# Patient Record
Sex: Female | Born: 1935 | Race: White | Hispanic: No | Smoking: Never smoker
Health system: Southern US, Community
[De-identification: ages and names within clinical notes are randomized; demographics above are authoritative.]

## PROBLEM LIST (undated history)

## (undated) DIAGNOSIS — G2581 Restless legs syndrome: Secondary | ICD-10-CM

## (undated) DIAGNOSIS — J310 Chronic rhinitis: Secondary | ICD-10-CM

## (undated) DIAGNOSIS — R269 Unspecified abnormalities of gait and mobility: Secondary | ICD-10-CM

## (undated) DIAGNOSIS — D62 Acute posthemorrhagic anemia: Secondary | ICD-10-CM

## (undated) DIAGNOSIS — I872 Venous insufficiency (chronic) (peripheral): Secondary | ICD-10-CM

## (undated) DIAGNOSIS — M810 Age-related osteoporosis without current pathological fracture: Secondary | ICD-10-CM

## (undated) DIAGNOSIS — R413 Other amnesia: Secondary | ICD-10-CM

## (undated) DIAGNOSIS — R209 Unspecified disturbances of skin sensation: Secondary | ICD-10-CM

## (undated) DIAGNOSIS — S2239XA Fracture of one rib, unspecified side, initial encounter for closed fracture: Secondary | ICD-10-CM

## (undated) DIAGNOSIS — R5381 Other malaise: Secondary | ICD-10-CM

## (undated) DIAGNOSIS — S2249XA Multiple fractures of ribs, unspecified side, initial encounter for closed fracture: Secondary | ICD-10-CM

## (undated) DIAGNOSIS — S72009A Fracture of unspecified part of neck of unspecified femur, initial encounter for closed fracture: Secondary | ICD-10-CM

## (undated) DIAGNOSIS — K219 Gastro-esophageal reflux disease without esophagitis: Secondary | ICD-10-CM

## (undated) DIAGNOSIS — R634 Abnormal weight loss: Secondary | ICD-10-CM

## (undated) DIAGNOSIS — R42 Dizziness and giddiness: Secondary | ICD-10-CM

## (undated) DIAGNOSIS — J301 Allergic rhinitis due to pollen: Secondary | ICD-10-CM

## (undated) DIAGNOSIS — D649 Anemia, unspecified: Secondary | ICD-10-CM

## (undated) DIAGNOSIS — Z9181 History of falling: Secondary | ICD-10-CM

## (undated) DIAGNOSIS — R079 Chest pain, unspecified: Secondary | ICD-10-CM

## (undated) DIAGNOSIS — G479 Sleep disorder, unspecified: Secondary | ICD-10-CM

## (undated) DIAGNOSIS — C801 Malignant (primary) neoplasm, unspecified: Secondary | ICD-10-CM

## (undated) DIAGNOSIS — I10 Essential (primary) hypertension: Secondary | ICD-10-CM

## (undated) DIAGNOSIS — M170 Bilateral primary osteoarthritis of knee: Secondary | ICD-10-CM

## (undated) DIAGNOSIS — G609 Hereditary and idiopathic neuropathy, unspecified: Secondary | ICD-10-CM

## (undated) DIAGNOSIS — R32 Unspecified urinary incontinence: Secondary | ICD-10-CM

## (undated) DIAGNOSIS — M1990 Unspecified osteoarthritis, unspecified site: Secondary | ICD-10-CM

## (undated) DIAGNOSIS — S72143A Displaced intertrochanteric fracture of unspecified femur, initial encounter for closed fracture: Secondary | ICD-10-CM

## (undated) DIAGNOSIS — M24549 Contracture, unspecified hand: Secondary | ICD-10-CM

## (undated) DIAGNOSIS — E785 Hyperlipidemia, unspecified: Secondary | ICD-10-CM

## (undated) DIAGNOSIS — K209 Esophagitis, unspecified: Secondary | ICD-10-CM

## (undated) DIAGNOSIS — S90129A Contusion of unspecified lesser toe(s) without damage to nail, initial encounter: Secondary | ICD-10-CM

## (undated) DIAGNOSIS — F329 Major depressive disorder, single episode, unspecified: Secondary | ICD-10-CM

## (undated) DIAGNOSIS — M4120 Other idiopathic scoliosis, site unspecified: Secondary | ICD-10-CM

## (undated) DIAGNOSIS — K2210 Ulcer of esophagus without bleeding: Secondary | ICD-10-CM

## (undated) DIAGNOSIS — K449 Diaphragmatic hernia without obstruction or gangrene: Secondary | ICD-10-CM

## (undated) DIAGNOSIS — C50919 Malignant neoplasm of unspecified site of unspecified female breast: Secondary | ICD-10-CM

## (undated) DIAGNOSIS — I729 Aneurysm of unspecified site: Secondary | ICD-10-CM

## (undated) HISTORY — DX: Ulcer of esophagus without bleeding: K22.10

## (undated) HISTORY — PX: TONSILLECTOMY: SUR1361

## (undated) HISTORY — PX: ESOPHAGOGASTRODUODENOSCOPY ENDOSCOPY: SHX5814

## (undated) HISTORY — DX: Multiple fractures of ribs, unspecified side, initial encounter for closed fracture: S22.49XA

## (undated) HISTORY — DX: Malignant (primary) neoplasm, unspecified: C80.1

## (undated) HISTORY — DX: Unspecified abnormalities of gait and mobility: R26.9

## (undated) HISTORY — PX: APPENDECTOMY: SHX54

## (undated) HISTORY — DX: Contracture, unspecified hand: M24.549

## (undated) HISTORY — DX: Esophagitis, unspecified: K20.9

## (undated) HISTORY — DX: Other idiopathic scoliosis, site unspecified: M41.20

## (undated) HISTORY — DX: History of falling: Z91.81

## (undated) HISTORY — DX: Contusion of unspecified lesser toe(s) without damage to nail, initial encounter: S90.129A

## (undated) HISTORY — PX: HIP FRACTURE SURGERY: SHX118

## (undated) HISTORY — DX: Sleep disorder, unspecified: G47.9

## (undated) HISTORY — DX: Bilateral primary osteoarthritis of knee: M17.0

## (undated) HISTORY — DX: Unspecified urinary incontinence: R32

## (undated) HISTORY — DX: Restless legs syndrome: G25.81

## (undated) HISTORY — PX: ANAL FISSURE REPAIR: SHX2312

## (undated) HISTORY — PX: VARICOSE VEIN SURGERY: SHX832

## (undated) HISTORY — DX: Anemia, unspecified: D64.9

## (undated) HISTORY — DX: Malignant neoplasm of unspecified site of unspecified female breast: C50.919

## (undated) HISTORY — PX: OTHER SURGICAL HISTORY: SHX169

## (undated) HISTORY — DX: Dizziness and giddiness: R42

## (undated) HISTORY — DX: Essential (primary) hypertension: I10

## (undated) HISTORY — DX: Unspecified disturbances of skin sensation: R20.9

## (undated) HISTORY — PX: HAND SURGERY: SHX662

## (undated) HISTORY — DX: Aneurysm of unspecified site: I72.9

## (undated) HISTORY — DX: Abnormal weight loss: R63.4

## (undated) HISTORY — DX: Acute posthemorrhagic anemia: D62

## (undated) HISTORY — DX: Allergic rhinitis due to pollen: J30.1

## (undated) HISTORY — DX: Venous insufficiency (chronic) (peripheral): I87.2

## (undated) HISTORY — DX: Major depressive disorder, single episode, unspecified: F32.9

## (undated) HISTORY — DX: Other amnesia: R41.3

## (undated) HISTORY — DX: Hyperlipidemia, unspecified: E78.5

## (undated) HISTORY — DX: Fracture of one rib, unspecified side, initial encounter for closed fracture: S22.39XA

## (undated) HISTORY — DX: Fracture of unspecified part of neck of unspecified femur, initial encounter for closed fracture: S72.009A

## (undated) HISTORY — DX: Gastro-esophageal reflux disease without esophagitis: K21.9

## (undated) HISTORY — DX: Unspecified osteoarthritis, unspecified site: M19.90

## (undated) HISTORY — DX: Age-related osteoporosis without current pathological fracture: M81.0

## (undated) HISTORY — DX: Displaced intertrochanteric fracture of unspecified femur, initial encounter for closed fracture: S72.143A

## (undated) HISTORY — DX: Diaphragmatic hernia without obstruction or gangrene: K44.9

## (undated) HISTORY — DX: Chest pain, unspecified: R07.9

## (undated) HISTORY — DX: Other malaise: R53.81

## (undated) HISTORY — DX: Chronic rhinitis: J31.0

## (undated) HISTORY — DX: Hereditary and idiopathic neuropathy, unspecified: G60.9

---

## 1984-03-10 DIAGNOSIS — C50919 Malignant neoplasm of unspecified site of unspecified female breast: Secondary | ICD-10-CM

## 1984-03-10 HISTORY — PX: BREAST SURGERY: SHX581

## 1984-03-10 HISTORY — DX: Malignant neoplasm of unspecified site of unspecified female breast: C50.919

## 1997-07-04 ENCOUNTER — Other Ambulatory Visit: Admission: RE | Admit: 1997-07-04 | Discharge: 1997-07-04 | Payer: Self-pay | Admitting: Obstetrics and Gynecology

## 1998-03-10 HISTORY — PX: CATARACT EXTRACTION W/ INTRAOCULAR LENS IMPLANT: SHX1309

## 1998-03-10 HISTORY — PX: TONGUE SURGERY: SHX810

## 1998-04-20 ENCOUNTER — Other Ambulatory Visit: Admission: RE | Admit: 1998-04-20 | Discharge: 1998-04-20 | Payer: Self-pay | Admitting: Obstetrics and Gynecology

## 1998-05-31 ENCOUNTER — Other Ambulatory Visit: Admission: RE | Admit: 1998-05-31 | Discharge: 1998-05-31 | Payer: Self-pay | Admitting: Gastroenterology

## 1998-06-20 ENCOUNTER — Emergency Department (HOSPITAL_COMMUNITY): Admission: EM | Admit: 1998-06-20 | Discharge: 1998-06-20 | Payer: Self-pay | Admitting: Emergency Medicine

## 1998-06-22 ENCOUNTER — Ambulatory Visit (HOSPITAL_BASED_OUTPATIENT_CLINIC_OR_DEPARTMENT_OTHER): Admission: RE | Admit: 1998-06-22 | Discharge: 1998-06-22 | Payer: Self-pay | Admitting: Orthopedic Surgery

## 1998-09-25 ENCOUNTER — Ambulatory Visit (HOSPITAL_COMMUNITY): Admission: RE | Admit: 1998-09-25 | Discharge: 1998-09-25 | Payer: Self-pay | Admitting: Ophthalmology

## 2000-04-21 ENCOUNTER — Other Ambulatory Visit: Admission: RE | Admit: 2000-04-21 | Discharge: 2000-04-21 | Payer: Self-pay | Admitting: Obstetrics and Gynecology

## 2000-08-14 ENCOUNTER — Encounter: Payer: Self-pay | Admitting: Obstetrics and Gynecology

## 2000-08-14 ENCOUNTER — Encounter: Admission: RE | Admit: 2000-08-14 | Discharge: 2000-08-14 | Payer: Self-pay | Admitting: Obstetrics and Gynecology

## 2000-10-28 ENCOUNTER — Encounter: Admission: RE | Admit: 2000-10-28 | Discharge: 2000-10-28 | Payer: Self-pay | Admitting: Gastroenterology

## 2000-10-28 ENCOUNTER — Encounter: Payer: Self-pay | Admitting: Gastroenterology

## 2001-06-22 ENCOUNTER — Other Ambulatory Visit: Admission: RE | Admit: 2001-06-22 | Discharge: 2001-06-22 | Payer: Self-pay | Admitting: Obstetrics and Gynecology

## 2002-01-27 ENCOUNTER — Encounter: Admission: RE | Admit: 2002-01-27 | Discharge: 2002-01-27 | Payer: Self-pay | Admitting: Obstetrics and Gynecology

## 2002-01-27 ENCOUNTER — Encounter: Payer: Self-pay | Admitting: Obstetrics and Gynecology

## 2002-07-22 ENCOUNTER — Other Ambulatory Visit: Admission: RE | Admit: 2002-07-22 | Discharge: 2002-07-22 | Payer: Self-pay | Admitting: Obstetrics and Gynecology

## 2003-03-17 ENCOUNTER — Encounter (INDEPENDENT_AMBULATORY_CARE_PROVIDER_SITE_OTHER): Payer: Self-pay | Admitting: Specialist

## 2003-03-17 ENCOUNTER — Ambulatory Visit (HOSPITAL_COMMUNITY): Admission: RE | Admit: 2003-03-17 | Discharge: 2003-03-17 | Payer: Self-pay | Admitting: *Deleted

## 2003-08-14 ENCOUNTER — Other Ambulatory Visit: Admission: RE | Admit: 2003-08-14 | Discharge: 2003-08-14 | Payer: Self-pay | Admitting: Obstetrics and Gynecology

## 2003-08-16 ENCOUNTER — Encounter: Admission: RE | Admit: 2003-08-16 | Discharge: 2003-08-16 | Payer: Self-pay | Admitting: Obstetrics and Gynecology

## 2003-10-09 ENCOUNTER — Encounter (HOSPITAL_COMMUNITY): Admission: RE | Admit: 2003-10-09 | Discharge: 2003-10-19 | Payer: Self-pay | Admitting: Obstetrics and Gynecology

## 2004-04-24 ENCOUNTER — Ambulatory Visit: Payer: Self-pay | Admitting: Gastroenterology

## 2004-06-24 ENCOUNTER — Ambulatory Visit: Payer: Self-pay | Admitting: Gastroenterology

## 2004-07-04 ENCOUNTER — Ambulatory Visit: Payer: Self-pay | Admitting: Gastroenterology

## 2004-07-15 ENCOUNTER — Ambulatory Visit (HOSPITAL_COMMUNITY): Admission: RE | Admit: 2004-07-15 | Discharge: 2004-07-15 | Payer: Self-pay | Admitting: Neurology

## 2004-09-20 ENCOUNTER — Emergency Department (HOSPITAL_COMMUNITY): Admission: EM | Admit: 2004-09-20 | Discharge: 2004-09-20 | Payer: Self-pay | Admitting: Family Medicine

## 2004-10-02 ENCOUNTER — Other Ambulatory Visit: Admission: RE | Admit: 2004-10-02 | Discharge: 2004-10-02 | Payer: Self-pay | Admitting: Obstetrics and Gynecology

## 2004-10-07 ENCOUNTER — Encounter: Admission: RE | Admit: 2004-10-07 | Discharge: 2004-10-07 | Payer: Self-pay | Admitting: Obstetrics and Gynecology

## 2005-02-03 ENCOUNTER — Ambulatory Visit: Payer: Self-pay | Admitting: Gastroenterology

## 2005-08-27 ENCOUNTER — Ambulatory Visit: Payer: Self-pay | Admitting: Gastroenterology

## 2005-10-14 ENCOUNTER — Encounter: Admission: RE | Admit: 2005-10-14 | Discharge: 2005-10-14 | Payer: Self-pay | Admitting: Obstetrics and Gynecology

## 2005-10-22 ENCOUNTER — Other Ambulatory Visit: Admission: RE | Admit: 2005-10-22 | Discharge: 2005-10-22 | Payer: Self-pay | Admitting: Obstetrics and Gynecology

## 2006-10-08 ENCOUNTER — Encounter: Admission: RE | Admit: 2006-10-08 | Discharge: 2006-10-08 | Payer: Self-pay | Admitting: Obstetrics and Gynecology

## 2006-10-20 ENCOUNTER — Other Ambulatory Visit: Admission: RE | Admit: 2006-10-20 | Discharge: 2006-10-20 | Payer: Self-pay | Admitting: Obstetrics and Gynecology

## 2006-10-30 ENCOUNTER — Emergency Department (HOSPITAL_COMMUNITY): Admission: EM | Admit: 2006-10-30 | Discharge: 2006-10-30 | Payer: Self-pay | Admitting: Emergency Medicine

## 2006-12-15 ENCOUNTER — Encounter: Admission: RE | Admit: 2006-12-15 | Discharge: 2006-12-15 | Payer: Self-pay | Admitting: Obstetrics and Gynecology

## 2006-12-30 ENCOUNTER — Ambulatory Visit: Payer: Self-pay | Admitting: Vascular Surgery

## 2007-02-12 ENCOUNTER — Ambulatory Visit: Payer: Self-pay | Admitting: Vascular Surgery

## 2007-04-17 ENCOUNTER — Emergency Department (HOSPITAL_COMMUNITY): Admission: EM | Admit: 2007-04-17 | Discharge: 2007-04-17 | Payer: Self-pay | Admitting: Emergency Medicine

## 2007-04-20 ENCOUNTER — Ambulatory Visit (HOSPITAL_BASED_OUTPATIENT_CLINIC_OR_DEPARTMENT_OTHER): Admission: RE | Admit: 2007-04-20 | Discharge: 2007-04-20 | Payer: Self-pay | Admitting: Orthopedic Surgery

## 2007-05-13 ENCOUNTER — Ambulatory Visit (HOSPITAL_BASED_OUTPATIENT_CLINIC_OR_DEPARTMENT_OTHER): Admission: RE | Admit: 2007-05-13 | Discharge: 2007-05-13 | Payer: Self-pay | Admitting: Orthopedic Surgery

## 2007-07-05 ENCOUNTER — Encounter: Admission: RE | Admit: 2007-07-05 | Discharge: 2007-07-05 | Payer: Self-pay | Admitting: Family Medicine

## 2007-07-28 ENCOUNTER — Encounter: Admission: RE | Admit: 2007-07-28 | Discharge: 2007-07-28 | Payer: Self-pay | Admitting: Family Medicine

## 2007-10-13 ENCOUNTER — Encounter: Admission: RE | Admit: 2007-10-13 | Discharge: 2007-10-13 | Payer: Self-pay | Admitting: Obstetrics and Gynecology

## 2007-12-03 ENCOUNTER — Ambulatory Visit: Payer: Self-pay | Admitting: Vascular Surgery

## 2007-12-22 ENCOUNTER — Encounter: Admission: RE | Admit: 2007-12-22 | Discharge: 2007-12-22 | Payer: Self-pay | Admitting: Geriatric Medicine

## 2008-01-17 ENCOUNTER — Ambulatory Visit (HOSPITAL_COMMUNITY): Admission: RE | Admit: 2008-01-17 | Discharge: 2008-01-17 | Payer: Self-pay | Admitting: Vascular Surgery

## 2008-01-17 ENCOUNTER — Ambulatory Visit: Payer: Self-pay | Admitting: Vascular Surgery

## 2008-01-17 ENCOUNTER — Encounter: Payer: Self-pay | Admitting: Vascular Surgery

## 2008-02-11 ENCOUNTER — Ambulatory Visit: Payer: Self-pay | Admitting: Vascular Surgery

## 2008-02-14 ENCOUNTER — Ambulatory Visit: Payer: Self-pay | Admitting: Obstetrics and Gynecology

## 2008-03-16 ENCOUNTER — Encounter: Admission: RE | Admit: 2008-03-16 | Discharge: 2008-03-16 | Payer: Self-pay | Admitting: Obstetrics and Gynecology

## 2008-03-31 ENCOUNTER — Ambulatory Visit: Payer: Self-pay | Admitting: Obstetrics and Gynecology

## 2008-06-26 ENCOUNTER — Ambulatory Visit: Payer: Self-pay | Admitting: Obstetrics and Gynecology

## 2008-07-18 ENCOUNTER — Ambulatory Visit (HOSPITAL_COMMUNITY): Admission: RE | Admit: 2008-07-18 | Discharge: 2008-07-18 | Payer: Self-pay | Admitting: Orthopaedic Surgery

## 2008-08-28 ENCOUNTER — Ambulatory Visit (HOSPITAL_COMMUNITY): Admission: RE | Admit: 2008-08-28 | Discharge: 2008-08-28 | Payer: Self-pay | Admitting: Neurology

## 2008-09-20 ENCOUNTER — Ambulatory Visit (HOSPITAL_COMMUNITY): Admission: RE | Admit: 2008-09-20 | Discharge: 2008-09-20 | Payer: Self-pay | Admitting: Geriatric Medicine

## 2008-10-15 ENCOUNTER — Ambulatory Visit (HOSPITAL_COMMUNITY): Admission: RE | Admit: 2008-10-15 | Discharge: 2008-10-15 | Payer: Self-pay | Admitting: Family Medicine

## 2008-10-15 ENCOUNTER — Encounter (INDEPENDENT_AMBULATORY_CARE_PROVIDER_SITE_OTHER): Payer: Self-pay | Admitting: Family Medicine

## 2008-10-15 ENCOUNTER — Ambulatory Visit: Payer: Self-pay | Admitting: *Deleted

## 2008-10-18 ENCOUNTER — Encounter: Admission: RE | Admit: 2008-10-18 | Discharge: 2008-10-18 | Payer: Self-pay | Admitting: Obstetrics and Gynecology

## 2008-10-23 ENCOUNTER — Ambulatory Visit: Payer: Self-pay | Admitting: Obstetrics and Gynecology

## 2008-10-23 ENCOUNTER — Encounter: Payer: Self-pay | Admitting: Obstetrics and Gynecology

## 2008-10-23 ENCOUNTER — Other Ambulatory Visit: Admission: RE | Admit: 2008-10-23 | Discharge: 2008-10-23 | Payer: Self-pay | Admitting: Obstetrics and Gynecology

## 2008-10-24 ENCOUNTER — Ambulatory Visit: Payer: Self-pay | Admitting: Obstetrics and Gynecology

## 2008-10-26 ENCOUNTER — Ambulatory Visit (HOSPITAL_COMMUNITY): Admission: RE | Admit: 2008-10-26 | Discharge: 2008-10-26 | Payer: Self-pay | Admitting: Obstetrics and Gynecology

## 2008-12-26 ENCOUNTER — Ambulatory Visit: Payer: Self-pay | Admitting: Obstetrics and Gynecology

## 2009-01-02 ENCOUNTER — Encounter
Admission: RE | Admit: 2009-01-02 | Discharge: 2009-03-06 | Payer: Self-pay | Admitting: Physical Medicine and Rehabilitation

## 2009-01-03 ENCOUNTER — Ambulatory Visit: Payer: Self-pay | Admitting: Obstetrics and Gynecology

## 2009-01-11 ENCOUNTER — Ambulatory Visit (HOSPITAL_COMMUNITY): Admission: RE | Admit: 2009-01-11 | Discharge: 2009-01-11 | Payer: Self-pay | Admitting: Obstetrics and Gynecology

## 2009-02-12 ENCOUNTER — Encounter: Admission: RE | Admit: 2009-02-12 | Discharge: 2009-02-12 | Payer: Self-pay | Admitting: Geriatric Medicine

## 2009-04-24 ENCOUNTER — Encounter: Admission: RE | Admit: 2009-04-24 | Discharge: 2009-07-23 | Payer: Self-pay | Admitting: Otolaryngology

## 2009-05-18 ENCOUNTER — Encounter (INDEPENDENT_AMBULATORY_CARE_PROVIDER_SITE_OTHER): Payer: Self-pay | Admitting: *Deleted

## 2009-09-12 ENCOUNTER — Ambulatory Visit: Payer: Self-pay | Admitting: Obstetrics and Gynecology

## 2009-09-17 ENCOUNTER — Encounter: Admission: RE | Admit: 2009-09-17 | Discharge: 2009-09-17 | Payer: Self-pay | Admitting: Obstetrics and Gynecology

## 2009-10-31 ENCOUNTER — Ambulatory Visit: Payer: Self-pay | Admitting: Obstetrics and Gynecology

## 2009-12-31 ENCOUNTER — Ambulatory Visit: Payer: Self-pay | Admitting: Obstetrics and Gynecology

## 2010-01-14 ENCOUNTER — Ambulatory Visit (HOSPITAL_COMMUNITY): Admission: RE | Admit: 2010-01-14 | Discharge: 2010-01-14 | Payer: Self-pay | Admitting: Obstetrics and Gynecology

## 2010-03-10 DIAGNOSIS — F3289 Other specified depressive episodes: Secondary | ICD-10-CM

## 2010-03-10 DIAGNOSIS — F329 Major depressive disorder, single episode, unspecified: Secondary | ICD-10-CM

## 2010-03-10 HISTORY — DX: Major depressive disorder, single episode, unspecified: F32.9

## 2010-03-10 HISTORY — DX: Other specified depressive episodes: F32.89

## 2010-03-13 ENCOUNTER — Telehealth (INDEPENDENT_AMBULATORY_CARE_PROVIDER_SITE_OTHER): Payer: Self-pay | Admitting: *Deleted

## 2010-03-21 ENCOUNTER — Encounter
Admission: RE | Admit: 2010-03-21 | Discharge: 2010-03-21 | Payer: Self-pay | Source: Home / Self Care | Attending: Obstetrics and Gynecology | Admitting: Obstetrics and Gynecology

## 2010-04-11 NOTE — Progress Notes (Signed)
Summary: Change In GI MD  Phone Note Outgoing Call Call back at [facility] Phone [phone]   Call placed by: Harlow Mares CMA (AAMA),  March 13, 2010 8:30 AM Call placed to: Patient Summary of Call: patient aware that she is due for a colonoscopy and has already had this done with another GI MD. Initial call taken by: Harlow Mares CMA (AAMA),  March 13, 2010 8:30 AM

## 2010-04-11 NOTE — Letter (Signed)
Summary: Colonoscopy Letter  [facility]  [address] [place], [place] [address]   Phone: [phone]  Fax: [phone]      May 18, 2009 MRN: [id number]   [facility] [address] [place], [place]  [address]   Dear Ms. Teehan,   According to your medical record, it is time for you to schedule a Colonoscopy. The American Cancer Society recommends this procedure as a method to detect early colon cancer. Patients with a family history of colon cancer, or a personal history of colon polyps or inflammatory bowel disease are at increased risk.  This letter has been generated based on the recommendations made at the time of your procedure. If you feel that in your particular situation this may no longer apply, please contact our office.  Please call our office at [phone] to schedule this appointment or to update your records at your earliest convenience.  Thank you for cooperating with [place] to provide you with the very best care possible.   Sincerely,  Wilhemina Bonito. Marina Goodell, M.D.  [facility] [phone]

## 2010-06-17 LAB — CREATININE, SERUM: GFR calc non Af Amer: >60 mL/min (ref >60)

## 2010-07-23 NOTE — Op Note (Signed)
NAME:  Isabella George, Isabella George NO.:  [id number]   MEDICAL RECORD NO.:  [id number]          PATIENT TYPE:  AMB   LOCATION:  DSC                          FACILITY:  [facility]   PHYSICIAN:  Cindee Salt, M.D.       DATE OF BIRTH:  Dec 13, 1932   DATE OF PROCEDURE:  05/13/2007  DATE OF DISCHARGE:                               OPERATIVE REPORT   PREOPERATIVE DIAGNOSIS:  Status post intramedullary rod fixation, fifth  metacarpal, left hand with loss of fixation.   POSTOPERATIVE DIAGNOSIS:  Status post intramedullary rod fixation, fifth  metacarpal, left hand with loss of fixation.   OPERATION:  Removal of intramedullary rod with fixation of fifth  metacarpal fracture, left hand using modular hand plate and  interfragmentary screws.   SURGEON:  Cindee Salt, M.D.   ASSISTANT:  Carolyne Fiscal R.N.   ANESTHESIA:  Supraclavicular block with supplementation.   ANESTHESIOLOGIST:  Kaylyn Layer. Michelle Piper, M.D.   HISTORY:  The patient is a 77 year old female who suffered a fracture of  her fifth metacarpal.  She underwent IM rod fixation closed.  This has  lost reduction.  She is admitted now for removal of the IM rod, open  reduction internal fixation with plate, left little finger metacarpal.   SURGEON:  Kuzma for both procedures.   PROCEDURE:  In the preoperative area the patient is seen.  The extremity  marked by both the patient and surgeon.  She is aware of risks and  complications including infection, loss of fixation, fracture nonunion,  injury to arteries, nerves, tendons, incomplete reduction.  She is  desirous of proceeding.  Questions have been encouraged and answered.  The extremity marked by both the patient and surgeon.  Antibiotic given.  The patient was taken to the operating room where a supraclavicular  block was carried out without difficulty.  She was prepped using  DuraPrep, supine position.  On testing, she had feeling.  A local  anesthetic was then added with 0.25% Marcaine  without epinephrine.  A  time-out was performed identifying the patient and procedure and  location.  The limb was exsanguinated with an Esmarch bandage.  Tourniquet placed high on the arm was inflated to 250 mmHg.  A  longitudinal incision was made over the dorsum of the fifth metacarpal,  left hand, carried down through subcutaneous tissue.  Bleeders were  electrocauterized.  Dorsal sensory, ulnar nerve was identified and  protected.  Local infiltration was given in the periosteum.  The IM rod  was located proximally.  This was removed without difficulty.  Periosteum was incised.  The fracture was immediately apparent.  The  periosteum elevated.  The fracture was manipulated after removal of the  ingrowing callus and granulation tissue.  The fracture was then reduced.  A inner fragmentary screw was then placed with a 1.1-mm drill bit.  After reduction a 1.5 mm screw was then inserted.  A more distal pin was  placed with a 28 K-wire.  This stabilized fracture in position.  A H  modular eight-hole plate was then selected.  This was bent to contour to  the metacarpal shaft and head.  The fracture was extremely distal.  The  plate was then placed about the pin.  This was then fixed distally into  the metacarpal head with 14 and 16 mm screw.  The proximal plate was  then inserted 9 mm and 10 mm screws were placed.  On completion of the  placement of each of the screws through the plate the 28 K-wire was  removed and replaced after drilling with a 1.1-mm drill bit with a 10 mm  interfragmentary screw.  X-rays confirmed positioning of the fracture  fragment.  The reduction, pinning was done with the fingers fully flexed  to maintain rotation.  Complete flexion/extension of the finger was  available without any scissoring or overlap.  The wound was copiously  irrigated with saline.  The periosteum closed with horizontal mattress 5-  0 Vicryl sutures.  The incision which was made between the  two  components of the extensor tendon, extensor digitorum communis, extensor  digiti quinti was repaired with figure-of-eight 5-0 Vicryl sutures.  Noted that the entire plate was able to be closed by periosteum.  The  subcutaneous tissue was then closed with interrupted 5-0 Vicryl and the  skin with interrupted 5-0 Vicryl Rapide sutures.  Sterile compressive  dressing, splint to the ring and little finger applied.  The patient  tolerated the procedure well and was taken to the recovery room for  observation in satisfactory condition.  She will be discharged home to  return to the [facility] in one week on Vicodin.           ______________________________  Cindee Salt, M.D.     GK/MEDQ  D:  05/13/2007  T:  05/13/2007  Job:  [id number]

## 2010-07-23 NOTE — Assessment & Plan Note (Signed)
OFFICE VISIT   KALANI, BARAY  DOB:  13-Aug-1932                                       12/30/2006  ZOXWR#:[id number]   Patient presents for evaluation of left leg venous pathology.  She is  status post laser ablation of her right great saphenous vein by Dr.  Hart Rochester.  She did well with this with no new problems on her right leg.  She does have ongoing difficulty with pain and a heavy sensation in her  left leg.  She does have swelling as well.  She does not have any  history of left leg deep venous thrombosis.  She has pain associated  with her thigh and calf and also has discomfort in her groin.  She has a  mass in this area and was actually seen by general surgery with concern  that this may be a hernia.  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  This was confirmed with  ultrasound.  This does extend into large varicosities over her lateral  thigh.   I explained that this very large varix makes her not a candidate for  laser ablation of this area, since this area is over 2 cm.  I explained  treatment to her, which would be required as an inpatient with a small  groin incision for ligation of her large varix at the saphenofemoral  junction and stab phlebectomy of the large tributary varicosities over  her lateral thigh.  She understands this will be done as an outpatient.  We will schedule this at her convenience as an outpatient at [facility].   Larina Earthly, M.D.  Electronically Signed   TFE/MEDQ  D:  01/06/2007  T:  01/07/2007  Job:  [id number]

## 2010-07-23 NOTE — Assessment & Plan Note (Signed)
OFFICE VISIT   DAILEY, ALBERSON  DOB:  [birth year]                                       02/12/2007  ZOXWR#:[id number]   The patient presents for a continued discussion of her left leg venous  varicosities.  I had seen her in October and she is requesting further  discussion for clarification.  She does have a large varix coming off of  her left common femoral vein, associated with anterior branch of her  saphenous vein.  This extends to the very large reticular varicosity  that extends throughout her anterior thigh, lateral knee, and lateral  calf.  The varix in her groin was actually greater than 2 cm in diameter  and I explained it is not amenable to laser therapy due to this.  We  have recommended treatment of this as an outpatient at the hospital with  an incision over her groin to remove the large aneurismal venous varix  and stab phlebectomies of multiple tributary varicosities throughout her  thigh and calf.  I explained that the indication for this would be pain,  and she reports she is having significant pain with this.  I explained  that this is not limb- or life-threatening, and would be completely at  her discretion.  She wishes to proceed after the first of the year, and  will contact our office when she wishes to schedule surgery.   Larina Earthly, M.D.  Electronically Signed   TFE/MEDQ  D:  02/12/2007  T:  02/15/2007  Job:  [id number]

## 2010-07-23 NOTE — Assessment & Plan Note (Signed)
OFFICE VISIT   Isabella George, Isabella George  DOB:  04-01-1932                                       12/03/2007  [id number]#:[id number]   The patient presents today for continued discussion of her extensive  varicosities in her left leg.  She reports these are continuing to cause  her discomfort.  She does have rather huge varicosities extending from  her left groin across her thigh to her lateral left knee extending down  onto her calf.  Prior duplex had shown a venous aneurysm in the  saphenous vein near her takeoff of the femoral vein and large  varicosities arising from it.  I have discussed options with her before  and explained that we would recommend outpatient surgery for resection  of this venous aneurysm and stab phlebectomy of her multiple tributary  varicosities.  For unknown reasons, she has complete aversion to going  to [facility].  She questions whether this can be done at  [facility] or at [facility] [place] and I explained that we practice  only at [facility] and do not practice at [facility].  She  reported that she had actually traveled to [facility] for second  opinion, and apparently I do not have these records, but was told by  [facility] the exact same treatment option for resection of the  venous aneurysm and stab phlebectomy.  I explained to her that this is  not a dangerous situation that does preclude her to superficial  thrombophlebitis, but should not put her at increased risk for deep vein  thrombosis or more serious medical complications.  I explained to her  that this will be slowly progressive over time regarding enlargement of  the varicosities.  She appreciated the discussion and will continue to  consider her options.  She will see [place] as needed.   Larina Earthly, M.D.  Electronically Signed   TFE/MEDQ  D:  12/03/2007  T:  12/06/2007  Job:  [id number]   cc:   Hal T. Stoneking, M.D.

## 2010-07-23 NOTE — Assessment & Plan Note (Signed)
OFFICE VISIT   Isabella George, Isabella George  DOB:  10/09/1932                                       02/11/2008  NFAOZ#:[id number]   The patient presents today to follow up of her resection of saphenous  vein venous from mid thigh to saphenofemoral junction and multiple stab  phlebectomies over her lateral thigh.  This was done as an outpatient at  [facility] on 01/17/2008.  She is quite pleased with her result  and reports minimal discomfort following this.  She had been quite  anxious to proceed with this prior to surgery.  She had had a prior  ablation of the contralateral saphenous vein by Dr. Hart Rochester.  Her  incisions are all healing quite nicely.  She does have some thickening  in the areas where she continues to have some thrombus at the  phlebectomy site.  Her groin incision is completely healed.  She is  quite pleased with the result, as am I, and she will see [place] again on an  as needed basis.   Larina Earthly, M.D.  Electronically Signed   TFE/MEDQ  D:  02/11/2008  T:  02/14/2008  Job:  [id number]   cc:   Hal T. Stoneking, M.D.

## 2010-07-23 NOTE — Op Note (Signed)
NAME:  Isabella George, Isabella George               ACCOUNT NO.:  [id number]   MEDICAL RECORD NO.:  [id number]          PATIENT TYPE:  AMB   LOCATION:  SDS                          FACILITY:  [facility]   PHYSICIAN:  Larina Earthly, M.D.    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   POSTOPERATIVE DIAGNOSIS:  Venous hypertension with venous aneurysm of  the proximal saphenous vein and multiple tributary varicosities over her  lateral thigh, knee, and calf.   PROCEDURE:  Resection of saphenous vein venous aneurysm and resection of  saphenous vein from midthigh to the saphenofemoral junction followed by  multiple stab phlebectomies of tributary varicosities greater than 20.   SURGEON:  Larina Earthly, MD   ASSISTANT:  Nurse.   ANESTHESIA:  LMA.   COMPLICATIONS:  None.   DISPOSITION:  To recovery room, stable.   PROCEDURE IN DETAIL:  The patient was taken to operating room, initially  was stood where the area of the marked tributary varicosities over her  anterior thigh, lateral thigh, and lateral knee and calf were marked.  The patient was placed in supine position and the groin and left leg  were prepped and draped in usual sterile fashion.  An incision was made  over the saphenofemoral junction and carried down to isolate the large  venous aneurysm arising from the saphenous femoral junction, which had  branch of the anterior saphenous vein.  The saphenofemoral junction was  identified.  Tributary branches of the saphenofemoral junction were  ligated with 2-0 and 3-0 silk ties and divided.  The junction of the  saphenous vein to the common femoral vein was occluded with a Gregory  profunda clamp.  The vein was controlled  distally with vessel loop.  A  stripper was passed down to the level of the takeoff of the tributary  varicosities in mid thigh.  The saphenous vein was excised from the  saphenofemoral junction, the saphenofemoral junction was oversewn with 2  layers of 5-0 Prolene suture.  The separate small incision was made over  the medial thigh at the level of the end to the stripper at the takeoff  of the large tributary varicosities.  The saphenous vein was identified  at this position and with strippers brought out through the vein and was  secured to the stripper.  A small stripper head was placed in the  saphenous vein from midthigh to groin was removed.  Pressure was held  for hemostasis.  Next, using a stab phlebectomy technique, greater than  20 incisions were made over the anterior thigh, medial thigh, medial  knee, and medial calf.  The tributary varicosities were avulsed with  stab avulsion technique and pressure was held for hemostasis.  Once all  the tributaries were removed, the pressure was held for hemostasis,  placement was in Trendelenburg throughout the procedure.  The leg was  washed and Steri-  Strips were placed over the phlebectomy sites and also over the incision  in the groin.  A 4 x 4 and Kerlix were placed on the leg and two 6-inche  Coban dressings were placed from the foot to the groin for pressure.  The patient was transferred to the recovery room in stable condition.      Larina Earthly, M.D.  Electronically Signed     TFE/MEDQ  D:  01/17/2008  T:  01/17/2008  Job:  [id number]

## 2010-07-23 NOTE — Op Note (Signed)
NAME:  Isabella George, Isabella George NO.:  [id number]   MEDICAL RECORD NO.:  [id number]          PATIENT TYPE:  AMB   LOCATION:  DSC                          FACILITY:  [facility]   PHYSICIAN:  Cindee Salt, M.D.       DATE OF BIRTH:  Jun 26, 1932   DATE OF PROCEDURE:  04/20/2007  DATE OF DISCHARGE:  04/20/2007                               OPERATIVE REPORT   PREOPERATIVE DIAGNOSIS:  Fracture fifth metacarpal, left hand.   POSTOPERATIVE DIAGNOSIS:  Fracture fifth metacarpal, left hand.   OPERATION:  Reduction, intramedullary nailing, fifth metacarpal  __________ hand.   SURGEON:  Cindee Salt, M.D.   ASSISTANT:  None.   ANESTHESIA:  Axillary block.   ANESTHESIOLOGIST:  Burna Forts, M.D.   HISTORY:  The patient is a 77 year old female who suffered a fall with  fracture of fifth metacarpal with complete displacement.  She is  admitted now for closed possible open reduction, internal fixation of  fifth metacarpal left hand.  She is aware of risks and complications  including infection, loss of reduction, injury to arteries, nerves,  tendons, nonunion, failure of the fixation device, dystrophy, injury to  arteries, nerves, tendons, she has elected to proceed to undergo the  surgical intervention.  In preoperative area the patient is seen, the  extremity marked by both the patient and surgeon.  Antibiotic given.   PROCEDURE:  The patient is brought to the operating room where an  axillary block was carried out without difficulty.  She was prepped  using DuraPrep, left arm free.  The limb was elevated for  exsanguination, tourniquet placed high on the arm was inflated to 250  mmHg.  The fracture was reduced using the image intensifier.  This was  reducible so it was decided to proceed with IM nailing.  A 1.6-mm hand  innovations IM metacarpal rod was selected.  A drill hole was placed  after localization with the image intensifier.  An incision made,  carried down to the base of  the fifth metacarpal.  Retractors placed to  protect the dorsal sensory nerve and extensor tendons.  A drill hole was  then made in the base of the fifth metacarpal and a 1.6 mm IM rod was  then inserted through this.  This was then delivered distally down the  shaft of the fifth metacarpal.  The fracture was reduced, held in  position with full flexion of the fingers and passed into the metacarpal  head.  This was checked under image intensification.  AP, lateral,  oblique x-rays revealed the fracture was in a reduced position.  Wound  was irrigated.  The pin cut short beneath the skin.  The skin was then  closed with interrupted 5-0 Vicryl Rapide sutures.  A sterile  compressive dressing and ulnar gutter splint applied.  The patient  tolerated the procedure well, was taken to the recovery  observation in satisfactory condition.  She had some feeling on making  the incision.  The area was infiltrated with 1% Xylocaine without  epinephrine.  4 mL was used.  The patient tolerated the  procedure well.  She is discharged home to return to [facility] [place] in one  week on Vicodin.           ______________________________  Cindee Salt, M.D.     GK/MEDQ  D:  04/20/2007  T:  04/22/2007  Job:  [id number]

## 2010-07-26 NOTE — Op Note (Signed)
NAME:  [name], [name] NO.:  [id number]   MEDICAL RECORD NO.:  [id number]                   PATIENT TYPE:  OIB   LOCATION:  2899                                 FACILITY:  [facility]   PHYSICIAN:  Balinda Quails, M.D.                 DATE OF BIRTH:  1932/11/13   DATE OF PROCEDURE:  03/17/2003  DATE OF DISCHARGE:                                 OPERATIVE REPORT   PREOPERATIVE DIAGNOSIS:  Venous stasis ulcer, right lower extremity.   POSTOPERATIVE DIAGNOSIS:  Venous stasis ulcer, right lower extremity.   PROCEDURES:  1. Excision of venous stasis ulcer, right lower extremity.  2. Ligation of venous perforator.   SURGEON:  Balinda Quails, M.D.   ASSISTANT:  Nurse.   ANESTHESIA:  General endotracheal.   ANESTHESIOLOGIST:  Jean Rosenthal.   CLINICAL NOTE:  Isabella George is a 77 year old female with a chronic venous  stasis ulcer in the lateral right calf.  She has had two bleeding episodes  from this.  Duplex evaluation revealed a venous perforator deep to this.  She is brought to the operating room at this time for excision of venous  ulcer and ligation of perforator.   OPERATIVE PROCEDURE:  Patient brought to the operating room in stable  condition.  Placed in supine position.  Right leg prepped and draped in a  sterile fashion.  General endotracheal anesthesia induced.   An elliptical skin incision was made surrounding the right lower extremity  venous stasis ulcer.  Dissection carried down into the subcutaneous tissue.  The perforator was identified, followed down to the fascia.  The perforator  ligated at the fascia with 2-0 silk and divided.  The ulcer excised.  Primary closure carried out with interrupted 3-0  Vicryl suture in the subcutaneous tissue, interrupted 4-0 nylon in the skin.  Sterile dressing of 4 x 4's, Kerlix, and Ace wrap applied to the right leg.   No apparent complications.  The patient transferred to the recovery room in  stable  condition.                                               Balinda Quails, M.D.    PGH/MEDQ  D:  03/17/2003  T:  03/18/2003  Job:  [id number]   cc:   Theressa Millard, M.D.  [address] [place]  [place] [address]  Fax: [phone]

## 2010-07-29 ENCOUNTER — Other Ambulatory Visit: Payer: Self-pay | Admitting: Gastroenterology

## 2010-07-31 ENCOUNTER — Ambulatory Visit
Admission: RE | Admit: 2010-07-31 | Discharge: 2010-07-31 | Disposition: A | Discharge status: Home / Self Care | Payer: Medicare Other | Source: Ambulatory Visit | Attending: Gastroenterology | Admitting: Gastroenterology

## 2010-07-31 MED ORDER — IOHEXOL 300 MG/ML  SOLN
100.0000 mL | Freq: Once | INTRAMUSCULAR | Status: AC | PRN
  Administered 2010-07-31: 100 mL via INTRAVENOUS

## 2010-08-23 ENCOUNTER — Other Ambulatory Visit: Payer: Self-pay | Admitting: Obstetrics and Gynecology

## 2010-08-23 DIAGNOSIS — Z9012 Acquired absence of left breast and nipple: Secondary | ICD-10-CM

## 2010-09-19 ENCOUNTER — Ambulatory Visit: Payer: Medicare Other

## 2010-10-14 NOTE — Progress Notes (Signed)
WRITTEN REQUEST FROM THEM FOR PT TO HAVE NON-SILICONE BREAST PROTHESIS FOR HOT WEATHER. SIGNED BY DR. Eda Paschal & I FAXED BACK TO THEM TO [phone] ON 10/11/10.

## 2010-10-22 ENCOUNTER — Ambulatory Visit
Admission: RE | Admit: 2010-10-22 | Discharge: 2010-10-22 | Disposition: A | Discharge status: Home / Self Care | Payer: Medicare Other | Source: Ambulatory Visit | Attending: Obstetrics and Gynecology | Admitting: Obstetrics and Gynecology

## 2010-10-22 DIAGNOSIS — Z9012 Acquired absence of left breast and nipple: Secondary | ICD-10-CM

## 2010-10-23 ENCOUNTER — Encounter: Payer: Self-pay | Admitting: Gynecology

## 2010-10-23 DIAGNOSIS — K219 Gastro-esophageal reflux disease without esophagitis: Secondary | ICD-10-CM | POA: Insufficient documentation

## 2010-10-23 DIAGNOSIS — C801 Malignant (primary) neoplasm, unspecified: Secondary | ICD-10-CM | POA: Insufficient documentation

## 2010-11-05 ENCOUNTER — Encounter: Payer: Self-pay | Admitting: Obstetrics and Gynecology

## 2010-11-05 ENCOUNTER — Other Ambulatory Visit (HOSPITAL_COMMUNITY)
Admission: RE | Admit: 2010-11-05 | Discharge: 2010-11-05 | Disposition: A | Discharge status: Home / Self Care | Payer: Medicare Other | Source: Ambulatory Visit | Attending: Obstetrics and Gynecology | Admitting: Obstetrics and Gynecology

## 2010-11-05 ENCOUNTER — Ambulatory Visit (INDEPENDENT_AMBULATORY_CARE_PROVIDER_SITE_OTHER): Payer: Medicare Other | Admitting: Obstetrics and Gynecology

## 2010-11-05 VITALS — BP 122/70 | Ht 61.0 in | Wt 128.0 lb

## 2010-11-05 DIAGNOSIS — M810 Age-related osteoporosis without current pathological fracture: Secondary | ICD-10-CM

## 2010-11-05 DIAGNOSIS — Z124 Encounter for screening for malignant neoplasm of cervix: Secondary | ICD-10-CM | POA: Insufficient documentation

## 2010-11-05 DIAGNOSIS — N952 Postmenopausal atrophic vaginitis: Secondary | ICD-10-CM

## 2010-11-05 DIAGNOSIS — C50919 Malignant neoplasm of unspecified site of unspecified female breast: Secondary | ICD-10-CM

## 2010-11-05 DIAGNOSIS — N318 Other neuromuscular dysfunction of bladder: Secondary | ICD-10-CM

## 2010-11-05 DIAGNOSIS — N3281 Overactive bladder: Secondary | ICD-10-CM

## 2010-11-05 NOTE — Patient Instructions (Signed)
Call in October to set up next IV reclast.

## 2010-11-05 NOTE — Progress Notes (Signed)
Patient came to see me today for followup. She is now had IV reclast for 2 years. Her bone density has improved. She has had no fracture since she's been on it. She is due for her next reclast in November of this year. She is up-to-date on her mammograms. She has had breast cancer but has no existing disease. She does have atrophic vaginitis but is currently not being treated for this because she is asymptomatic. She has had no pelvic pain or vaginal bleeding. She has had urinary incontinence associated with some urgency and responded well to Toviaz. We switched her to enablex for cost and is worked just as well. She is having no current dysuria frequency or urgency. She has no hematuria. She now sees a neurologist in [place]. She is tentatively been diagnosed with Parkinson's. He is waiting some of her other studies performed he makes her final diagnosis.  Past medical history, family history, and social history recorded in epic record. They were reviewed today.  Review of systems: 9 system review of systems done and all pertinent positives recorded above.  Physical examination: HEENT within normal limits. Neck: Thyroid not large. No masses. Supraclavicular nodes: not enlarged. Breasts: Examined in both sitting midline position. No skin changes and no masses in right breast. Left breast status post mastectomy. No masses. Abdomen: Soft no guarding rebound or masses or hernia. Pelvic: External: Within normal limits. BUS: Within normal limits. Vaginal:within normal limits. Good estrogen effect. No evidence of cystocele rectocele or enterocele. Cervix: clean. Uterus: Normal size and shape. Adnexa: No masses. Rectovaginal exam: Confirmatory and negative. Extremities: Within normal limits.  Assessment: 1. Breast cancer 2. Osteoporosis 3. Overactive bladder 4. Atrophic vaginitis  Plan: Continue yearly mammograms. Continue IV reclast. patient will call in October to set up. Continue enablex 7.5 mg  daily prescription written.

## 2010-11-18 ENCOUNTER — Other Ambulatory Visit: Payer: Self-pay | Admitting: *Deleted

## 2010-11-18 MED ORDER — VITAMIN D (ERGOCALCIFEROL) 1.25 MG (50000 UNIT) PO CAPS: 50000.0000 [IU] | ORAL_CAPSULE | Freq: Once | ORAL | Status: DC

## 2010-11-29 LAB — POCT HEMOGLOBIN-HEMACUE: Hemoglobin: 13

## 2010-12-02 LAB — POCT HEMOGLOBIN-HEMACUE: Hemoglobin: 12.5

## 2010-12-10 LAB — POCT I-STAT 4, (NA,K, GLUC, HGB,HCT)
Glucose, Bld: 90
Potassium: 5.1
Sodium: 137

## 2010-12-27 ENCOUNTER — Other Ambulatory Visit: Payer: Self-pay | Admitting: *Deleted

## 2010-12-27 DIAGNOSIS — R32 Unspecified urinary incontinence: Secondary | ICD-10-CM

## 2010-12-27 DIAGNOSIS — S2239XA Fracture of one rib, unspecified side, initial encounter for closed fracture: Secondary | ICD-10-CM

## 2010-12-27 HISTORY — DX: Unspecified urinary incontinence: R32

## 2010-12-27 HISTORY — DX: Fracture of one rib, unspecified side, initial encounter for closed fracture: S22.39XA

## 2010-12-27 NOTE — Telephone Encounter (Signed)
Patient wants to change back to Toviaz.  Said that Enablex was not working well for her.  Ok to change?

## 2010-12-28 NOTE — Telephone Encounter (Signed)
In reviewing her record it appears that she is on enablex not toviaz. Has she changed?

## 2010-12-30 MED ORDER — FESOTERODINE FUMARATE ER 4 MG PO TB24: 4.0000 mg | ORAL_TABLET | Freq: Every day | ORAL | Status: DC

## 2010-12-30 NOTE — Telephone Encounter (Signed)
Yes, see note below... You had put her on Toviaz but then it was too expensive, so you put her on Enablex, but that is not working well for her.  She doesn't care the cost right now just needs to change back to Toviaz.

## 2010-12-31 NOTE — Telephone Encounter (Signed)
Per DG ok for change back.  RX sent.

## 2011-01-21 ENCOUNTER — Telehealth: Payer: Self-pay | Admitting: *Deleted

## 2011-01-21 NOTE — Telephone Encounter (Signed)
Called patient to say she is due for Reclast.  Will process benefits and call her back to give benefits and set up lab work.

## 2011-01-24 ENCOUNTER — Telehealth: Payer: Self-pay | Admitting: *Deleted

## 2011-01-24 DIAGNOSIS — M810 Age-related osteoporosis without current pathological fracture: Secondary | ICD-10-CM

## 2011-01-24 NOTE — Telephone Encounter (Signed)
Patient informed benefits to be covered for Reclast at 100% .  Patient will come for labs to be drawn then we will set up infusion after lab results have come back.

## 2011-02-03 ENCOUNTER — Ambulatory Visit: Payer: Medicare Other | Admitting: Obstetrics and Gynecology

## 2011-02-03 ENCOUNTER — Ambulatory Visit (INDEPENDENT_AMBULATORY_CARE_PROVIDER_SITE_OTHER): Payer: Medicare Other | Admitting: Gynecology

## 2011-02-03 DIAGNOSIS — N390 Urinary tract infection, site not specified: Secondary | ICD-10-CM

## 2011-02-03 DIAGNOSIS — M810 Age-related osteoporosis without current pathological fracture: Secondary | ICD-10-CM

## 2011-02-03 DIAGNOSIS — R82998 Other abnormal findings in urine: Secondary | ICD-10-CM

## 2011-02-03 LAB — CREATININE, SERUM: Creat: 1.1 mg/dL (ref 0.50–1.10)

## 2011-02-04 MED ORDER — NITROFURANTOIN MONOHYD MACRO 100 MG PO CAPS: 100.0000 mg | ORAL_CAPSULE | Freq: Two times a day (BID) | ORAL | Status: AC

## 2011-02-05 DIAGNOSIS — G609 Hereditary and idiopathic neuropathy, unspecified: Secondary | ICD-10-CM

## 2011-02-05 HISTORY — DX: Hereditary and idiopathic neuropathy, unspecified: G60.9

## 2011-02-06 ENCOUNTER — Other Ambulatory Visit: Payer: Self-pay | Admitting: *Deleted

## 2011-02-07 ENCOUNTER — Telehealth: Payer: Self-pay | Admitting: *Deleted

## 2011-02-07 ENCOUNTER — Other Ambulatory Visit (HOSPITAL_COMMUNITY): Payer: Self-pay | Admitting: *Deleted

## 2011-02-07 NOTE — Telephone Encounter (Signed)
Patient informed labs normal to set Reclast.  Set up appointment on 02/12/11 @1pm .  Instructions sheet mailed to patient.

## 2011-02-10 DIAGNOSIS — R079 Chest pain, unspecified: Secondary | ICD-10-CM

## 2011-02-10 HISTORY — DX: Chest pain, unspecified: R07.9

## 2011-02-11 ENCOUNTER — Inpatient Hospital Stay (HOSPITAL_COMMUNITY): Admission: RE | Admit: 2011-02-11 | Payer: Medicare Other | Source: Ambulatory Visit

## 2011-02-12 ENCOUNTER — Encounter (HOSPITAL_COMMUNITY): Payer: Medicare Other

## 2011-02-13 ENCOUNTER — Encounter (HOSPITAL_COMMUNITY): Payer: Medicare Other

## 2011-02-18 ENCOUNTER — Telehealth: Payer: Self-pay | Admitting: *Deleted

## 2011-02-18 NOTE — Telephone Encounter (Signed)
Patient just called to say that her appt for Reclast was rescheduled for 02/27/11 @1pm .

## 2011-02-19 ENCOUNTER — Other Ambulatory Visit: Payer: Self-pay | Admitting: Internal Medicine

## 2011-02-19 ENCOUNTER — Ambulatory Visit
Admission: RE | Admit: 2011-02-19 | Discharge: 2011-02-19 | Disposition: A | Discharge status: Home / Self Care | Payer: Medicare Other | Source: Ambulatory Visit | Attending: Internal Medicine | Admitting: Internal Medicine

## 2011-02-19 ENCOUNTER — Other Ambulatory Visit: Payer: Self-pay | Admitting: Geriatric Medicine

## 2011-02-19 DIAGNOSIS — R0781 Pleurodynia: Secondary | ICD-10-CM

## 2011-02-21 ENCOUNTER — Other Ambulatory Visit: Payer: Self-pay | Admitting: Gastroenterology

## 2011-02-21 HISTORY — PX: COLONOSCOPY W/ BIOPSIES: SHX1374

## 2011-02-25 DIAGNOSIS — K2210 Ulcer of esophagus without bleeding: Secondary | ICD-10-CM

## 2011-02-25 HISTORY — DX: Ulcer of esophagus without bleeding: K22.10

## 2011-02-27 ENCOUNTER — Ambulatory Visit (HOSPITAL_COMMUNITY)
Admission: RE | Admit: 2011-02-27 | Discharge: 2011-02-27 | Disposition: A | Discharge status: Home / Self Care | Payer: Medicare Other | Source: Ambulatory Visit | Attending: Obstetrics and Gynecology | Admitting: Obstetrics and Gynecology

## 2011-02-27 DIAGNOSIS — M810 Age-related osteoporosis without current pathological fracture: Secondary | ICD-10-CM | POA: Insufficient documentation

## 2011-02-27 MED ORDER — ZOLEDRONIC ACID 5 MG/100ML IV SOLN
5.0000 mg | Freq: Once | INTRAVENOUS | Status: AC
  Administered 2011-02-27: 5 mg via INTRAVENOUS
  Filled 2011-02-27: qty 100

## 2011-04-14 DIAGNOSIS — K449 Diaphragmatic hernia without obstruction or gangrene: Secondary | ICD-10-CM

## 2011-04-14 DIAGNOSIS — K209 Esophagitis, unspecified without bleeding: Secondary | ICD-10-CM

## 2011-04-14 DIAGNOSIS — R634 Abnormal weight loss: Secondary | ICD-10-CM

## 2011-04-14 DIAGNOSIS — S2249XA Multiple fractures of ribs, unspecified side, initial encounter for closed fracture: Secondary | ICD-10-CM

## 2011-04-14 HISTORY — DX: Abnormal weight loss: R63.4

## 2011-04-14 HISTORY — DX: Esophagitis, unspecified without bleeding: K20.90

## 2011-04-14 HISTORY — DX: Diaphragmatic hernia without obstruction or gangrene: K44.9

## 2011-04-14 HISTORY — DX: Multiple fractures of ribs, unspecified side, initial encounter for closed fracture: S22.49XA

## 2011-05-21 ENCOUNTER — Telehealth: Payer: Self-pay | Admitting: *Deleted

## 2011-05-21 NOTE — Telephone Encounter (Signed)
Lm for patient to call.  Has question about Toviaz.

## 2011-05-22 NOTE — Telephone Encounter (Signed)
Questions answered.

## 2011-05-26 DIAGNOSIS — R209 Unspecified disturbances of skin sensation: Secondary | ICD-10-CM

## 2011-05-26 DIAGNOSIS — S90129A Contusion of unspecified lesser toe(s) without damage to nail, initial encounter: Secondary | ICD-10-CM

## 2011-05-26 HISTORY — DX: Contusion of unspecified lesser toe(s) without damage to nail, initial encounter: S90.129A

## 2011-05-26 HISTORY — DX: Unspecified disturbances of skin sensation: R20.9

## 2011-06-09 DIAGNOSIS — D62 Acute posthemorrhagic anemia: Secondary | ICD-10-CM

## 2011-06-09 HISTORY — DX: Acute posthemorrhagic anemia: D62

## 2011-06-23 DIAGNOSIS — R42 Dizziness and giddiness: Secondary | ICD-10-CM

## 2011-06-23 DIAGNOSIS — Z9181 History of falling: Secondary | ICD-10-CM

## 2011-06-23 HISTORY — DX: History of falling: Z91.81

## 2011-06-23 HISTORY — DX: Dizziness and giddiness: R42

## 2011-07-03 ENCOUNTER — Encounter (HOSPITAL_COMMUNITY): Payer: Self-pay | Admitting: Anesthesiology

## 2011-07-03 ENCOUNTER — Emergency Department (HOSPITAL_COMMUNITY): Payer: Medicare Other

## 2011-07-03 ENCOUNTER — Encounter (HOSPITAL_COMMUNITY): Payer: Self-pay | Admitting: Emergency Medicine

## 2011-07-03 ENCOUNTER — Encounter (HOSPITAL_COMMUNITY)
Admission: EM | Disposition: A | Discharge status: Skilled Nursing Facility | Payer: Self-pay | Source: Home / Self Care | Attending: Orthopedic Surgery

## 2011-07-03 ENCOUNTER — Emergency Department (HOSPITAL_COMMUNITY): Payer: Medicare Other | Admitting: Anesthesiology

## 2011-07-03 ENCOUNTER — Inpatient Hospital Stay (HOSPITAL_COMMUNITY)
Admission: EM | Admit: 2011-07-03 | Discharge: 2011-07-07 | DRG: 482 | Disposition: A | Discharge status: Skilled Nursing Facility | Payer: Medicare Other | Attending: Orthopedic Surgery | Admitting: Orthopedic Surgery

## 2011-07-03 DIAGNOSIS — W010XXA Fall on same level from slipping, tripping and stumbling without subsequent striking against object, initial encounter: Secondary | ICD-10-CM | POA: Diagnosis present

## 2011-07-03 DIAGNOSIS — S72002A Fracture of unspecified part of neck of left femur, initial encounter for closed fracture: Secondary | ICD-10-CM

## 2011-07-03 DIAGNOSIS — C50919 Malignant neoplasm of unspecified site of unspecified female breast: Secondary | ICD-10-CM

## 2011-07-03 DIAGNOSIS — S72143A Displaced intertrochanteric fracture of unspecified femur, initial encounter for closed fracture: Principal | ICD-10-CM | POA: Diagnosis present

## 2011-07-03 DIAGNOSIS — S72142A Displaced intertrochanteric fracture of left femur, initial encounter for closed fracture: Secondary | ICD-10-CM

## 2011-07-03 DIAGNOSIS — Z853 Personal history of malignant neoplasm of breast: Secondary | ICD-10-CM

## 2011-07-03 DIAGNOSIS — K219 Gastro-esophageal reflux disease without esophagitis: Secondary | ICD-10-CM | POA: Diagnosis present

## 2011-07-03 DIAGNOSIS — M810 Age-related osteoporosis without current pathological fracture: Secondary | ICD-10-CM

## 2011-07-03 DIAGNOSIS — Y92009 Unspecified place in unspecified non-institutional (private) residence as the place of occurrence of the external cause: Secondary | ICD-10-CM

## 2011-07-03 HISTORY — PX: ORIF ACETABULAR FRACTURE: SHX5029

## 2011-07-03 HISTORY — PX: FEMUR IM NAIL: SHX1597

## 2011-07-03 LAB — URINALYSIS, ROUTINE W REFLEX MICROSCOPIC
Glucose, UA: NEGATIVE mg/dL
Hgb urine dipstick: NEGATIVE
Specific Gravity, Urine: 1.022 (ref 1.005–1.030)

## 2011-07-03 LAB — DIFFERENTIAL
Basophils Relative: 0 % (ref 0–1)
Eosinophils Absolute: 0 10*3/uL (ref 0.0–0.7)
Monocytes Relative: 6 % (ref 3–12)
Neutro Abs: 9.1 10*3/uL — ABNORMAL HIGH (ref 1.7–7.7)
Neutrophils Relative %: 83 % — ABNORMAL HIGH (ref 43–77)

## 2011-07-03 LAB — BASIC METABOLIC PANEL
Chloride: 105 mEq/L (ref 96–112)
GFR calc non Af Amer: 51 mL/min — ABNORMAL LOW (ref >90)
Potassium: 3.9 mEq/L (ref 3.5–5.1)
Sodium: 139 mEq/L (ref 135–145)

## 2011-07-03 LAB — CBC
Hemoglobin: 11.1 g/dL — ABNORMAL LOW (ref 12.0–15.0)
MCH: 31.4 pg (ref 26.0–34.0)
MCHC: 33.5 g/dL (ref 30.0–36.0)
Platelets: 156 10*3/uL (ref 150–400)
RBC: 3.54 MIL/uL — ABNORMAL LOW (ref 3.87–5.11)

## 2011-07-03 SURGERY — INSERTION, INTRAMEDULLARY ROD, FEMUR
Anesthesia: General | Site: Hip | Laterality: Left | Wound class: Clean

## 2011-07-03 MED ORDER — MORPHINE SULFATE 4 MG/ML IJ SOLN
4.0000 mg | Freq: Once | INTRAMUSCULAR | Status: AC
  Administered 2011-07-03: 4 mg via INTRAVENOUS
  Filled 2011-07-03: qty 1

## 2011-07-03 MED ORDER — PHENYLEPHRINE HCL 10 MG/ML IJ SOLN
INTRAMUSCULAR | Status: DC | PRN
  Administered 2011-07-03: 40 ug via INTRAVENOUS
  Administered 2011-07-03: 80 ug via INTRAVENOUS
  Administered 2011-07-03: 40 ug via INTRAVENOUS

## 2011-07-03 MED ORDER — DOCUSATE SODIUM 100 MG PO CAPS
100.0000 mg | ORAL_CAPSULE | Freq: Two times a day (BID) | ORAL | Status: DC
  Administered 2011-07-04 – 2011-07-07 (×6): 100 mg via ORAL
  Filled 2011-07-03 (×9): qty 1

## 2011-07-03 MED ORDER — CEFAZOLIN SODIUM-DEXTROSE 2-3 GM-% IV SOLR
INTRAVENOUS | Status: AC
  Filled 2011-07-03: qty 50

## 2011-07-03 MED ORDER — 0.9 % SODIUM CHLORIDE (POUR BTL) OPTIME
TOPICAL | Status: DC | PRN
  Administered 2011-07-03: 1000 mL

## 2011-07-03 MED ORDER — KETAMINE HCL 10 MG/ML IJ SOLN
INTRAMUSCULAR | Status: DC | PRN
  Administered 2011-07-03: 2 mg via INTRAVENOUS
  Administered 2011-07-03: 1 mg via INTRAVENOUS
  Administered 2011-07-03: 2 mg via INTRAVENOUS

## 2011-07-03 MED ORDER — OXYCODONE HCL 5 MG PO TABS: 10.0000 mg | ORAL_TABLET | ORAL | Status: DC | PRN

## 2011-07-03 MED ORDER — ONDANSETRON HCL 4 MG/2ML IJ SOLN
INTRAMUSCULAR | Status: DC | PRN
  Administered 2011-07-03 (×2): 2 mg via INTRAVENOUS

## 2011-07-03 MED ORDER — FENTANYL CITRATE 0.05 MG/ML IJ SOLN
INTRAMUSCULAR | Status: DC | PRN
  Administered 2011-07-03 (×4): 25 ug via INTRAVENOUS

## 2011-07-03 MED ORDER — ONDANSETRON HCL 4 MG/2ML IJ SOLN
INTRAMUSCULAR | Status: AC
  Filled 2011-07-03: qty 2

## 2011-07-03 MED ORDER — FENTANYL CITRATE 0.05 MG/ML IJ SOLN
50.0000 ug | Freq: Once | INTRAMUSCULAR | Status: AC
  Administered 2011-07-03: 50 ug via INTRAVENOUS
  Filled 2011-07-03: qty 2

## 2011-07-03 MED ORDER — ENOXAPARIN SODIUM 30 MG/0.3ML ~~LOC~~ SOLN
30.0000 mg | Freq: Two times a day (BID) | SUBCUTANEOUS | Status: DC
  Administered 2011-07-04 – 2011-07-07 (×7): 30 mg via SUBCUTANEOUS
  Filled 2011-07-03 (×8): qty 0.3

## 2011-07-03 MED ORDER — HYDROMORPHONE HCL PF 1 MG/ML IJ SOLN: 0.2500 mg | INTRAMUSCULAR | Status: DC | PRN

## 2011-07-03 MED ORDER — BUPIVACAINE HCL 0.25 % IJ SOLN
INTRAMUSCULAR | Status: DC | PRN
  Administered 2011-07-03: 10 mL

## 2011-07-03 MED ORDER — ACETAMINOPHEN 10 MG/ML IV SOLN
1000.0000 mg | Freq: Once | INTRAVENOUS | Status: DC
  Filled 2011-07-03: qty 100

## 2011-07-03 MED ORDER — LACTATED RINGERS IV SOLN: INTRAVENOUS | Status: DC

## 2011-07-03 MED ORDER — FLEET ENEMA 7-19 GM/118ML RE ENEM: 1.0000 | ENEMA | Freq: Once | RECTAL | Status: AC | PRN

## 2011-07-03 MED ORDER — ONDANSETRON HCL 4 MG/2ML IJ SOLN: 4.0000 mg | Freq: Four times a day (QID) | INTRAMUSCULAR | Status: DC | PRN

## 2011-07-03 MED ORDER — ACETAMINOPHEN 10 MG/ML IV SOLN
INTRAVENOUS | Status: AC
  Filled 2011-07-03: qty 100

## 2011-07-03 MED ORDER — SUCCINYLCHOLINE CHLORIDE 20 MG/ML IJ SOLN
INTRAMUSCULAR | Status: DC | PRN
  Administered 2011-07-03: 100 mg via INTRAVENOUS

## 2011-07-03 MED ORDER — PROMETHAZINE HCL 25 MG/ML IJ SOLN: 6.2500 mg | INTRAMUSCULAR | Status: DC | PRN

## 2011-07-03 MED ORDER — METHOCARBAMOL 100 MG/ML IJ SOLN
500.0000 mg | Freq: Four times a day (QID) | INTRAVENOUS | Status: DC | PRN
  Filled 2011-07-03 (×2): qty 5

## 2011-07-03 MED ORDER — MENTHOL 3 MG MT LOZG
1.0000 | LOZENGE | OROMUCOSAL | Status: DC | PRN
  Filled 2011-07-03: qty 9

## 2011-07-03 MED ORDER — PHENOL 1.4 % MT LIQD
1.0000 | OROMUCOSAL | Status: DC | PRN
  Filled 2011-07-03: qty 177

## 2011-07-03 MED ORDER — MORPHINE SULFATE 2 MG/ML IJ SOLN
0.5000 mg | INTRAMUSCULAR | Status: DC | PRN
Start: 2011-07-03 — End: 2011-07-04
  Administered 2011-07-04 (×2): 0.5 mg via INTRAVENOUS
  Filled 2011-07-03 (×2): qty 1

## 2011-07-03 MED ORDER — ONDANSETRON HCL 4 MG/2ML IJ SOLN
4.0000 mg | Freq: Once | INTRAMUSCULAR | Status: AC
  Administered 2011-07-03: 4 mg via INTRAVENOUS

## 2011-07-03 MED ORDER — LACTATED RINGERS IV SOLN
INTRAVENOUS | Status: DC | PRN
  Administered 2011-07-03 (×2): via INTRAVENOUS

## 2011-07-03 MED ORDER — METHOCARBAMOL 500 MG PO TABS
500.0000 mg | ORAL_TABLET | Freq: Four times a day (QID) | ORAL | Status: DC | PRN
  Administered 2011-07-04 – 2011-07-06 (×6): 500 mg via ORAL
  Filled 2011-07-03 (×7): qty 1

## 2011-07-03 MED ORDER — BUPIVACAINE HCL (PF) 0.25 % IJ SOLN
INTRAMUSCULAR | Status: AC
  Filled 2011-07-03: qty 30

## 2011-07-03 MED ORDER — CEFAZOLIN SODIUM 1-5 GM-% IV SOLN
1.0000 g | Freq: Four times a day (QID) | INTRAVENOUS | Status: AC
  Administered 2011-07-04 (×3): 1 g via INTRAVENOUS
  Filled 2011-07-03 (×3): qty 50

## 2011-07-03 MED ORDER — LIDOCAINE HCL (CARDIAC) 20 MG/ML IV SOLN
INTRAVENOUS | Status: DC | PRN
  Administered 2011-07-03: 20 mg via INTRAVENOUS

## 2011-07-03 MED ORDER — MEPERIDINE HCL 50 MG/ML IJ SOLN: 6.2500 mg | INTRAMUSCULAR | Status: DC | PRN

## 2011-07-03 MED ORDER — ACETAMINOPHEN 10 MG/ML IV SOLN
INTRAVENOUS | Status: DC | PRN
  Administered 2011-07-03: 1000 mg via INTRAVENOUS

## 2011-07-03 MED ORDER — PROPOFOL 10 MG/ML IV EMUL
INTRAVENOUS | Status: DC | PRN
  Administered 2011-07-03: 100 mg via INTRAVENOUS

## 2011-07-03 MED ORDER — ONDANSETRON HCL 4 MG PO TABS: 4.0000 mg | ORAL_TABLET | Freq: Four times a day (QID) | ORAL | Status: DC | PRN

## 2011-07-03 MED ORDER — CEFAZOLIN SODIUM-DEXTROSE 2-3 GM-% IV SOLR
2.0000 g | Freq: Once | INTRAVENOUS | Status: AC
  Administered 2011-07-03: 2 g via INTRAVENOUS
  Filled 2011-07-03: qty 50

## 2011-07-03 MED ORDER — LACTATED RINGERS IV SOLN
INTRAVENOUS | Status: DC
  Administered 2011-07-04 (×2): via INTRAVENOUS

## 2011-07-03 SURGICAL SUPPLY — 44 items
BAG SPEC THK2 15X12 ZIP CLS (MISCELLANEOUS)
BAG ZIPLOCK 12X15 (MISCELLANEOUS) ×1 IMPLANT
BLADE HELICAL 105 (Orthopedic Implant) ×1 IMPLANT
BNDG COHESIVE 4X5 TAN STRL (GAUZE/BANDAGES/DRESSINGS) ×2 IMPLANT
CLOTH BEACON ORANGE TIMEOUT ST (SAFETY) ×2 IMPLANT
COVER MAYO STAND STRL (DRAPES) ×2 IMPLANT
COVER SURGICAL LIGHT HANDLE (MISCELLANEOUS) ×2 IMPLANT
DRAPE BACK TABLE (DRAPES) ×1 IMPLANT
DRAPE C-ARM 42X72 X-RAY (DRAPES) ×1 IMPLANT
DRAPE INCISE IOBAN 66X45 STRL (DRAPES) ×1 IMPLANT
DRAPE STERI IOBAN 125X83 (DRAPES) ×2 IMPLANT
DRSG ADAPTIC 3X8 NADH LF (GAUZE/BANDAGES/DRESSINGS) ×1 IMPLANT
DRSG MEPILEX BORDER 4X4 (GAUZE/BANDAGES/DRESSINGS) ×3 IMPLANT
DRSG PAD ABDOMINAL 8X10 ST (GAUZE/BANDAGES/DRESSINGS) ×8 IMPLANT
DURAPREP 26ML APPLICATOR (WOUND CARE) ×2 IMPLANT
ELECT REM PT RETURN 9FT ADLT (ELECTROSURGICAL) ×2
ELECTRODE REM PT RTRN 9FT ADLT (ELECTROSURGICAL) ×1 IMPLANT
GLOVE BIOGEL PI IND STRL 6.5 (GLOVE) IMPLANT
GLOVE BIOGEL PI INDICATOR 6.5 (GLOVE) ×1
GLOVE ECLIPSE 7.0 STRL STRAW (GLOVE) ×1 IMPLANT
GLOVE ECLIPSE 8.5 STRL (GLOVE) ×2 IMPLANT
GLOVE INDICATOR 6.5 STRL GRN (GLOVE) ×1 IMPLANT
GLOVE SURG ORTHO 8.5 STRL (GLOVE) ×2 IMPLANT
GLOVE SURG SS PI 6.0 STRL IVOR (GLOVE) ×1 IMPLANT
GLOVE SURG SS PI 6.5 STRL IVOR (GLOVE) ×1 IMPLANT
GOWN STRL NON-REIN LRG LVL3 (GOWN DISPOSABLE) ×1 IMPLANT
GOWN STRL REIN XL XLG (GOWN DISPOSABLE) ×2 IMPLANT
GUIDEWIRE 3.2X400 (WIRE) ×1 IMPLANT
KIT BASIN OR (CUSTOM PROCEDURE TRAY) ×2 IMPLANT
MANIFOLD NEPTUNE II (INSTRUMENTS) ×1 IMPLANT
NAIL TROCH 11X130 380MM (Nail) ×1 IMPLANT
NS IRRIG 1000ML POUR BTL (IV SOLUTION) ×1 IMPLANT
PACK GENERAL/GYN (CUSTOM PROCEDURE TRAY) ×2 IMPLANT
PAD CAST 4YDX4 CTTN HI CHSV (CAST SUPPLIES) ×1 IMPLANT
PADDING CAST COTTON 4X4 STRL (CAST SUPPLIES) ×2
PADDING CAST SYN 6 (CAST SUPPLIES) ×1
PADDING CAST SYNTHETIC 6X4 NS (CAST SUPPLIES) ×1 IMPLANT
POSITIONER SURGICAL ARM (MISCELLANEOUS) ×4 IMPLANT
REAMER ROD DEEP FLUTE 2.5X950 (INSTRUMENTS) ×1 IMPLANT
SPONGE GAUZE 4X4 12PLY (GAUZE/BANDAGES/DRESSINGS) ×1 IMPLANT
STAPLER VISISTAT 35W (STAPLE) ×2 IMPLANT
SUT VIC AB 1 CT1 36 (SUTURE) ×2 IMPLANT
SUT VIC AB 2-0 CT1 18 (SUTURE) ×3 IMPLANT
TOWEL OR 17X26 10 PK STRL BLUE (TOWEL DISPOSABLE) ×5 IMPLANT

## 2011-07-03 NOTE — Consult Note (Signed)
Requesting physician: Dr. Venita Lick  Primary Care Physician: Ginette Otto, MD, MD  Reason for consultation: Pre-op clearance    History of Present Illness: Very pleasant 78 y/o woman with a h/o osteoporosis and remote breast cancer (not undergoing treatment currently), who presents to the hospital after a mechanical fall during which she suffered a left hip fracture. She never lost consciousness, denies CP,abdominal pain and SOB. Ortho has been called to admit the patient and has asked [place] to evaluate her for pre-op clearance.  Allergies:   Allergies  Allergen Reactions  . Adhesive (Tape)       Past Medical History  Diagnosis Date  . Osteoporosis   . Cancer     Breast Cancer  . Acid reflux     Past Surgical History  Procedure Date  . Appendectomy   . Tonsillectomy   . Breast surgery     Left mastectomy  . Anal fissuer   . Anal fissure repair   . Varicose vein surgery     Ligation of vein  . Tongue surgery   . Hand surgery     Scheduled Meds:    . fentaNYL  50 mcg Intravenous Once  .  morphine injection  4 mg Intravenous Once  . ondansetron      . ondansetron      . ondansetron  4 mg Intravenous Once   Continuous Infusions:  PRN Meds:.  Social History:  reports that she has never smoked. She has never used smokeless tobacco. She reports that she does not drink alcohol or use illicit drugs.  Family History  Problem Relation Age of Onset  . Heart disease Mother   . Colon cancer Sister   . Heart disease Brother   . Hypertension Brother   . Hypertension Son   . Cancer Father     Liver cancer  . Diabetes Paternal Aunt     Review of Systems:  Negative except as mentioned in HPI.  Physical Exam: Blood pressure 128/73, pulse 83, temperature 97.6 F (36.4 C), temperature source Oral, resp. rate 16, SpO2 98.00%. General: alert, awake, oriented x 3.  HEENT: [place], AT, PERLA Neck: supple, no JVD, no LAD, no bruits, no goiter. CV: RRR, no  murmurs, rubs or gallops. Lungs: CTA B Abdomen: soft, nontender, nondistended, positive bowel sounds, no masses or organomegaly noted. Ext: no C/C/E, positive pedal pulses, she has pain to palpation of her left hip with decreased range of motion. Neuro: grossly intact and nonfocal.  Labs on Admission:  Results for orders placed during the hospital encounter of 07/03/11 (from the past 48 hour(s))  CBC     Status: Abnormal   Collection Time   07/03/11 11:50 AM      Component Value Range Comment   WBC 11.0 (*) 4.0 - 10.5 (K/uL)    RBC 3.54 (*) 3.87 - 5.11 (MIL/uL)    Hemoglobin 11.1 (*) 12.0 - 15.0 (g/dL)    HCT 16.1 (*) 09.6 - 46.0 (%)    MCV 93.5  78.0 - 100.0 (fL)    MCH 31.4  26.0 - 34.0 (pg)    MCHC 33.5  30.0 - 36.0 (g/dL)    RDW 04.5  40.9 - 81.1 (%)    Platelets 156  150 - 400 (K/uL)   DIFFERENTIAL     Status: Abnormal   Collection Time   07/03/11 11:50 AM      Component Value Range Comment   Neutrophils Relative 83 (*) 43 - 77 (%)  Neutro Abs 9.1 (*) 1.7 - 7.7 (K/uL)    Lymphocytes Relative 10 (*) 12 - 46 (%)    Lymphs Abs 1.1  0.7 - 4.0 (K/uL)    Monocytes Relative 6  3 - 12 (%)    Monocytes Absolute 0.7  0.1 - 1.0 (K/uL)    Eosinophils Relative 0  0 - 5 (%)    Eosinophils Absolute 0.0  0.0 - 0.7 (K/uL)    Basophils Relative 0  0 - 1 (%)    Basophils Absolute 0.0  0.0 - 0.1 (K/uL)   BASIC METABOLIC PANEL     Status: Abnormal   Collection Time   07/03/11 11:50 AM      Component Value Range Comment   Sodium 139  135 - 145 (mEq/L)    Potassium 3.9  3.5 - 5.1 (mEq/L)    Chloride 105  96 - 112 (mEq/L)    CO2 26  19 - 32 (mEq/L)    Glucose, Bld 134 (*) 70 - 99 (mg/dL)    BUN 28 (*) 6 - 23 (mg/dL)    Creatinine, Ser 4.09  0.50 - 1.10 (mg/dL)    Calcium 9.1  8.4 - 10.5 (mg/dL)    GFR calc non Af Amer 51 (*) >90 (mL/min)    GFR calc Af Amer 60 (*) >90 (mL/min)   PROTIME-INR     Status: Normal   Collection Time   07/03/11 11:50 AM      Component Value Range Comment    Prothrombin Time 13.9  11.6 - 15.2 (seconds)    INR 1.05  0.00 - 1.49    APTT     Status: Normal   Collection Time   07/03/11 11:50 AM      Component Value Range Comment   aPTT 28  24 - 37 (seconds)   URINALYSIS, ROUTINE W REFLEX MICROSCOPIC     Status: Normal   Collection Time   07/03/11  1:00 PM      Component Value Range Comment   Color, Urine YELLOW  YELLOW     APPearance CLEAR  CLEAR     Specific Gravity, Urine 1.022  1.005 - 1.030     pH 6.5  5.0 - 8.0     Glucose, UA NEGATIVE  NEGATIVE (mg/dL)    Hgb urine dipstick NEGATIVE  NEGATIVE     Bilirubin Urine NEGATIVE  NEGATIVE     Ketones, ur NEGATIVE  NEGATIVE (mg/dL)    Protein, ur NEGATIVE  NEGATIVE (mg/dL)    Urobilinogen, UA 0.2  0.0 - 1.0 (mg/dL)    Nitrite NEGATIVE  NEGATIVE     Leukocytes, UA NEGATIVE  NEGATIVE  MICROSCOPIC NOT DONE ON URINES WITH NEGATIVE PROTEIN, BLOOD, LEUKOCYTES, NITRITE, OR GLUCOSE <1000 mg/dL.    Radiological Exams on Admission: Dg Chest 1 View  07/03/2011  *RADIOLOGY REPORT*  Clinical Data: 78 year old female preoperative study for hip replacement.  Fall.  CHEST - 1 VIEW  Comparison: 02/19/2011 and earlier.  Findings: Stable cardiomegaly and mediastinal contours.  Stable postoperative changes to the left axilla and chest wall.  No pneumothorax, pulmonary edema, pleural effusion or acute pulmonary opacity.  Moderate to severe scoliosis.  IMPRESSION: Stable cardiomegaly.  No acute cardiopulmonary abnormality.  Original Report Authenticated By: Harley Hallmark, M.D.   Dg Hip Complete Left  07/03/2011  *RADIOLOGY REPORT*  Clinical Data: 78 year old female status post fall with severe hip pain.  LEFT HIP - COMPLETE 2+ VIEW  Comparison: 10/30/2006.  Findings: Left proximal femur  intertrochanteric fracture with varus impaction.  The fracture is mildly comminuted.  There is anterior displacement evident on the cross-table lateral view.  Left femoral head remains normally located.  Left pelvis and proximal right  femur appear intact.  IMPRESSION: Comminuted left intertrochanteric femur fracture with varus impaction and anterior displacement.  Original Report Authenticated By: Harley Hallmark, M.D.    Assessment/Plan Principal Problem:  *Fracture of left hip Active Problems:  Osteoporosis  Breast cancer   #1 Left hip fracture: will require surgical repair by ortho. She has no contraindications for surgery and is relatively low risk for peri-operative complications. At this point, we can proceed with surgery without any further testing.  Thank you for this consultation. Will sign off, but please call us back with questions.  Time Spent on Consultation: 30 minutes  HERNANDEZ ACOSTA,Armin Yerger [facility]  [phone] 07/03/2011, 5:27 PM

## 2011-07-03 NOTE — H&P (Signed)
Isabella Otto, MD, MD Chief Complaint: Left hip pain History: Patient fell at home.  Denies LOC, HA, blurry vision, or mental status changes.  Unable to ambulate or weight bear on left leg after the fall.  As a result brought to ER for further evaluation and treatment.  Xrays demonstrate an intertroch. fx of the left hip.  Ortho consult requested. Past Medical History  Diagnosis Date  . Osteoporosis   . Cancer     Breast Cancer  . Acid reflux     Allergies  Allergen Reactions  . Adhesive (Tape)     No current facility-administered medications on file prior to encounter.   Current Outpatient Prescriptions on File Prior to Encounter  Medication Sig Dispense Refill  . aspirin 81 MG tablet Take 81 mg by mouth daily.        . Calcium Carbonate-Vitamin D (CALCIUM + D PO) Take 1-2 tablets by mouth See admin instructions. Take 2 tablets in am and 1 tablet in pm      . co-enzyme Q-10 30 MG capsule Take 200 mg by mouth 3 (three) times daily.        . fish oil-omega-3 fatty acids 1000 MG capsule Take 2 g by mouth daily.        . vitamin C (ASCORBIC ACID) 500 MG tablet Take 500 mg by mouth daily.        Marland Kitchen DISCONTD: fesoterodine (TOVIAZ) 4 MG TB24 Take 1 tablet (4 mg total) by mouth daily.  30 tablet  10  . Zoledronic Acid (RECLAST IV) Inject into the vein.          Physical Exam: Filed Vitals:   07/03/11 1620  BP: 128/73  Pulse: 83  Temp:   Resp: 16    Image: Dg Chest 1 View  07/03/2011  *RADIOLOGY REPORT*  Clinical Data: 78 year old female preoperative study for hip replacement.  Fall.  CHEST - 1 VIEW  Comparison: 02/19/2011 and earlier.  Findings: Stable cardiomegaly and mediastinal contours.  Stable postoperative changes to the left axilla and chest wall.  No pneumothorax, pulmonary edema, pleural effusion or acute pulmonary opacity.  Moderate to severe scoliosis.  IMPRESSION: Stable cardiomegaly.  No acute cardiopulmonary abnormality.  Original Report Authenticated By: Harley Hallmark, M.D.   Dg Hip Complete Left  07/03/2011  *RADIOLOGY REPORT*  Clinical Data: 78 year old female status post fall with severe hip pain.  LEFT HIP - COMPLETE 2+ VIEW  Comparison: 10/30/2006.  Findings: Left proximal femur intertrochanteric fracture with varus impaction.  The fracture is mildly comminuted.  There is anterior displacement evident on the cross-table lateral view.  Left femoral head remains normally located.  Left pelvis and proximal right femur appear intact.  IMPRESSION: Comminuted left intertrochanteric femur fracture with varus impaction and anterior displacement.  Original Report Authenticated By: Harley Hallmark, M.D.   A+O X 3 NVI - no motor/sensory deficits in the LE EHL/TA/GA 5/5 throughout Compartments soft/NT 1+ DP/PT pulses Significant left groin pain no knee pain No SOB/CP abd soft/NT  A/P:  Patient s/p fall with left hip fracture.  Reviewed risks/benefits with patient and care provider.  Include: death, stroke, infection, MI, hardware failure, non-union, arthritis, need for more surgery, worse pain, nerve damage.  Spoke with son - Isabella George [phone]  Alt no. [phone] and explained plan to him Attempted to contact another son Isabella George - left message. [phone]  Patient cleared by medicine for surgery - plan on IM nail left hip fracture tonight.

## 2011-07-03 NOTE — ED Provider Notes (Signed)
History     CSN: [id number]  Arrival date & time 07/03/11  1126   First MD Initiated Contact with Patient 07/03/11 1132      No chief complaint on file.   (Consider location/radiation/quality/duration/timing/severity/associated sxs/prior treatment) HPI Comments: Fall from standing.  Mechanical fall  Patient is a 78 y.o. female presenting with hip pain. The history is provided by the patient. No language interpreter was used.  Hip Pain This is a new problem. The current episode started 1 to 2 hours ago. The problem occurs constantly. The problem has not changed since onset.Pertinent negatives include no chest pain, no abdominal pain, no headaches and no shortness of breath. The symptoms are aggravated by walking, bending and twisting. The symptoms are relieved by narcotics. She has tried nothing for the symptoms. The treatment provided no relief.    Past Medical History  Diagnosis Date  . Osteoporosis   . Cancer     Breast Cancer  . Acid reflux     Past Surgical History  Procedure Date  . Appendectomy   . Tonsillectomy   . Breast surgery     Left mastectomy  . Anal fissuer   . Anal fissure repair   . Varicose vein surgery     Ligation of vein  . Tongue surgery   . Hand surgery     Family History  Problem Relation Age of Onset  . Heart disease Mother   . Colon cancer Sister   . Heart disease Brother   . Hypertension Brother   . Hypertension Son   . Cancer Father     Liver cancer  . Diabetes Paternal Aunt     History  Substance Use Topics  . Smoking status: Never Smoker   . Smokeless tobacco: Never Used  . Alcohol Use: No    OB History    Grav Para Term Preterm Abortions TAB SAB Ect Mult Living   3 2 2  1     2       Review of Systems  Constitutional: Negative for fever, chills, activity change, appetite change and fatigue.  HENT: Negative for congestion, sore throat, rhinorrhea, neck pain and neck stiffness.   Respiratory: Negative for cough and  shortness of breath.   Cardiovascular: Negative for chest pain and palpitations.  Gastrointestinal: Negative for nausea, vomiting and abdominal pain.  Genitourinary: Negative for dysuria, urgency, frequency and flank pain.  Musculoskeletal: Positive for arthralgias and gait problem. Negative for myalgias and back pain.  Neurological: Negative for dizziness, weakness, light-headedness, numbness and headaches.  All other systems reviewed and are negative.    Allergies  Adhesive  Home Medications   Current Outpatient Rx  Name Route Sig Dispense Refill  . ASPIRIN 81 MG PO TABS Oral Take 81 mg by mouth daily.      Marland Kitchen VITAMIN-B COMPLEX PO Oral Take 1 capsule by mouth daily.    Marland Kitchen CALCIUM + D PO Oral Take 1-2 tablets by mouth See admin instructions. Take 2 tablets in am and 1 tablet in pm    . COENZYME Q10 30 MG PO CAPS Oral Take 200 mg by mouth 3 (three) times daily.      . ERGOCALCIFEROL 50000 UNITS PO CAPS Oral Take 50,000 Units by mouth every 14 (fourteen) days.    . FESOTERODINE FUMARATE ER 4 MG PO TB24 Oral Take 4 mg by mouth at bedtime.    . OMEGA-3 FATTY ACIDS 1000 MG PO CAPS Oral Take 2 g by mouth daily.      Marland Kitchen  OMEPRAZOLE 20 MG PO CPDR Oral Take 40 mg by mouth daily.     Marland Kitchen VITAMIN B-12 1000 MCG PO TABS Oral Take 1,000 mcg by mouth daily.    Marland Kitchen VITAMIN C 500 MG PO TABS Oral Take 500 mg by mouth daily.      . RECLAST IV Intravenous Inject into the vein.        BP 137/66  Pulse 84  Temp(Src) 97.6 F (36.4 C) (Oral)  Resp 18  SpO2 96%  Physical Exam  Nursing note and vitals reviewed. Constitutional: She is oriented to person, place, and time. She appears well-developed and well-nourished.  HENT:  Head: Normocephalic and atraumatic.  Mouth/Throat: Oropharynx is clear and moist.  Eyes: Conjunctivae and EOM are normal. Pupils are equal, round, and reactive to light.  Neck: Normal range of motion. Neck supple.  Cardiovascular: Normal rate, regular rhythm, normal heart sounds and  intact distal pulses.  Exam reveals no gallop and no friction rub.   No murmur heard. Pulmonary/Chest: Effort normal and breath sounds normal.  Abdominal: Soft. Bowel sounds are normal. There is no tenderness.  Musculoskeletal:       Left hip: She exhibits decreased range of motion, tenderness, bony tenderness and deformity. She exhibits no swelling.       +dp and pt pulses distally in LLE  Neurological: She is alert and oriented to person, place, and time. She has normal strength. No cranial nerve deficit or sensory deficit.  Skin: Skin is warm and dry.    ED Course  Procedures (including critical care time)   Date: 07/03/2011  Rate: 83  Rhythm: normal sinus rhythm  QRS Axis: normal  Intervals: normal  ST/T Wave abnormalities: normal  Conduction Disutrbances:none  Narrative Interpretation:   Old EKG Reviewed: unchanged  Labs Reviewed  CBC - Abnormal; Notable for the following:    WBC 11.0 (*)    RBC 3.54 (*)    Hemoglobin 11.1 (*)    HCT 33.1 (*)    All other components within normal limits  DIFFERENTIAL - Abnormal; Notable for the following:    Neutrophils Relative 83 (*)    Neutro Abs 9.1 (*)    Lymphocytes Relative 10 (*)    All other components within normal limits  BASIC METABOLIC PANEL - Abnormal; Notable for the following:    Glucose, Bld 134 (*)    BUN 28 (*)    GFR calc non Af Amer 51 (*)    GFR calc Af Amer 60 (*)    All other components within normal limits  PROTIME-INR  APTT  URINALYSIS, ROUTINE W REFLEX MICROSCOPIC   Dg Chest 1 View  07/03/2011  *RADIOLOGY REPORT*  Clinical Data: 78 year old female preoperative study for hip replacement.  Fall.  CHEST - 1 VIEW  Comparison: 02/19/2011 and earlier.  Findings: Stable cardiomegaly and mediastinal contours.  Stable postoperative changes to the left axilla and chest wall.  No pneumothorax, pulmonary edema, pleural effusion or acute pulmonary opacity.  Moderate to severe scoliosis.  IMPRESSION: Stable  cardiomegaly.  No acute cardiopulmonary abnormality.  Original Report Authenticated By: Harley Hallmark, M.D.   Dg Hip Complete Left  07/03/2011  *RADIOLOGY REPORT*  Clinical Data: 78 year old female status post fall with severe hip pain.  LEFT HIP - COMPLETE 2+ VIEW  Comparison: 10/30/2006.  Findings: Left proximal femur intertrochanteric fracture with varus impaction.  The fracture is mildly comminuted.  There is anterior displacement evident on the cross-table lateral view.  Left femoral head remains  normally located.  Left pelvis and proximal right femur appear intact.  IMPRESSION: Comminuted left intertrochanteric femur fracture with varus impaction and anterior displacement.  Original Report Authenticated By: Harley Hallmark, M.D.     1. Intertrochanteric fracture of left hip       MDM  Intertrochanteric fracture of the left hip with some anterior displacement and varus impaction. Screening EKG, chest x-ray, laboratory studies were performed. Spoke with dr Shon Baton who will admit the patient. I will consult [facility] for preop clearance. NV intact distally        Dayton Bailiff, MD 07/03/11 1345

## 2011-07-03 NOTE — ED Notes (Addendum)
Pt bought by em[facility]  S/p fall pt stated she slipped and fell denies loc now c/o lt hip pain Fentanyl 200 mcg zofran 4mg 

## 2011-07-03 NOTE — Transfer of Care (Signed)
Immediate Anesthesia Transfer of Care Note  Patient: Isabella George  Procedure(s) Performed: Procedure(s) (LRB): INTRAMEDULLARY (IM) NAIL FEMORAL (Left)  Patient Location: PACU  Anesthesia Type: General  Level of Consciousness: sedated  Airway & Oxygen Therapy: Patient Spontanous Breathing and Patient connected to face mask oxygen  Post-op Assessment: Report given to PACU RN and Post -op Vital signs reviewed and stable  Post vital signs: Reviewed and stable  Complications: No apparent anesthesia complications

## 2011-07-03 NOTE — Anesthesia Preprocedure Evaluation (Addendum)
Anesthesia Evaluation  Patient identified by MRN, date of birth, ID band Patient awake    Reviewed: Allergy & Precautions, H&P , NPO status , Patient's Chart, lab work & pertinent test results  Airway Mallampati: II TM Distance: >3 FB Neck ROM: Full    Dental No notable dental hx.    Pulmonary neg pulmonary ROS,  breath sounds clear to auscultation  Pulmonary exam normal       Cardiovascular negative cardio ROS  Rhythm:Regular Rate:Normal     Neuro/Psych negative neurological ROS  negative psych ROS   GI/Hepatic negative GI ROS, Neg liver ROS, GERD-  ,  Endo/Other  negative endocrine ROS  Renal/GU negative Renal ROS  negative genitourinary   Musculoskeletal negative musculoskeletal ROS (+)   Abdominal   Peds negative pediatric ROS (+)  Hematology negative hematology ROS (+)   Anesthesia Other Findings   Reproductive/Obstetrics negative OB ROS                          Anesthesia Physical Anesthesia Plan  ASA: II  Anesthesia Plan: General   Post-op Pain Management:    Induction: Intravenous  Airway Management Planned: Oral ETT  Additional Equipment:   Intra-op Plan:   Post-operative Plan: Extubation in OR  Informed Consent: I have reviewed the patients History and Physical, chart, labs and discussed the procedure including the risks, benefits and alternatives for the proposed anesthesia with the patient or authorized representative who has indicated his/her understanding and acceptance.   Dental advisory given  Plan Discussed with: CRNA  Anesthesia Plan Comments:         Anesthesia Quick Evaluation

## 2011-07-03 NOTE — ED Notes (Signed)
XBM:WU13<KG> Expected date:07/03/11<BR> Expected time:11:20 AM<BR> Means of arrival:Ambulance<BR> Comments:<BR> M40. 78 yo f. Fall, left hip short/rotated/deformity/pain. 12-15 mins

## 2011-07-03 NOTE — Brief Op Note (Signed)
07/03/2011  9:15 PM  PATIENT:  Isabella George  78 y.o. female  PRE-OPERATIVE DIAGNOSIS:  Fracture left hip  POST-OPERATIVE DIAGNOSIS:  fractured left hip  PROCEDURE:  Procedure(s) (LRB): INTRAMEDULLARY (IM) NAIL FEMORAL (Left)  SURGEON:  Surgeon(s) and Role:    * Venita Lick, MD - Primary  PHYSICIAN ASSISTANT:   ASSISTANTS: Norval Gable   ANESTHESIA:   general  EBL:  Total I/O In: -  Out: 575 [Urine:500; Blood:75]  BLOOD ADMINISTERED:none  DRAINS: none   LOCAL MEDICATIONS USED:  MARCAINE     SPECIMEN:  No Specimen  DISPOSITION OF SPECIMEN:  N/A  COUNTS:  YES  TOURNIQUET:  * No tourniquets in log *  DICTATION: .Other Dictation: Dictation Number [id number]  PLAN OF CARE: Admit to inpatient   PATIENT DISPOSITION:  PACU - hemodynamically stable.

## 2011-07-04 MED ORDER — OXYCODONE HCL 5 MG PO TABS
5.0000 mg | ORAL_TABLET | ORAL | Status: DC | PRN
  Administered 2011-07-04 – 2011-07-07 (×11): 5 mg via ORAL
  Filled 2011-07-04 (×12): qty 1

## 2011-07-04 MED ORDER — WARFARIN - PHARMACIST DOSING INPATIENT: Freq: Every day | Status: DC

## 2011-07-04 MED ORDER — WARFARIN VIDEO: Freq: Once | Status: DC

## 2011-07-04 MED ORDER — COUMADIN BOOK
1.0000 | Freq: Once | Status: AC
  Administered 2011-07-04: 1
  Filled 2011-07-04: qty 1

## 2011-07-04 MED ORDER — WARFARIN SODIUM 4 MG PO TABS
4.0000 mg | ORAL_TABLET | Freq: Once | ORAL | Status: AC
  Administered 2011-07-04: 4 mg via ORAL
  Filled 2011-07-04: qty 1

## 2011-07-04 MED ORDER — BLISTEX EX OINT
TOPICAL_OINTMENT | CUTANEOUS | Status: AC
  Filled 2011-07-04: qty 10

## 2011-07-04 NOTE — Anesthesia Postprocedure Evaluation (Signed)
  Anesthesia Post-op Note  Patient: Isabella George  Procedure(s) Performed: Procedure(s) (LRB): INTRAMEDULLARY (IM) NAIL FEMORAL (Left)  Patient Location: PACU  Anesthesia Type: General  Level of Consciousness: awake and alert   Airway and Oxygen Therapy: Patient Spontanous Breathing  Post-op Pain: mild  Post-op Assessment: Post-op Vital signs reviewed, Patient's Cardiovascular Status Stable, Respiratory Function Stable, Patent Airway and No signs of Nausea or vomiting  Post-op Vital Signs: stable  Complications: No apparent anesthesia complications

## 2011-07-04 NOTE — Progress Notes (Signed)
Clinical Social Work Department CLINICAL SOCIAL WORK PLACEMENT NOTE 07/04/2011  Patient:  Isabella George, Isabella George  Account Number:  [id number] Admit date:  07/03/2011  Clinical Social Worker:  Cori Razor, LCSW  Date/time:  07/04/2011 01:47 PM  Clinical Social Work is seeking post-discharge placement for this patient at the following level of care:   SKILLED NURSING   (*CSW will update this form in Epic as items are completed)     Patient/family provided with [facility] Department of Clinical Social Work's list of facilities offering this level of care within the geographic area requested by the patient (or if unable, by the patient's family).    Patient/family informed of their freedom to choose among providers that offer the needed level of care, that participate in Medicare, Medicaid or managed care program needed by the patient, have an available bed and are willing to accept the patient.    Patient/family informed of [facility]' ownership interest in [facility], as well as of the fact that they are under no obligation to receive care at this facility.  PASARR submitted to EDS on ( not required ) PASARR number received from EDS on   FL2 transmitted to all facilities in geographic area requested by pt/family on   FL2 transmitted to all facilities within larger geographic area on   Patient informed that his/her managed care company has contracts with or will negotiate with  certain facilities, including the following:     Patient/family informed of bed offers received:   Patient chooses bed at [facility]. Physician recommends and patient chooses bed at    Patient to be transferred to [facility] on   Patient to be transferred to facility by   The following physician request were entered in Epic:   Additional Comments: Pt is from [facility] and will return  to SNF unit when medically stable.  Cori Razor LCSW [phone]

## 2011-07-04 NOTE — Progress Notes (Signed)
ANTICOAGULATION CONSULT NOTE - Initial Consult  Pharmacy Consult for warfarin Indication: VTE prophylaxis  Allergies  Allergen Reactions  . Adhesive (Tape)     Patient Measurements:   Heparin Dosing Weight:   Vital Signs: Temp: 98 F (36.7 C) (04/25 2245) BP: 125/67 mmHg (04/25 2316) Pulse Rate: 91  (04/25 2316)  Labs:  Basename 07/03/11 1150  HGB 11.1*  HCT 33.1*  PLT 156  APTT 28  LABPROT 13.9  INR 1.05  HEPARINUNFRC --  CREATININE 1.03  CKTOTAL --  CKMB --  TROPONINI --   The CrCl is unknown because both a height and weight (above a minimum accepted value) are required for this calculation.  Medical History: Past Medical History  Diagnosis Date  . Osteoporosis   . Cancer     Breast Cancer  . Acid reflux      Assessment: Patient with order for warfarin per pharmacy after ortho surgery.  Goal of Therapy:  INR 2-3   Plan:  Daily PT/INR, Coumadin Book/video  Warfarin 4mg  po x1 at [address] [place] 07/04/2011,12:07 AM

## 2011-07-04 NOTE — Op Note (Signed)
NAME:  Isabella George, Isabella George NO.:  [id number]  MEDICAL RECORD NO.:  [id number]  LOCATION:  1605                         FACILITY:  [facility]  PHYSICIAN:  Alvy Beal, MD    DATE OF BIRTH:  [birth year]  DATE OF PROCEDURE:  07/03/2011 DATE OF DISCHARGE:                              OPERATIVE REPORT   PREOPERATIVE DIAGNOSIS:  Left intertrochanteric hip fracture.  POSTOPERATIVE DIAGNOSIS:  Left intertrochanteric hip fracture.  OPERATIVE PROCEDURE:  Open reduction and internal fixation with intramedullary nail fixation of the left hip.  COMPLICATIONS:  None.  FIRST ASSISTANT:  Norval Gable, PA  HISTORY:  This is a very pleasant elderly woman, who fell earlier today and noted immediate pain and deformity of her left hip.  She was unable to ambulate and was brought to the emergency room.  X-rays demonstrated a left intertrochanteric hip fracture.  After discussing the fracture with the patient, her caregiver, and her 2 sons, we elected to proceed with surgery.  All appropriate risks, benefits, and alternatives were discussed with the family and the patient.  OPERATIVE NOTE:  The patient was brought to the operating room.  After successful induction of general anesthesia and endotracheal intubation, TEDs were placed on the well leg, and she was moved onto the [place] fracture frame.  The left lower extremity which was identified in the holding area, was placed in traction.  The right leg was placed gently in the well-leg holder.  The upper extremity was secured __________.  A time-out was done to confirm the patient, procedure, and the affected extremity.  Once this was done, the left lower extremity was prepped and draped in a standard fashion.  A gentle closed reduction maneuver was performed.  Once the extremity was draped, I then made a small stab incision proximal to the greater trochanter.  The guidepin was advanced percutaneously through the tip of the greater  trochanter.  It was advanced manually through the greater trochanter and into the fossa.  I confirmed satisfactory position of the guidepin in both the AP and lateral planes.  Once this was done, I made a larger incision centered about the guidepin.  I then advanced the all-in-one step reaming drill over the guidepin.  This was drilled down to the appropriate depth.  At this point, because she had a history of falling, I elected to use a long trochanteric nail instead of a short one for fear that if she fell in the future, she would have higher likelihood for midshaft fracture. As a result, I placed the guidepin down just to the distal femur.  I then reamed.  Once I had an adequate canal, I used an 11 x 38 Synthes trochanteric nail.  This was malleted down to the appropriate depth without significant problems.  At this point, I made a second incision in order to advance the lag screw.  I drilled the guidepin for the lag screw and was properly seated in both the AP and lateral planes.  I breached the outer cortex and used a 105 mm long lag screw.  It was malleted appropriately to the correct depth.  At this point, I then locked the nail.  Then I advanced the locking nut in the proximal aspect of the nail down on the compression screw and then backed it off __________.  I then removed all of the inserting apparatus.  Final x- rays were satisfactory.  The hardware was in good position.  Because this was a low impact injury and I had a long nail in, I did not feel as though the distal locking screw was required.  All wounds were copiously irrigated.  X-rays were satisfactory and so the deep fascia was closed with interrupted #1 Vicryl sutures, then a layer of 2-0 Vicryl sutures and staples for the skin.  Marcaine was then used to anesthetize the wound, and then dry dressings were applied.  The patient was extubated and transferred to the PACU without incident.  At the end of the case, all  needle and sponge counts were correct.  No adverse intraoperative events.     Alvy Beal, MD     DDB/MEDQ  D:  07/03/2011  T:  07/04/2011  Job:  [id number]

## 2011-07-04 NOTE — H&P (Signed)
Clinical Social Work Department BRIEF PSYCHOSOCIAL ASSESSMENT 07/04/2011  Patient:  Isabella George, Isabella George     Account Number:  [id number]     Admit date:  07/03/2011  Clinical Social Worker:  Candie Chroman  Date/Time:  07/04/2011 01:36 PM  Referred by:  Physician  Date Referred:  07/04/2011 Referred for  SNF Placement   Other Referral:   Interview type:  Patient Other interview type:    PSYCHOSOCIAL DATA Living Status:  FACILITY Admitted from facility:  [facility] Level of care:  Independent Living Primary support name:  Boots Mcglown Primary support relationship to patient:  CHILD, ADULT Degree of support available:   supportive    CURRENT CONCERNS Current Concerns  Post-Acute Placement   Other Concerns:    SOCIAL WORK ASSESSMENT / PLAN Pt is a 78 yr old female admitted from [facility]. Met with pt / spoke with son Raiford Noble [phone] ). Pt plans to return to [facility] rehab unit when ready for d/c ( possibly Monday ). SNF has confirmed plan. CSW will assist with d/c planning to SNF.   Assessment/plan status:   Other assessment/ plan:   Information/referral to community resources:    PATIENT'S/FAMILY'S RESPONSE TO PLAN OF CARE: pt/family are agreeable with d/c plan.    Cori Razor  LCSW  [phone]

## 2011-07-05 MED ORDER — PANTOPRAZOLE SODIUM 40 MG PO TBEC
40.0000 mg | DELAYED_RELEASE_TABLET | Freq: Every day | ORAL | Status: DC
  Administered 2011-07-05 – 2011-07-07 (×3): 40 mg via ORAL
  Filled 2011-07-05 (×2): qty 1

## 2011-07-05 MED ORDER — PANTOPRAZOLE SODIUM 20 MG PO TBEC
20.0000 mg | DELAYED_RELEASE_TABLET | Freq: Every day | ORAL | Status: DC
  Filled 2011-07-05: qty 1

## 2011-07-05 MED ORDER — WARFARIN SODIUM 4 MG PO TABS
4.0000 mg | ORAL_TABLET | Freq: Once | ORAL | Status: AC
  Administered 2011-07-05: 4 mg via ORAL
  Filled 2011-07-05: qty 1

## 2011-07-05 NOTE — Progress Notes (Signed)
Order received, chart reviewed, noted pt is to D/C to [facility] SNF when medically stable.  Will defer OT eval to that facility, acute OT will sign off.  Ignacia Palma, [place] [phone] 07/05/2011

## 2011-07-05 NOTE — Evaluation (Signed)
Physical Therapy Evaluation Patient Details Name: Isabella George MRN: [id number] DOB: 1932-06-01 Today's Date: 07/05/2011 Time: 4098-1191 PT Time Calculation (min): 26 min  Evaluation performed 07/04/11 by Kingsley Callander, PT  PT Assessment / Plan / Recommendation Clinical Impression  Pt s/p IM nailing of L hip. Pt will need skilled nursing facility for continued rehab once d/ced from acute care.     PT Assessment  Patient needs continued PT services    Follow Up Recommendations  Skilled nursing facility    Equipment Recommendations  Defer to next venue    Frequency Min 5X/week    Precautions / Restrictions Restrictions Weight Bearing Restrictions: Yes LLE Weight Bearing: Weight bearing as tolerated         Mobility  Bed Mobility Bed Mobility: Supine to Sit Supine to Sit: 2: Max assist Details for Bed Mobility Assistance: initiated transfer by herself and then needed A to complete transfer Transfers Transfers: Sit to Stand Sit to Stand: 1: +2 Total assist Sit to Stand: Patient Percentage: 50% Details for Transfer Assistance: pt leaning backwards,  cues for hand placement and posture once standing. Ambulation/Gait Ambulation/Gait Assistance: 1: +2 Total assist Ambulation/Gait: Patient Percentage: 10 Assistive device: Rolling walker Gait Pattern: Decreased step length - right;Decreased step length - left;Decreased weight shift to left;Decreased weight shift to right    Exercises General Exercises - Lower Extremity Quad Sets: Strengthening;Left;5 reps;Supine Heel Slides: PROM;AAROM;Left;5 reps;Supine Hip ABduction/ADduction: AAROM;Left;PROM;5 reps;Supine   PT Goals Acute Rehab PT Goals PT Goal Formulation: With patient Time For Goal Achievement: 07/11/11 Potential to Achieve Goals: Good Pt will go Supine/Side to Sit: with min assist PT Goal: Supine/Side to Sit - Progress: Goal set today Pt will go Sit to Stand: with min assist PT Goal: Sit to Stand - Progress:  Goal set today Pt will Ambulate: 16 - 50 feet;with rolling walker;with min assist PT Goal: Ambulate - Progress: Goal set today Pt will Perform Home Exercise Program: with supervision, verbal cues required/provided PT Goal: Perform Home Exercise Program - Progress: Goal set today  Visit Information  Last PT Received On: 07/05/11 Assistance Needed: +2    Subjective Data  Subjective: Sitter present.  Pt alert and oriented, but very quiet. Patient Stated Goal: Pt agreeable to PT.   Prior Functioning  Home Living Lives With: Alone (Sitters 6 hours/day, I living at [facility]) Available Help at Discharge: Other (Comment);Family (son lives in town.  Sitter with pt at time of eval.) Type of Home: Apartment Home Access: Level entry Home Layout: One level Bathroom Shower/Tub: Health visitor: Handicapped height Home Adaptive Equipment: Walker - rolling;Shower chair with back Additional Comments: Ophelia Charter states that pt has sleep disorder so she spends most of her time in bed.   Prior Function Level of Independence: Independent Driving: No Vocation: Retired Musician: No difficulties    Cognition  Overall Cognitive Status: Appears within functional limits for tasks assessed/performed Arousal/Alertness: Awake/alert Orientation Level: Appears intact for tasks assessed Behavior During Session: [facility] for tasks performed    Extremity/Trunk Assessment Right Lower Extremity Assessment RLE ROM/Strength/Tone: Within functional levels Left Lower Extremity Assessment LLE ROM/Strength/Tone: Due to pain;Deficits LLE ROM/Strength/Tone Deficits: AROM limited by pain,  AAROM/PROM limited by 25-50% LLE Sensation: WFL - Light Touch   Balance    End of Session PT - End of Session Equipment Utilized During Treatment: Gait belt Activity Tolerance: Patient limited by pain;Patient limited by fatigue Patient left: in chair;with family/visitor present Nurse Communication:  Other (comment) (catheter bag possibly leaking)  Pulled into progress note by: Sarajane Jews 07/05/2011, 12:40 PM Pager: [phone]

## 2011-07-05 NOTE — Progress Notes (Signed)
Physical Therapy Treatment Patient Details Name: Isabella George MRN: [id number] DOB: 1932-11-01 Today's Date: 07/05/2011 Time: 4098-1191 PT Time Calculation (min): 26 min  PT Assessment / Plan / Recommendation Comments on Treatment Session  Continues to needed increased assistance with mobility.    Follow Up Recommendations  Skilled nursing facility    Equipment Recommendations  Defer to next venue    Frequency Min 5X/week   Plan Discharge plan remains appropriate;Frequency remains appropriate    Precautions / Restrictions Precautions Precautions: Fall Restrictions Weight Bearing Restrictions: Yes LLE Weight Bearing: Weight bearing as tolerated       Mobility  Bed Mobility Bed Mobility: Supine to Sit Supine to Sit: 2: Max assist Details for Bed Mobility Assistance: initiated transfer by herself and then needed A to complete transfer Transfers Stand Pivot Transfers: 2: Max assist (from bed to recliner) Stand Pivot Transfers: Patient Percentage: 50% Details for Transfer Assistance: pt leaning backwards,  cues for hand placement and posture once standing.    Exercises General Exercises - Lower Extremity Ankle Circles/Pumps: AROM;Both;10 reps;Supine Quad Sets: Strengthening;Left;5 reps;Supine Heel Slides: PROM;AAROM;Left;5 reps;Supine   PT Goals      Acute Rehab PT Goals Pt will go Supine/Side to Sit: with min assist PT Goal: Supine/Side to Sit - Progress: Progressing toward goal Pt will go Sit to Stand: with min assist PT Goal: Sit to Stand - Progress: Progressing toward goal Pt will Perform Home Exercise Program: with supervision, verbal cues required/provided PT Goal: Perform Home Exercise Program - Progress: Progressing toward goal  Visit Information  Last PT Received On: 07/05/11 Assistance Needed: +2    Subjective Data  Subjective: Pt sleeping with son present. Easily arousalble for mobility. Agreeable to therapy today..   Cognition  Overall Cognitive  Status: Appears within functional limits for tasks assessed/performed Arousal/Alertness: Awake/alert Orientation Level: Appears intact for tasks assessed Behavior During Session: [facility] for tasks performed    Balance  Balance Balance Assessed: Yes Static Sitting Balance Static Sitting - Balance Support: Bilateral upper extremity supported;Feet supported Static Sitting - Level of Assistance: 5: Stand by assistance;3: Mod assist Static Sitting - Comment/# of Minutes: pt with intitial posterior lean requiring mod assist to maintain neutral position. with cues and time pt progressed to min guard assist with reminder cues if she began to lean back. Edge of bed x5 minutes total.   End of Session PT - End of Session Equipment Utilized During Treatment: Gait belt Activity Tolerance: Patient limited by pain;Patient limited by fatigue Patient left: in chair;with family/visitor present Nurse Communication: To use the sara plus or steady lift for back to bed.    Sallyanne Kuster 07/05/2011, 1:09 PM  Sallyanne Kuster, PTA Office- [phone] Pager- [phone]

## 2011-07-05 NOTE — Progress Notes (Signed)
ANTICOAGULATION CONSULT NOTE - Follow Up Consult  Pharmacy Consult for Warfarin Indication: VTE prophylaxis  Allergies  Allergen Reactions  . Adhesive (Tape)     Patient Measurements: Height: 5\' 6"  (167.6 cm) Weight: 125 lb (56.7 kg) IBW/kg (Calculated) : 59.3    Vital Signs: Temp: 99 F (37.2 C) (04/27 0600) Temp src: Oral (04/27 0600) BP: 114/61 mmHg (04/27 0600) Pulse Rate: 106  (04/27 0600)  Labs:  Basename 07/05/11 0436 07/04/11 0421 07/03/11 1150  HGB -- -- 11.1*  HCT -- -- 33.1*  PLT -- -- 156  APTT -- -- 28  LABPROT 15.6* 15.4* 13.9  INR 1.21 1.19 1.05  HEPARINUNFRC -- -- --  CREATININE -- -- 1.03  CKTOTAL -- -- --  CKMB -- -- --  TROPONINI -- -- --   Estimated Creatinine Clearance: 41.6 ml/min (by C-G formula based on Cr of 1.03).   Medications:  Scheduled:    . acetaminophen  1,000 mg Intravenous Once  .  ceFAZolin (ANCEF) IV  1 g Intravenous Q6H  . coumadin book  1 each Does not apply Once  . docusate sodium  100 mg Oral BID  . enoxaparin  30 mg Subcutaneous Q12H  . lip balm      . warfarin  4 mg Oral ONCE-1800  . warfarin   Does not apply Once  . Warfarin - Pharmacist Dosing Inpatient   Does not apply q1800   Infusions:    . lactated ringers 85 mL/hr at 07/04/11 2104   PRN: menthol-cetylpyridinium, methocarbamol, ondansetron, oxyCODONE, phenol  Assessment:  78 yo F s/p IM nail of L femoral on 4/25 on coumadin for VTE prophylaxis post - op.  Also on lovenox 30 mg SQ until INR >/= 1.8.   INR slowly trending up during initiation process  No CBC today, No bleeding/complications reported   Goal of Therapy:  INR 2-3   Plan:  1.) Repeat coumadin 4 mg po x 1 tonight at 1800 2.) Continue Lovenox overlap  3.) Follow daily PT/INR  Juron Vorhees, Loma Messing PharmD 8:46 AM 07/05/2011

## 2011-07-05 NOTE — Progress Notes (Signed)
Orthopedics Progress Note  Subjective: Pt c/o moderate pain to left hip this morning. Denies any other issues  Objective:  Filed Vitals:   07/05/11 0600  BP: 114/61  Pulse: 106  Temp: 99 F (37.2 C)  Resp: 18    General: Awake and alert  Musculoskeletal: left hip incision healing well, no current drainage, nv intact distally Neurovascularly intact  Lab Results  Component Value Date   WBC 11.0* 07/03/2011   HGB 11.1* 07/03/2011   HCT 33.1* 07/03/2011   MCV 93.5 07/03/2011   PLT 156 07/03/2011       Component Value Date/Time   NA 139 07/03/2011 1150   K 3.9 07/03/2011 1150   CL 105 07/03/2011 1150   CO2 26 07/03/2011 1150   GLUCOSE 134* 07/03/2011 1150   BUN 28* 07/03/2011 1150   CREATININE 1.03 07/03/2011 1150   CREATININE 1.10 02/03/2011 1533   CALCIUM 9.1 07/03/2011 1150   GFRNONAA 51* 07/03/2011 1150   GFRAA 60* 07/03/2011 1150    Lab Results  Component Value Date   INR 1.21 07/05/2011   INR 1.19 07/04/2011   INR 1.05 07/03/2011    Assessment/Plan: POD #2 s/p Procedure(s): INTRAMEDULLARY (IM) NAIL FEMORAL  D/c planning PT/OT as able Pain control  Almedia Balls. Ranell Patrick, MD 07/05/2011 7:05 AM

## 2011-07-06 LAB — PROTIME-INR
INR: 1.15 (ref 0.00–1.49)
Prothrombin Time: 14.9 seconds (ref 11.6–15.2)

## 2011-07-06 MED ORDER — WARFARIN SODIUM 6 MG PO TABS
6.0000 mg | ORAL_TABLET | Freq: Once | ORAL | Status: AC
  Administered 2011-07-06: 6 mg via ORAL
  Filled 2011-07-06: qty 1

## 2011-07-06 NOTE — Progress Notes (Signed)
ANTICOAGULATION CONSULT NOTE - Follow Up Consult  Pharmacy Consult for Warfarin Indication: VTE prophylaxis  Allergies  Allergen Reactions  . Adhesive (Tape)     Patient Measurements: Height: 5\' 6"  (167.6 cm) Weight: 125 lb (56.7 kg) IBW/kg (Calculated) : 59.3    Vital Signs: Temp: 98.3 F (36.8 C) (04/28 0555) Temp src: Oral (04/28 0555) BP: 111/63 mmHg (04/28 0555) Pulse Rate: 91  (04/28 0555)  Labs:  Alvira Philips 07/06/11 0424 07/05/11 0436 07/04/11 0421 07/03/11 1150  HGB -- -- -- 11.1*  HCT -- -- -- 33.1*  PLT -- -- -- 156  APTT -- -- -- 28  LABPROT 14.9 15.6* 15.4* --  INR 1.15 1.21 1.19 --  HEPARINUNFRC -- -- -- --  CREATININE -- -- -- 1.03  CKTOTAL -- -- -- --  CKMB -- -- -- --  TROPONINI -- -- -- --   Estimated Creatinine Clearance: 41.6 ml/min (by C-G formula based on Cr of 1.03).   Medications:  Scheduled:     . acetaminophen  1,000 mg Intravenous Once  . docusate sodium  100 mg Oral BID  . enoxaparin  30 mg Subcutaneous Q12H  . lip balm      . pantoprazole  40 mg Oral Q1200  . warfarin  4 mg Oral ONCE-1800  . warfarin   Does not apply Once  . Warfarin - Pharmacist Dosing Inpatient   Does not apply q1800  . DISCONTD: pantoprazole  20 mg Oral Q1200   Infusions:     . lactated ringers 85 mL/hr at 07/05/11 1800   PRN: menthol-cetylpyridinium, methocarbamol, ondansetron, oxyCODONE, phenol  Assessment:  78 yo F s/p IM nail of L femoral on 4/25 on coumadin for VTE prophylaxis post - op.  Also on lovenox 30 mg SQ until INR >/= 1.8.   INR not responding to two doses of 4 mg, will increase dose today.   No CBC today, No bleeding/complications reported   Goal of Therapy:  INR 2-3   Plan:  1.) coumadin 6 mg po x 1 tonight at 1800 2.) Continue Lovenox overlap  3.) Follow daily PT/INR  Aveon Colquhoun, Loma Messing PharmD 8:25 AM 07/06/2011

## 2011-07-06 NOTE — Progress Notes (Signed)
CSW spoke with RN regarding discharge. Pt anticipating discharge tomorrow to [facility] tomorrow. Weekday CSW, Asher Muir to f/u.    Unice Bailey, LCSWA [phone]

## 2011-07-06 NOTE — Progress Notes (Signed)
Orthopedics Progress Note  Subjective: S/p left hip surgery resting comfortably after just having pain pill prior to arrival  Objective:  Filed Vitals:   07/06/11 0555  BP: 111/63  Pulse: 91  Temp: 98.3 F (36.8 C)  Resp: 17    General: Awake and alert  Musculoskeletal: left hip incisions healing well, nv intact distally Neurovascularly intact  Lab Results  Component Value Date   WBC 11.0* 07/03/2011   HGB 11.1* 07/03/2011   HCT 33.1* 07/03/2011   MCV 93.5 07/03/2011   PLT 156 07/03/2011       Component Value Date/Time   NA 139 07/03/2011 1150   K 3.9 07/03/2011 1150   CL 105 07/03/2011 1150   CO2 26 07/03/2011 1150   GLUCOSE 134* 07/03/2011 1150   BUN 28* 07/03/2011 1150   CREATININE 1.03 07/03/2011 1150   CREATININE 1.10 02/03/2011 1533   CALCIUM 9.1 07/03/2011 1150   GFRNONAA 51* 07/03/2011 1150   GFRAA 60* 07/03/2011 1150    Lab Results  Component Value Date   INR 1.15 07/06/2011   INR 1.21 07/05/2011   INR 1.19 07/04/2011    Assessment/Plan: POD #3  s/p Procedure(s):left INTRAMEDULLARY (IM) NAIL FEMORAL  D/c planning to snf Pt/OT as able Pain control  Almedia Balls. Ranell Patrick, MD 07/06/2011 8:12 AM

## 2011-07-07 DIAGNOSIS — S72143A Displaced intertrochanteric fracture of unspecified femur, initial encounter for closed fracture: Secondary | ICD-10-CM

## 2011-07-07 HISTORY — DX: Displaced intertrochanteric fracture of unspecified femur, initial encounter for closed fracture: S72.143A

## 2011-07-07 LAB — PROTIME-INR: Prothrombin Time: 15.4 seconds — ABNORMAL HIGH (ref 11.6–15.2)

## 2011-07-07 MED ORDER — WARFARIN SODIUM 6 MG PO TABS
6.0000 mg | ORAL_TABLET | Freq: Once | ORAL | Status: DC
  Filled 2011-07-07: qty 1

## 2011-07-07 MED ORDER — OXYCODONE-ACETAMINOPHEN 5-325 MG PO TABS: 1.0000 | ORAL_TABLET | Freq: Three times a day (TID) | ORAL | Status: AC | PRN

## 2011-07-07 MED ORDER — ENOXAPARIN SODIUM 30 MG/0.3ML ~~LOC~~ SOLN: 30.0000 mg | Freq: Two times a day (BID) | SUBCUTANEOUS | Status: DC

## 2011-07-07 MED ORDER — WARFARIN SODIUM 6 MG PO TABS: 6.0000 mg | ORAL_TABLET | Freq: Once | ORAL | Status: DC

## 2011-07-07 NOTE — Progress Notes (Signed)
Pt planning to return to [facility] today. FL2 to shadow chart for MD signature. Scripts for narcotics are needed by SNF. CSW will assist with trans to [facility] .  Cori Razor LCSW [phone]

## 2011-07-07 NOTE — Discharge Summary (Signed)
Patient ID: Isabella George MRN: [id number] DOB/AGE: 76-Dec-1934 76 y.o.  Admit date: 07/03/2011 Discharge date: 07/07/2011  Admission Diagnoses:  Principal Problem:  *Fracture of left hip Active Problems:  Osteoporosis  Breast cancer   Discharge Diagnoses:  Principal Problem:  *Fracture of left hip Active Problems:  Osteoporosis  Breast cancer   Past Medical History  Diagnosis Date  . Osteoporosis   . Cancer     Breast Cancer  . Acid reflux     Surgeries: Procedure(s): INTRAMEDULLARY (IM) NAIL FEMORAL on 07/03/2011   Consultants: Treatment Team:  Venita Lick, MD  Discharged Condition: Improved  Hospital Course: Isabella George is an 78 y.o. female who was admitted 07/03/2011 for operative treatment ofFracture of left hip. Patient failed conservative treatments (please see the history and physical for the specifics) and had severe unremitting pain that affects sleep, daily activities, and work/hobbies. After pre-op clearance the patient was taken to the operating room on 07/03/2011 and underwent  Procedure(s): INTRAMEDULLARY (IM) NAIL FEMORAL.    Patient was given perioperative antibiotics: Anti-infectives     Start     Dose/Rate Route Frequency Ordered Stop   07/04/11 0200   ceFAZolin (ANCEF) IVPB 1 g/50 mL premix        1 g 100 mL/hr over 30 Minutes Intravenous Every 6 hours 07/03/11 2353 07/04/11 1343   07/03/11 2000   ceFAZolin (ANCEF) IVPB 2 g/50 mL premix        2 g 100 mL/hr over 30 Minutes Intravenous  Once 07/03/11 1934 07/03/11 2010           Patient was given sequential compression devices and early ambulation to prevent DVT.   Patient benefited maximally from hospital stay and there were no complications. At the time of discharge, tolerating a regular diet, pain is controlled with oral pain medications and they have been cleared by PT/OT.   Recent vital signs: Patient Vitals for the past 24 hrs:  BP Temp Temp src Pulse Resp SpO2  07/07/11 0435  125/69 mmHg 97.5 F (36.4 C) Oral 91  16  100 %  07-Jul-2011 2151 117/70 mmHg 98.5 F (36.9 C) Oral 98  16  99 %  2011-07-07 1608 119/57 mmHg 98.3 F (36.8 C) Oral 111  16  -  07-07-2011 1438 93/52 mmHg 97.3 F (36.3 C) Oral 107  16  98 %     Recent laboratory studies:  Basename 07/07/11 0405 2011-07-07 0424  WBC -- --  HGB -- --  HCT -- --  PLT -- --  NA -- --  K -- --  CL -- --  CO2 -- --  BUN -- --  CREATININE -- --  GLUCOSE -- --  INR 1.19 1.15  CALCIUM -- --     Discharge Medications:   Medication List  As of 07/07/2011  8:07 AM   STOP taking these medications         aspirin 81 MG tablet         TAKE these medications         CALCIUM + D PO   Take 1-2 tablets by mouth See admin instructions. Take 2 tablets in am and 1 tablet in pm      co-enzyme Q-10 30 MG capsule   Take 200 mg by mouth 3 (three) times daily.      enoxaparin 30 MG/0.3ML injection   Commonly known as: LOVENOX   Inject 0.3 mLs (30 mg total) into the skin every 12 (twelve)  hours. Continue lovenox injections until INR is 2.0 or greater than discontinue lovenox      ergocalciferol 50000 UNITS capsule   Commonly known as: VITAMIN D2   Take 50,000 Units by mouth every 14 (fourteen) days.      fesoterodine 4 MG Tb24   Commonly known as: TOVIAZ   Take 4 mg by mouth at bedtime.      fish oil-omega-3 fatty acids 1000 MG capsule   Take 2 g by mouth daily.      omeprazole 20 MG capsule   Commonly known as: PRILOSEC   Take 40 mg by mouth daily.      oxyCODONE-acetaminophen 5-325 MG per tablet   Commonly known as: PERCOCET   Take 1 tablet by mouth every 8 (eight) hours as needed for pain. MAX 3 pills daily      RECLAST IV   Inject into the vein.      vitamin B-12 1000 MCG tablet   Commonly known as: CYANOCOBALAMIN   Take 1,000 mcg by mouth daily.      vitamin C 500 MG tablet   Commonly known as: ASCORBIC ACID   Take 500 mg by mouth daily.      VITAMIN-B COMPLEX PO   Take 1 capsule by mouth  daily.      warfarin 6 MG tablet   Commonly known as: COUMADIN   Take 1 tablet (6 mg total) by mouth one time only at 6 PM. Coumadin protocol per pharmacy.  Titrate level based on INR target level between 2.0-3.0.            Diagnostic Studies: Dg Chest 1 View  07/03/2011  *RADIOLOGY REPORT*  Clinical Data: 78 year old female preoperative study for hip replacement.  Fall.  CHEST - 1 VIEW  Comparison: 02/19/2011 and earlier.  Findings: Stable cardiomegaly and mediastinal contours.  Stable postoperative changes to the left axilla and chest wall.  No pneumothorax, pulmonary edema, pleural effusion or acute pulmonary opacity.  Moderate to severe scoliosis.  IMPRESSION: Stable cardiomegaly.  No acute cardiopulmonary abnormality.  Original Report Authenticated By: Harley Hallmark, M.D.   Dg Hip Complete Left  07/03/2011  *RADIOLOGY REPORT*  Clinical Data: 78 year old female status post fall with severe hip pain.  LEFT HIP - COMPLETE 2+ VIEW  Comparison: 10/30/2006.  Findings: Left proximal femur intertrochanteric fracture with varus impaction.  The fracture is mildly comminuted.  There is anterior displacement evident on the cross-table lateral view.  Left femoral head remains normally located.  Left pelvis and proximal right femur appear intact.  IMPRESSION: Comminuted left intertrochanteric femur fracture with varus impaction and anterior displacement.  Original Report Authenticated By: Harley Hallmark, M.D.   Dg Femur Left  07/03/2011  *RADIOLOGY REPORT*  Clinical Data: Left hip fracture  LEFT FEMUR - 2 VIEW  Comparison: Preoperative exam same date  Findings: Five intraoperative fluoroscopic images demonstrate dynamic nail fixation of the previously seen left intertrochanteric femoral fracture.  Fracture fragments are in near anatomic alignment.  No hardware failure.  IMPRESSION: Expected intraoperative appearance after dynamic nail fixation of intertrochanteric left femoral fracture.  Original Report  Authenticated By: Harrel Lemon, M.D.   Dg Pelvis Portable  07/03/2011  *RADIOLOGY REPORT*  Clinical Data: Postoperative exam after ORIF left hip  PORTABLE PELVIS  Comparison: Intraoperative and preoperative imaging same date  Findings: Lumbar scoliosis noted.  No displaced pelvic fracture. Visualized portions of dynamic left femoral nail fixing previously seen intertrochanteric femoral fracture are within normal limits.  IMPRESSION: Expected postoperative appearance after left femoral dynamic nail fixation of intertrochanteric fracture.  Original Report Authenticated By: Harrel Lemon, M.D.   Dg Hip Portable 1 View Left  07/03/2011  *RADIOLOGY REPORT*  Clinical Data: ORIF left hip  PORTABLE LEFT HIP - 1 VIEW  Comparison: 07/03/2011  Findings: Fracture fragments are in near anatomic alignment after left dynamic nail fixation of intertrochanteric fracture.  No evidence for hardware failure.  IMPRESSION: Expected postoperative appearance after ORIF left intertrochanteric femoral fracture.  Original Report Authenticated By: Harrel Lemon, M.D.   Dg C-arm 21-120 Min-no Report  07/03/2011  CLINICAL DATA: fractured left hip   C-ARM 61-120 MINUTES  Fluoroscopy was utilized by the requesting physician.  No radiographic  interpretation.      Discharge Orders    Future Orders Please Complete By Expires   Diet - low sodium heart healthy      Call MD / Call 911      Comments:   If you experience chest pain or shortness of breath, CALL 911 and be transported to the hospital emergency room.  If you develope a fever above 101 F, pus (white drainage) or increased drainage or redness at the wound, or calf pain, call your surgeon's office.   Constipation Prevention      Comments:   Drink plenty of fluids.  Prune juice may be helpful.  You may use a stool softener, such as Colace (over the counter) 100 mg twice a day.  Use MiraLax (over the counter) for constipation as needed.   Increase activity  slowly as tolerated      Weight Bearing as taught in Physical Therapy      Comments:   Use a walker or crutches as instructed.   Discharge instructions      Comments:   Keep incision clean and dry.  Leave steri strips in place.  May shower 5 days from surgery; pat to dry following shower.  May redress with clean, dry dressing if you would like.  Do not apply any lotion/cream/ointment to the incision.         Follow-up Information    Follow up with Alvy Beal, MD in 2 weeks.   Contact information:   [facility] [address], [address] [place] [address] [phone]          Discharge Plan:  discharge to Skilled Nursing Facility/Rehab   Disposition: STABLE    Signed: Gwinda Maine 07/07/2011, 8:07 AM

## 2011-07-07 NOTE — Progress Notes (Addendum)
Subjective: Procedure(s) (LRB): INTRAMEDULLARY (IM) NAIL FEMORAL (Left) 4 Days Post-Op  Patient reports pain as moderate.  reports incisional pain   Positive void Negative bowel movement Positive flatus Negative chest pain or shortness of breath  Objective: Vital signs in last 24 hours: Temp:  [97.3 F (36.3 C)-98.5 F (36.9 C)] 97.5 F (36.4 C) (04/29 0435) Pulse Rate:  [91-111] 91  (04/29 0435) Resp:  [16] 16  (04/29 0435) BP: (93-125)/(52-70) 125/69 mmHg (04/29 0435) SpO2:  [98 %-100 %] 100 % (04/29 0435)  Intake/Output from previous day: 04/28 0701 - 04/29 0700 In: 275 [P.O.:275] Out: -   No results found for this basename: WBC:2,RBC:2,HCT:2,PLT:2 in the last 72 hours No results found for this basename: NA:2,K:2,CL:2,CO2:2,BUN:2,CREATININE:2,GLUCOSE:2,CALCIUM:2 in the last 72 hours  Basename 07/07/11 0405 07/06/11 0424  LABPT -- --  INR 1.19 1.15    ABD soft Neurovascular intact Sensation intact distally Intact pulses distally Dorsiflexion/Plantar flexion intact Incision: dressing C/D/I Compartment soft  Assessment/Plan: Patient stable  Mobilization with physical therapy today while waiting discharge Discharge to [facility]  Isabella George 07/07/2011, 7:48 AM Agree with above Await SNF d/c Continue to encourage mobilization Coumadin  For 3 weeks total then convert to ASA

## 2011-07-08 DIAGNOSIS — I872 Venous insufficiency (chronic) (peripheral): Secondary | ICD-10-CM

## 2011-07-08 DIAGNOSIS — I729 Aneurysm of unspecified site: Secondary | ICD-10-CM

## 2011-07-08 HISTORY — DX: Aneurysm of unspecified site: I72.9

## 2011-07-08 HISTORY — DX: Venous insufficiency (chronic) (peripheral): I87.2

## 2011-07-09 ENCOUNTER — Encounter (HOSPITAL_COMMUNITY): Payer: Self-pay | Admitting: Orthopedic Surgery

## 2011-07-14 ENCOUNTER — Other Ambulatory Visit (HOSPITAL_COMMUNITY): Payer: Self-pay | Admitting: *Deleted

## 2011-07-15 ENCOUNTER — Encounter (HOSPITAL_COMMUNITY)
Admission: RE | Admit: 2011-07-15 | Discharge: 2011-07-15 | Disposition: A | Discharge status: Home / Self Care | Payer: Medicare Other | Source: Ambulatory Visit | Attending: Internal Medicine | Admitting: Internal Medicine

## 2011-07-15 DIAGNOSIS — D649 Anemia, unspecified: Secondary | ICD-10-CM | POA: Insufficient documentation

## 2011-07-15 LAB — ABO/RH: ABO/RH(D): O POS

## 2011-07-15 LAB — PREPARE RBC (CROSSMATCH)

## 2011-07-15 MED ORDER — FUROSEMIDE 10 MG/ML IJ SOLN
20.0000 mg | Freq: Once | INTRAMUSCULAR | Status: AC
  Administered 2011-07-15: 20 mg via INTRAVENOUS
  Filled 2011-07-15 (×2): qty 2

## 2011-07-15 MED ORDER — OXYCODONE-ACETAMINOPHEN 5-325 MG PO TABS
1.0000 | ORAL_TABLET | Freq: Three times a day (TID) | ORAL | Status: DC | PRN
  Administered 2011-07-15: 1 via ORAL
  Filled 2011-07-15: qty 1

## 2011-07-15 NOTE — Progress Notes (Signed)
VOID INCONTINENTLY X 3 OF LARGE AMOUNT 1ST TIME

## 2011-07-15 NOTE — Discharge Instructions (Signed)
Blood Transfusion   A blood transfusion replaces your blood or some of its parts. Blood is replaced when you have lost blood because of surgery, an accident, or for severe blood conditions like anemia.  You can donate blood to be used on yourself if you have a planned surgery. If you lose blood during that surgery, your own blood can be given back to you.  Any blood given to you is checked to make sure it matches your blood type. Your temperature, blood pressure, and heart rate (vital signs) will be checked often.   GET HELP RIGHT AWAY IF:    You feel sick to your stomach (nauseous) or throw up (vomit).   You have watery poop (diarrhea).   You have shortness of breath or trouble breathing.   You have blood in your pee (urine) or have dark colored pee.   You have chest pain or tightness.   Your eyes or skin turn yellow (jaundice).   You have a temperature by mouth above 102 F (38.9 C), not controlled by medicine.   You start to shake and have chills.   You develop a a red rash (hives) or feel itchy.   You develop lightheadedness or feel confused.   You develop back, joint, or muscle pain.   You do not feel hungry (lost appetite).   You feel tired, restless, or nervous.   You develop belly (abdominal) cramps.  Document Released: 05/23/2008 Document Revised: 02/13/2011 Document Reviewed: 05/23/2008  Exit[employer] Patient Information 2012 [employer].

## 2011-07-16 LAB — TYPE AND SCREEN
Antibody Screen: NEGATIVE
Unit division: 0

## 2011-07-25 DIAGNOSIS — R5381 Other malaise: Secondary | ICD-10-CM

## 2011-07-25 HISTORY — DX: Other malaise: R53.81

## 2011-11-21 ENCOUNTER — Other Ambulatory Visit: Payer: Self-pay | Admitting: Obstetrics and Gynecology

## 2011-11-24 NOTE — Telephone Encounter (Signed)
I will ask appointment desk to pls call her and schedule CE.

## 2011-12-11 ENCOUNTER — Other Ambulatory Visit: Payer: Self-pay | Admitting: Obstetrics and Gynecology

## 2011-12-11 DIAGNOSIS — Z1231 Encounter for screening mammogram for malignant neoplasm of breast: Secondary | ICD-10-CM

## 2011-12-17 DIAGNOSIS — M1990 Unspecified osteoarthritis, unspecified site: Secondary | ICD-10-CM

## 2011-12-17 HISTORY — DX: Unspecified osteoarthritis, unspecified site: M19.90

## 2011-12-22 ENCOUNTER — Ambulatory Visit (INDEPENDENT_AMBULATORY_CARE_PROVIDER_SITE_OTHER): Payer: Medicare Other | Admitting: Obstetrics and Gynecology

## 2011-12-22 ENCOUNTER — Encounter: Payer: Self-pay | Admitting: Obstetrics and Gynecology

## 2011-12-22 VITALS — BP 110/60

## 2011-12-22 DIAGNOSIS — N393 Stress incontinence (female) (male): Secondary | ICD-10-CM

## 2011-12-22 DIAGNOSIS — N952 Postmenopausal atrophic vaginitis: Secondary | ICD-10-CM

## 2011-12-22 DIAGNOSIS — M810 Age-related osteoporosis without current pathological fracture: Secondary | ICD-10-CM

## 2011-12-22 DIAGNOSIS — S72009A Fracture of unspecified part of neck of unspecified femur, initial encounter for closed fracture: Secondary | ICD-10-CM

## 2011-12-22 NOTE — Progress Notes (Signed)
Patient came to see me today for further follow up. We have treated  her for 3 years with IV  Reclast. She is due again in Dec. 2013. This spring she fractured her left hip when she fell on a hard floor in her kitchen. Dr. Shon Baton did her surgery. Her last bone density was January, 2012. She also suffers from urgency incontinence with frequency. We started her on Toviaz 4mg . But the problem persists. She also has atrophic vaginitis but is not sexually active. She has scheduled mammogram for the end of October. She is having no vaginal bleeding or pelvic pain. She has always had normal pap smears. Her last pap was 2010.She is having no pelvic pain or vaginal bleeding.  ROS: 12 System review. Pertinent positives above. Other positives include heart burn , previous breast cancer.  Physical examination:Isabella George present. HEENT within normal limits. Neck: Thyroid not large. No masses. Supraclavicular nodes: not enlarged. Breasts: Examined in both sitting and lying  position. No skin changes and no masses in right breast. Status post mastectomy in left breast with no masses. Abdomen: Soft no guarding rebound or masses or hernia. Pelvic: External: Within normal limits. BUS: Within normal limits. Vaginal:within normal limits. Poor estrogen effect. No evidence of cystocele rectocele or enterocele. Cervix: clean. Uterus: Normal size and shape. Adnexa: No masses. Rectovaginal exam: Confirmatory and negative. Extremities: Within normal limits.  Assessment: #1. Osteoporosis, #2. Recent hip fracture #3 detrussor instability #4. Atrophic vaginits #5. Breast Cancer  Plan: Mammogram and Bone Density. I will talk with Dr. Shon Baton. We will then decide about a 4th year of Reclast vs Forteo. Increased Toviaz to 8mg  daily-samples given.

## 2011-12-22 NOTE — Patient Instructions (Signed)
Schedule bone density.    

## 2011-12-23 ENCOUNTER — Other Ambulatory Visit: Payer: Self-pay | Admitting: Neurology

## 2011-12-23 DIAGNOSIS — R413 Other amnesia: Secondary | ICD-10-CM

## 2012-01-02 ENCOUNTER — Ambulatory Visit: Payer: Medicare Other

## 2012-01-05 ENCOUNTER — Ambulatory Visit
Admission: RE | Admit: 2012-01-05 | Discharge: 2012-01-05 | Disposition: A | Discharge status: Home / Self Care | Payer: Medicare Other | Source: Ambulatory Visit | Attending: Obstetrics and Gynecology | Admitting: Obstetrics and Gynecology

## 2012-01-05 DIAGNOSIS — Z1231 Encounter for screening mammogram for malignant neoplasm of breast: Secondary | ICD-10-CM

## 2012-01-06 ENCOUNTER — Ambulatory Visit
Admission: RE | Admit: 2012-01-06 | Discharge: 2012-01-06 | Disposition: A | Discharge status: Home / Self Care | Payer: Medicare Other | Source: Ambulatory Visit | Attending: Neurology | Admitting: Neurology

## 2012-01-06 DIAGNOSIS — R413 Other amnesia: Secondary | ICD-10-CM

## 2012-01-16 ENCOUNTER — Other Ambulatory Visit: Payer: Self-pay | Admitting: Obstetrics and Gynecology

## 2012-02-02 ENCOUNTER — Other Ambulatory Visit: Payer: Self-pay | Admitting: Obstetrics and Gynecology

## 2012-02-03 ENCOUNTER — Other Ambulatory Visit: Payer: Self-pay | Admitting: *Deleted

## 2012-02-03 MED ORDER — FESOTERODINE FUMARATE ER 4 MG PO TB24: 4.0000 mg | ORAL_TABLET | Freq: Every day | ORAL | Status: DC

## 2012-02-13 ENCOUNTER — Telehealth: Payer: Self-pay | Admitting: *Deleted

## 2012-02-13 MED ORDER — FESOTERODINE FUMARATE ER 8 MG PO TB24: 8.0000 mg | ORAL_TABLET | Freq: Every day | ORAL | Status: DC

## 2012-02-13 NOTE — Telephone Encounter (Signed)
Pt called requesting rx for toviaz 8 mg tablets, samples worked good. rx sent.

## 2012-03-11 DIAGNOSIS — R413 Other amnesia: Secondary | ICD-10-CM

## 2012-04-14 ENCOUNTER — Ambulatory Visit (INDEPENDENT_AMBULATORY_CARE_PROVIDER_SITE_OTHER): Payer: Medicare Other | Admitting: Gynecology

## 2012-04-14 ENCOUNTER — Encounter: Payer: Self-pay | Admitting: Gynecology

## 2012-04-14 DIAGNOSIS — N898 Other specified noninflammatory disorders of vagina: Secondary | ICD-10-CM

## 2012-04-14 LAB — WET PREP FOR TRICH, YEAST, CLUE
Clue Cells Wet Prep HPF POC: NONE SEEN
Yeast Wet Prep HPF POC: NONE SEEN

## 2012-04-14 MED ORDER — CLINDAMYCIN PHOSPHATE 2 % VA CREA: 1.0000 | TOPICAL_CREAM | Freq: Every day | VAGINAL | Status: DC

## 2012-04-14 NOTE — Patient Instructions (Signed)
Call us if the vaginal discharge continues. Also if you have any question or vaginal bleeding again call and we will arrange an ultrasound. Call us after seeing the orthopedic doctor as far as treatment for your osteoporosis. You were to ask him whether he has any objection to [place] restarting Reclast or possibly Forteo.

## 2012-04-14 NOTE — Progress Notes (Signed)
Patient presents having noticed a yellow vaginal discharge the other night upon awakening. Her attendant noticed some blood streaking in it although the patient denies that this occurred. She's having no vaginal symptoms such as itching irritation. Having no pelvic symptoms such as cramping or discomfort. No history of bleeding or other abnormalities previously.   Also has a history of osteoporosis on Reclast.  She has received 3 years historically but then had a hip fracture last year in April 2013 and has not received her last year dose.    Exam with Kim assistant Pelvic external BUS vagina with atrophic changes with yellow discharge noted. Cervix atrophic flush with the upper vagina. Bimanual uterus difficult to palpate but no gross masses or tenderness. Adnexa without gross masses or tenderness. Perineal exam grossly normal. Rectal exam without abnormalities or evidence of bleeding.  Assessment and plan: 1. Yellow discharge. Questionable blood in it. No evidence on exam. Reviewed with patient and the attendant the issues of rule out uterine cancer. Options for ultrasound now for endometrial echo assessment versus observation and if any recurrence of bleeding follow up was discussed. Patient is comfortable with waiting at this time and follow up if she has any questionable bleeding. She knows to call and we'll schedule her for an ultrasound.  Wet prep consistent with bacterial vaginosis. We'll treat with Cleocin vaginal cream nightly x7 days at her choice. Follow up if symptoms persist or recur. 2. History of osteoporosis with T score -3.13 March 2010. Has been on Reclast although has not received a dose this past year. Hip fracture April 2013. I had a lengthy review of the current situation with her and her attendant. Options for reinitiating Reclast now without further studies.  She has been off of Reclast for a year and if deterioration on DEXA now does that reflect deterioration while on Reclast  indicating a need to change treatment such as Forteo or deterioration since her last Reclast wore off. The issue as to whether to move to [facility] now given her fracture history regardless as a bone building strategy discussed with her. How to take Forteo as a daily injection for 2 years and the risks to include sarcoma reviewed. If reinitiation of Reclast again whether redoing the DEXA now would be of any benefit reviewed. The patient at this point would prefer to restart Reclast and would not want to do Forteo.  She understands the pitfalls as discussed above. They're meeting with her orthopedic surgeon this Friday have asked her to discuss with him our attention to treat to make sure he has no disagreement with this. She will call back and let me know their discussion and how they will move forward either with Reclast or Forteo.

## 2012-06-23 ENCOUNTER — Non-Acute Institutional Stay: Payer: Medicare Other | Admitting: Geriatric Medicine

## 2012-06-23 VITALS — BP 102/62 | HR 80 | Ht 67.0 in

## 2012-06-23 DIAGNOSIS — R269 Unspecified abnormalities of gait and mobility: Secondary | ICD-10-CM

## 2012-06-23 DIAGNOSIS — F329 Major depressive disorder, single episode, unspecified: Secondary | ICD-10-CM

## 2012-06-23 DIAGNOSIS — I10 Essential (primary) hypertension: Secondary | ICD-10-CM

## 2012-06-23 DIAGNOSIS — G479 Sleep disorder, unspecified: Secondary | ICD-10-CM | POA: Insufficient documentation

## 2012-06-23 NOTE — Assessment & Plan Note (Signed)
Pt's affect remains flat/depressed but she denies low mood. Activity level fluctuates due to daytime sleepiness (re: sleep disturbance). No reported anxiety, does get irritated easily. Appetite is adequate. No antidepressant

## 2012-06-23 NOTE — Assessment & Plan Note (Signed)
No medication used for hypertensive control, BP remains satisfactory.

## 2012-06-23 NOTE — Progress Notes (Signed)
Patient ID: Isabella George, female   DOB: [birth year], 79 y.o.   MRN: [id number] Chief Complaint  Patient presents with  . Medical Managment of Chronic Issues    blood pressure, depression, gait    HPI: This 79 year old female resident of [facility], independent living section, returns to clinic today in followup of her chronic problems. These include hypertension, depression, gait disorder and insomnia. Patient denies any feelings of depression, no changes in her appetite or daily activities. Her activity level remains very low she tells me mostly because she doesn't sleep well at night. Patient repeats that she has not slept since 2007. She recalls she relates that she has had 3 previous sleep studies but feels none  these checked specifically for sleep apnea. She would very much like to have a repeat sleep study done to rule out sleep apnea as a cause for her sleeping problem. Previously studies were done 2008, 2009, 2010. One each in [place], [place] and the [facility].   Patient is participating with physical therapy 3 times a week. She reports her therapist is working mostly with her legs does some minimal stretching and strengthening activities with her upper extremities.  Patient continues to have caregivers with her around the clock to assist with all activities of daily living as well as transfers and ambulation.  Allergies Reviewed   Medications Reviewed   Data Reviewed      Lab [facility], external 12/11/2011 CMP: Sodium 137, Potassium 4.1, glucose 90, BUN 26, Creatinine 0.74. Protein/LFTS WNL CBC: Wbc 6.2, Rbc 4.30, Hgb 13.3, Hct 38.4, Platelet 240 TSH 3.252  12/18/2011 Dr. Alena Bills labs TSH 2.400 Vitamin B12  1686    Review of Systems   DATA OBTAINED: from patient, caregiver GENERAL: Feels well. No fevers, fatigue, change in activity status. No change in appetite, feels she has gained weight (unable to get on scales today) . SKIN: No itch, rash or open  wounds EYES: No eye pain, dryness or itching. No change in vision EARS: No earache, tinnitus, change in hearing NOSE: No congestion, drainage or bleeding MOUTH/THROAT: No mouth or tooth pain. No difficulty chewing or swallowing. Sometimes coughs with eating - food texture is the problem RESPIRATORY: No cough, wheezing, SOB CARDIAC: No chest pain, palpitations. No edema. GI: No abdominal pain. No N/V/D or constipation. No heartburn or reflux.  GU: No dysuria, frequency or urgency.  No change in urine volume or character. Intermittent incontinence MUSCULOSKELETAL: . Gait is unsteady. No recent falls. Complains of stiffness in shoulders left more than the right. Reports she has very tight back muscles, "nothing helps". NEUROLOGIC: No dizziness, fainting, headache, No change in mental status.  PSYCHIATRIC: No anxiety, depression, behavior issue. Poor quality sleep  Physical Exam  Filed Vitals:   06/23/12 1619  BP: 102/62  Pulse: 80   GENERAL APPEARANCE: No acute distress, appropriately groomed, Thin body habitus. Alert, pleasant, moderately conversant. HEAD: Normocephalic, atraumatic EYES: Conjunctiva/lids clear. Pupils round, reactive.  Keeps left eye closed for most of visit  EARS:  Hearing grossly normal. NOSE: No deformity or discharge. MOUTH/THROAT: Lips w/o lesions. Oral mucosa, tongue moist, w/o lesion. Oropharynx w/o redness or lesions.  NECK:  No thyroid tenderness, enlargement or nodule. Posterior neck and upper trapezius muscles very tight LYMPHATICS: No head, neck or supraclavicular adenopathy RESPIRATORY: Breathing is even, unlabored. Lung sounds are clear and full.  CARDIOVASCULAR: Heart RRR. No murmur or extra heart sounds   EDEMA: No peripheral edema.  GASTROINTESTINAL: Abdomen is soft, non-tender, not  distended w/ normal bowel sounds.  MUSCULOSKELETAL: Stiff movement of bilateral shoulders and elbows she is able to extend both arms nearly to straight. Back with significant  kyphosis . Left hand fourth and fifth fingers contracted. Gait not assessed today  NEUROLOGIC: Oriented to time, place, person. Speech clear, no tremor. PSYCHIATRIC: Affect Flat, conversation is appropriate to situation  ASSESSMENT/PLAN  Sleep disturbance, unspecified Non restorative sleep continues to be main problem for this patient. She is taking melatonin nightly "doesn't help". Feels sleepy during the day, some days too sleepy to take a walk or even be out of bed much. Patient request repeating Sleep Study; she feels the previous tests did not look specifically at whether sleep apnea was the cause of her sleep problem. I do not have reports of the previous tests to review. Will schedule Sleep Study  Depressive disorder, not elsewhere classified Pt's affect remains flat/depressed but she denies low mood. Activity level fluctuates due to daytime sleepiness (re: sleep disturbance). No reported anxiety, does get irritated easily. Appetite is adequate. No antidepressant   Unspecified essential hypertension No medication used for hypertensive control, BP remains satisfactory.   Abnormality of gait Patient remains with very little ambulatory ambulation. She does walk in the hallway with a walker nearly daily. She cannot walk without the assistance of walker and supervision of a caregiver. No recent falls.    Follow up: 6 months or prn  Reylynn Vanalstine T.Severn Goddard, NP-C 06/23/2012

## 2012-06-23 NOTE — Assessment & Plan Note (Signed)
Non restorative sleep continues to be main problem for this patient. She is taking melatonin nightly "doesn't help". Feels sleepy during the day, some days too sleepy to take a walk or even be out of bed much. Patient request repeating Sleep Study; she feels the previous tests did not look specifically at whether sleep apnea was the cause of her sleep problem. I do not have reports of the previous tests to review. Will schedule Sleep Study

## 2012-06-23 NOTE — Assessment & Plan Note (Signed)
Patient remains with very little ambulatory ambulation. She does walk in the hallway with a walker nearly daily. She cannot walk without the assistance of walker and supervision of a caregiver. No recent falls.

## 2012-07-08 ENCOUNTER — Telehealth: Payer: Self-pay | Admitting: Nurse Practitioner

## 2012-08-04 ENCOUNTER — Encounter: Payer: Self-pay | Admitting: Geriatric Medicine

## 2012-08-04 ENCOUNTER — Non-Acute Institutional Stay: Payer: Medicare Other | Admitting: Geriatric Medicine

## 2012-08-04 VITALS — BP 110/72 | HR 72

## 2012-08-04 DIAGNOSIS — G2581 Restless legs syndrome: Secondary | ICD-10-CM | POA: Insufficient documentation

## 2012-08-04 HISTORY — DX: Restless legs syndrome: G25.81

## 2012-08-04 MED ORDER — PRAMIPEXOLE DIHYDROCHLORIDE 0.125 MG PO TABS: 0.1250 mg | ORAL_TABLET | ORAL | Status: DC

## 2012-08-04 NOTE — Progress Notes (Addendum)
Patient ID: Isabella George, female   DOB: November 02, 1932, 79 y.o.   MRN: [id number] Patient ID: Isabella George, female   DOB: 1932-06-11, 79 y.o.   MRN: [id number] Chief Complaint  Patient presents with  . Medical Managment of Chronic Issues    sleep disturbance; review sleep study done 07/14/12    HPI: This 79 year old female resident of [facility], independent living section, returns to clinic today in followup of Diagnostic Sleep Study completed 07/26/12.  Her activity level remains very low  because she doesn't sleep well at night. Patient repeats that she has not slept since 2007. She recalls she has had 3 previous sleep studies but feels none  these checked specifically for sleep apnea. She would very much like to have a repeat sleep study done to rule out sleep apnea as a cause for her sleeping problem. Previously studies were done 2008, 2009, 2010. One each in [place], [place] and the [facility].   Sleep Study revealed no Sleep Apnea, though she does snore. Significant findings are Abnormal Sleep architecture with very minimal REM sleep along with 392 Periodic Limb movements.  Diagnosis given as Restless Leg Syndrome Patient continues to have caregivers with her around the clock to assist with all activities of daily living as well as transfers and ambulation.  Allergies Reviewed   Medications Reviewed   Data Reviewed :  [facility] 07/26/2012 diagnostic sleep study impression: No sleep apnea/hypopnea syndrome noted during the study. Heavy snoring. Abnormal sleep architecture. Nocturnal myoclonus with clinical significance. Reduced sleep efficiency with increased frequency of awakenings. The arousal index was severely abnormal.  Lab [facility], external 12/11/2011 CMP: Sodium 137, Potassium 4.1, glucose 90, BUN 26, Creatinine 0.74. Protein/LFTS WNL CBC: Wbc 6.2, Rbc 4.30, Hgb 13.3, Hct 38.4, Platelet 240 TSH 3.252  12/18/2011 Dr. Alena Bills labs TSH  2.400 Vitamin B12  1686    Review of Systems   DATA OBTAINED: from patient, caregiver GENERAL: Feels well. No fevers, fatigue, change in activity status. No change in appetite, feels she has gained weight (unable to get on scales today) . SKIN: No itch, rash or open wounds EYES: No eye pain, dryness or itching. No change in vision EARS: No earache, tinnitus, change in hearing NOSE: No congestion, drainage or bleeding MOUTH/THROAT: No mouth or tooth pain. No difficulty chewing or swallowing. Sometimes coughs with eating - food texture is the problem RESPIRATORY: No cough, wheezing, SOB CARDIAC: No chest pain, palpitations. No edema. GI: No abdominal pain. No N/V/D or constipation. No heartburn or reflux.  GU: No dysuria, frequency or urgency.  No change in urine volume or character. Intermittent incontinence MUSCULOSKELETAL: . Gait is unsteady. No recent falls. Complains of stiffness in shoulders left more than the right. Reports she has very tight back muscles, "nothing helps". NEUROLOGIC: No dizziness, fainting, headache, No change in mental status.  PSYCHIATRIC: No anxiety, depression, behavior issue. Poor quality sleep  Physical Exam  Filed Vitals:   08/04/12 1019  BP: 110/72  Pulse: 72   GENERAL APPEARANCE: No acute distress, appropriately groomed, Thin body habitus. Alert, pleasant, moderately conversant. HEAD: Normocephalic, atraumatic EYES: Conjunctiva/lids clear. Pupils round, reactive.  Keeps left eye closed for most of visit , request low light in room EARS:  Hearing grossly normal. NOSE: No deformity or discharge. MOUTH/THROAT: Lips w/o lesions. Oral mucosa, tongue moist, w/o lesion. Oropharynx w/o redness or lesions.  NECK:  No thyroid tenderness, enlargement or nodule. Posterior neck and upper trapezius muscles very tight LYMPHATICS:  No head, neck or supraclavicular adenopathy RESPIRATORY: Breathing is even, unlabored. Lung sounds are clear and full.  CARDIOVASCULAR:  Heart RRR. No murmur or extra heart sounds   EDEMA: No peripheral edema.  GASTROINTESTINAL: Abdomen is soft, non-tender, not distended w/ normal bowel sounds.  MUSCULOSKELETAL: Stiff movement of bilateral shoulders and elbows she is able to extend both arms nearly to straight. Back with significant kyphosis . Left hand fourth and fifth fingers contracted. Gait not assessed today  NEUROLOGIC: Oriented to time, place, person. Speech clear, no tremor. PSYCHIATRIC: Affect Flat, conversation is appropriate to situation  ASSESSMENT/PLAN  Restless leg syndrome Diagnosis made by sleep study. Patient reports that she has taken  Mirapex and/or Requip in the past. Doesn't recall how long she took these medicines or if dosages were adjusted. Recommend trial of Mirapex, will start at low dose, if no improvement will titrate up as recommended. Will also check routine lab studies to be sure anemia and/or electrolyte imbalances and not contributing to this problem.   Lab ordered; CBC, CMP, Fe level  Follow up:  6 weeks  Cabrini Ruggieri T.Kollyns Mickelson, NP-C 08/04/2012   After visit phone call from Lafe Garin, caregiver who takes care of this patient's medications. She reports this patient has had an adverse effect re:  ropinirole in the past causing some dizziness and problems with balance. She couldn't recall at what dose this occurred. Also reports the patient has been prescribed Sinemet in the past for restless leg syndrome without good effect. I reviewed my plan for use of Mirapex, she'll obtain the medicine and Mrs. the patient in taking it for the next few days. Will expect to hear from Mrs. Bradburn next Monday regarding effect of this medicine.

## 2012-08-04 NOTE — Assessment & Plan Note (Signed)
Diagnosis made by sleep study. Patient reports that she has taken  Mirapex and/or Requip in the past. Doesn't recall how long she took these medicines or if dosages were adjusted. Recommend trial of Mirapex, will start at low dose, if no improvement will titrate up as recommended. Will also check routine lab studies to be sure anemia and/or electrolyte imbalances and not contributing to this problem.

## 2012-08-24 ENCOUNTER — Other Ambulatory Visit: Payer: Self-pay | Admitting: Gynecology

## 2012-08-24 ENCOUNTER — Other Ambulatory Visit: Payer: Self-pay | Admitting: Obstetrics and Gynecology

## 2012-08-24 MED ORDER — FESOTERODINE FUMARATE ER 8 MG PO TB24: 8.0000 mg | ORAL_TABLET | Freq: Every day | ORAL | Status: DC

## 2012-09-09 ENCOUNTER — Encounter: Payer: Self-pay | Admitting: Gynecology

## 2012-09-09 ENCOUNTER — Telehealth: Payer: Self-pay | Admitting: *Deleted

## 2012-09-09 NOTE — Telephone Encounter (Signed)
(  Dr.G patient) pt called today requesting a Rx for a bra to be faxed to [facility]. If you could write a hand written Rx for this I will fax for the patient. Please advise

## 2012-09-09 NOTE — Telephone Encounter (Signed)
Notes faxed to [phone] with mastectomy supply DX:[phone]. This was how rx was written.

## 2012-09-15 ENCOUNTER — Ambulatory Visit: Payer: Self-pay | Admitting: Nurse Practitioner

## 2012-09-22 ENCOUNTER — Encounter: Payer: Medicare Other | Admitting: Geriatric Medicine

## 2012-10-01 ENCOUNTER — Other Ambulatory Visit: Payer: Self-pay | Admitting: Geriatric Medicine

## 2012-10-01 MED ORDER — EPINEPHRINE 0.3 MG/0.3ML IJ SOAJ: 0.3000 mg | Freq: Once | INTRAMUSCULAR | Status: DC

## 2012-10-11 ENCOUNTER — Other Ambulatory Visit: Payer: Self-pay | Admitting: Geriatric Medicine

## 2012-10-13 ENCOUNTER — Non-Acute Institutional Stay: Payer: Medicare Other | Admitting: Geriatric Medicine

## 2012-10-13 ENCOUNTER — Encounter: Payer: Self-pay | Admitting: Geriatric Medicine

## 2012-10-13 ENCOUNTER — Other Ambulatory Visit: Payer: Self-pay | Admitting: Geriatric Medicine

## 2012-10-13 VITALS — BP 114/64 | HR 80

## 2012-10-13 DIAGNOSIS — J310 Chronic rhinitis: Secondary | ICD-10-CM

## 2012-10-13 DIAGNOSIS — M170 Bilateral primary osteoarthritis of knee: Secondary | ICD-10-CM

## 2012-10-13 DIAGNOSIS — G2581 Restless legs syndrome: Secondary | ICD-10-CM

## 2012-10-13 DIAGNOSIS — M1710 Unilateral primary osteoarthritis, unspecified knee: Secondary | ICD-10-CM

## 2012-10-13 NOTE — Assessment & Plan Note (Signed)
Isabella George has not been helpful in reducing patient's rhinitis symptoms, nose continues to run and ICH. Zyrtec has not been helpful in the past either. Stop Isabella George. Try Claritin if her symptoms worsen she should take this medicine in the daytime.

## 2012-10-13 NOTE — Assessment & Plan Note (Signed)
No improvement with medication even at higher doses. All lab studies satisfactory. Stop Mirapex. Recommend no caffeine after noon, add gentle leg stretches at night to hopefully relax muscles and allow more restful sleep. Patient takes multiple vitamins and supplements unsure if these are providing any benefit. Recommend she stops taking coenzyme Q10, B12, melatonin with B vitamins

## 2012-10-13 NOTE — Progress Notes (Signed)
Patient ID: KEARIA YIN, female   DOB: [birth year], 79 y.o.   MRN: [id number] [facility] [phone])  Code Status: No advanced directives on file    Contact Information   Name Relation Home Work Mobile   Holecek,Rick Son [phone]     Jon, Kasparek [phone]  [phone]      Chief Complaint  Patient presents with  . Medical Managment of Chronic Issues    insomnia Mirapex not helping her sleep. Allegra not helping with her allergies    HPI: This 79 year old female resident of [facility], Independent Living section, returns to clinic today in followup of medication management of Restless Leg Syndrome. Diagnostic Sleep Study completed 07/26/12 confirmed this diagnosis . Pramipexole (Mirapex) was started at lowest dose, increased over the next several weeks. Patient reports this medicine has not helped her gain any restful sleep. She does report she is sleeping longer, but sleep is not restful. Denies any dizziness with use of this medicine does report that she feels more sleepy during the day.  Allegra has not helped relieve her rhinitis. She's been taking it at bedtime. Reports she's taking Zyrtec in the past that also was not very helpful.  Patient was evaluated last week by Dr. Despina Hick for bilateral knee pain. He diagnosed her with bilateral knee arthritis and treated with knee injections. Patient requested resumption of physical therapy services as she is not able to walk very much, Dr. Despina Hick agreed with this, Physical therapy is to start today. Patient reports that the knee injections have not reduced her knee pain.   Patient to a trip last week to [facility] for vacation. Reports she had a very nice trip, notes that her sleep was not any different while away.  This patient's activity level remains very low because she doesn't sleep well at night. Patient repeats that she has not slept since 2007. Patient continues to have caregivers with  her around the clock to assist with all activities of daily living as well as transfers and ambulation.   Allergies  Allergen Reactions  . Adhesive (Tape)   . Bee Venom       Medication List       This list is accurate as of: 10/13/12 11:58 AM.  Always use your most recent med list.               aspirin 81 MG tablet  Take 81 mg by mouth daily.     CALCIUM + D PO  Take 1-2 tablets by mouth See admin instructions. Take 2 tablets in am and 1 tablet in pm     donepezil 5 MG tablet  Commonly known as:  ARICEPT  Take one tablet at night for memory     EPINEPHrine 0.3 mg/0.3 mL Soaj injection  Commonly known as:  EPI-PEN  Inject 0.3 mLs (0.3 mg total) into the muscle once.     fesoterodine 8 MG Tb24 tablet  Commonly known as:  TOVIAZ  Take 1 tablet (8 mg total) by mouth daily.     fish oil-omega-3 fatty acids 1000 MG capsule  Take 2 g by mouth daily.     MELATONIN PO  Take by mouth.     omeprazole 20 MG capsule  Commonly known as:  PRILOSEC  Take 40 mg by mouth daily.     RECLAST IV  Inject into the vein.     vitamin C 500 MG tablet  Commonly known as:  ASCORBIC ACID  Take 500  mg by mouth daily.     Vitamin D (Ergocalciferol) 50000 UNITS Caps capsule  Commonly known as:  DRISDOL  Take 1 capsule (50,000 Units total) by mouth every 7 (seven) days.     VITAMIN-B COMPLEX PO  Take 1 capsule by mouth daily.        Data Reviewed :  [facility] 07/26/2012 diagnostic sleep study impression: No sleep apnea/hypopnea syndrome noted during the study. Heavy snoring. Abnormal sleep architecture. Nocturnal myoclonus with clinical significance. Reduced sleep efficiency with increased frequency of awakenings. The arousal index was severely abnormal.  Lab [facility], external 12/11/2011 CMP: Sodium 137, Potassium 4.1, glucose 90, BUN 26, Creatinine 0.74. Protein/LFTS WNL CBC: Wbc 6.2, Rbc 4.30, Hgb 13.3, Hct 38.4, Platelet 240 TSH 3.252  12/18/2011 Dr.  Alena Bills labs TSH 2.400 Vitamin B12  1686   08/10/2012 WBC 5.9, hemoglobin 13.2, hematocrit 38.6, platelets 235  Glucose 81, BUN 18, creatinine 0.62, sodium 139, potassium 3.9. LFTs/proteins WNL.  Iron level 142   Review of Systems   DATA OBTAINED: from patient, caregiver GENERAL: Feels well. No fevers, fatigue, change in activity status. No change in appetite,  SKIN: No itch, rash or open wounds EYES: No eye pain, dryness or itching. No change in vision EARS: No earache, tinnitus, change in hearing NOSE: No congestion, drainage or bleeding MOUTH/THROAT: No mouth or tooth pain. No difficulty chewing or swallowing. Sometimes coughs with eating - food texture is the problem RESPIRATORY: No cough, wheezing, SOB CARDIAC: No chest pain, palpitations. No edema. GI: No abdominal pain. No N/V/D or constipation. No heartburn or reflux.  GU: No dysuria, frequency or urgency.  No change in urine volume or character. Intermittent incontinence MUSCULOSKELETAL: . Gait is unsteady, minimal ambulation, PT to start today. No recent falls. Complains of stiffness in shoulders left more than the right. Reports she has very tight back muscles, "nothing helps". NEUROLOGIC: No dizziness, fainting, headache, No change in mental status.  PSYCHIATRIC: No anxiety, depression, behavior issue. Poor quality sleep  Physical Exam  Filed Vitals:   10/13/12 0915  BP: 114/64  Pulse: 80   GENERAL APPEARANCE: No acute distress, appropriately groomed, Thin body habitus. Alert, pleasant, moderately conversant. HEAD: Normocephalic, atraumatic EYES: Conjunctiva/lids clear. Pupils round, reactive.  Keeps left eye closed for most of visit , request low light in room EARS:  Hearing grossly normal. NOSE: No deformity or discharge. MOUTH/THROAT: Lips w/o lesions. Oral mucosa, tongue moist, w/o lesion. Oropharynx w/o redness or lesions.  NECK:  No thyroid tenderness, enlargement or nodule. Posterior neck and upper trapezius  muscles very tight LYMPHATICS: No head, neck or supraclavicular adenopathy RESPIRATORY: Breathing is even, unlabored. Lung sounds are clear and full.  CARDIOVASCULAR: Heart RRR. No murmur or extra heart sounds   EDEMA: No peripheral edema.  GASTROINTESTINAL: Abdomen is soft, non-tender, not distended w/ normal bowel sounds.  MUSCULOSKELETAL: Stiff movement of bilateral shoulders and elbows she is able to extend both arms nearly to straight. Back with significant kyphosis . Left hand fourth and fifth fingers contracted. Gait not assessed today  NEUROLOGIC: Oriented to time, place, person. Speech clear, no tremor. PSYCHIATRIC: Affect Flat, conversation is appropriate to situation  ASSESSMENT/PLAN  Restless leg syndrome No improvement with medication even at higher doses. All lab studies satisfactory. Stop Mirapex. Recommend no caffeine after noon, add gentle leg stretches at night to hopefully relax muscles and allow more restful sleep. Patient takes multiple vitamins and supplements unsure if these are providing any benefit. Recommend she stops taking  coenzyme Q10, B12, melatonin with B vitamins  Rhinitis, non-allergic Allegra has not been helpful in reducing patient's rhinitis symptoms, nose continues to run and ICH. Zyrtec has not been helpful in the past either. Stop Allegra. Try Claritin if her symptoms worsen she should take this medicine in the daytime.  Osteoarthritis of both knees New diagnosis from Dr.Alusio. Knee injections have not reduced pain. Pt. To start PT to improve mobility.   Follow up:  2 months as scheduled  Ramy Greth T.Mica Releford, NP-C 10/13/2012

## 2012-10-13 NOTE — Assessment & Plan Note (Signed)
New diagnosis from Dr.Alusio. Knee injections have not reduced pain. Pt. To start PT to improve mobility.

## 2012-10-14 ENCOUNTER — Other Ambulatory Visit: Payer: Self-pay | Admitting: Geriatric Medicine

## 2012-10-14 MED ORDER — PRAMIPEXOLE DIHYDROCHLORIDE 0.125 MG PO TABS: ORAL_TABLET | ORAL | Status: DC

## 2012-10-22 ENCOUNTER — Ambulatory Visit: Payer: Medicare Other | Admitting: Nurse Practitioner

## 2012-10-27 ENCOUNTER — Encounter: Payer: Self-pay | Admitting: Internal Medicine

## 2012-11-26 ENCOUNTER — Other Ambulatory Visit: Payer: Self-pay

## 2012-11-26 MED ORDER — DONEPEZIL HCL 5 MG PO TABS: 5.0000 mg | ORAL_TABLET | Freq: Every evening | ORAL | Status: DC

## 2012-11-26 NOTE — Telephone Encounter (Signed)
Isabella George called requesting a refill on Aricept.  This is a former [facility] patient who has not yet been assigned a new provider.  He has an appt with CM this month.  Sent one refill via [facility].  New provider will be assigned at [facility].

## 2012-12-22 ENCOUNTER — Non-Acute Institutional Stay: Payer: Medicare Other | Admitting: Geriatric Medicine

## 2012-12-22 ENCOUNTER — Encounter: Payer: Self-pay | Admitting: Geriatric Medicine

## 2012-12-22 VITALS — BP 112/68 | HR 80 | Ht 67.0 in

## 2012-12-22 DIAGNOSIS — M810 Age-related osteoporosis without current pathological fracture: Secondary | ICD-10-CM

## 2012-12-22 DIAGNOSIS — G2581 Restless legs syndrome: Secondary | ICD-10-CM

## 2012-12-22 DIAGNOSIS — M170 Bilateral primary osteoarthritis of knee: Secondary | ICD-10-CM

## 2012-12-22 DIAGNOSIS — M1710 Unilateral primary osteoarthritis, unspecified knee: Secondary | ICD-10-CM

## 2012-12-22 DIAGNOSIS — Z79899 Other long term (current) drug therapy: Secondary | ICD-10-CM | POA: Insufficient documentation

## 2012-12-22 NOTE — Progress Notes (Signed)
Patient ID: Isabella George, female   DOB: 1932/12/12, 79 y.o.   MRN: [id number]  Laboratory studies [facility]) 12/21/2012: 30 BC 5.2, hemoglobin 13.1, hematocrit 39.2, platelets 249.  Glucose 88, BUN 13, creatinine 0.71, sodium 138, potassium 3.9. LFTs/protein levels with WNL.  TSH 3.14

## 2012-12-22 NOTE — Progress Notes (Signed)
Patient ID: Isabella George, female   DOB: April 06, 1932, 79 y.o.   MRN: [id number] [facility] [phone])  Code Status: No advanced directives on file        Contact Information   Name Relation Home Work Mobile   Lindseth,Rick Son [phone]     Isabella, George [phone]  [phone]      Chief Complaint  Patient presents with  . Medical Managment of Chronic Issues    restless legs, rhinitis, osteoarthritis. With Isabella George from [facility]. Son wants to know if patient is getting too much , Niacin, Vitamin D, Vitamin C    HPI: This 79 year old female resident of [facility], Independent Living section, returns to clinic today in followup of chronic issues and lab results. Restless Leg Syndrome has not changed, patient continues to report that she is not getting restful sleep at night. Caregiver does report patient is noted to be snoring at times and sometimes does talk in her sleep. Pramipexole was stopped after last visit as it was ineffective. Continues to feel sleepy during the day. He is scheduled to return to neurology tomorrow.   No specific c/o re: rhinitis today.  At last visit, patient requested resumption of physical therapy services as she is not able to walk very much (OA of knees). Patient has completed sessions with PT, no significant improvement in ambulation; walks short distances "some days", does prescribed leg exercises "sometimes". Too tired many days to do excercises.   Patient's son called prior to this visit and request review of vitamin dosages.  Patient is scheduled to followup with GYN, Dr. Audie Box October 21. He will arrange her yearly Reclast injection for osteoporosis.  The patient tells me she is leaving [facility] this Friday, October 17, moving back to her own house. Tells me she has not arranged caregivers (currently has caregivers 8 hours a day 7 days a week), does not want [facility] caregivers. She is  quite adamant about this and expresses in an angry manner that she can take care of herself. She apparently is having conflict with her son Isabella George, feels he is trying to control everything in her life.   Allergies  Allergen Reactions  . Adhesive [Tape]   . Bee Venom       Medication List       This list is accurate as of: 12/22/12  5:28 PM.  Always use your most recent med list.               aspirin 81 MG tablet  Take 81 mg by mouth daily.     CALCIUM + D PO  Take 1-2 tablets by mouth See admin instructions. Take 2 tablets in am and 1 tablet in pm     donepezil 5 MG tablet  Commonly known as:  ARICEPT  Take 1 tablet (5 mg total) by mouth every evening.     EPINEPHrine 0.3 mg/0.3 mL Soaj injection  Commonly known as:  EPI-PEN  Inject 0.3 mLs (0.3 mg total) into the muscle once.     fesoterodine 8 MG Tb24 tablet  Commonly known as:  TOVIAZ  Take 1 tablet (8 mg total) by mouth daily.     fish oil-omega-3 fatty acids 1000 MG capsule  Take 2 g by mouth daily.     MELATONIN PO  Take by mouth.     niacin 50 MG tablet  Take 50 mg by mouth daily with breakfast.     omeprazole 20 MG  capsule  Commonly known as:  PRILOSEC  Take 40 mg by mouth daily.     RECLAST IV  Inject into the vein.     vitamin C 500 MG tablet  Commonly known as:  ASCORBIC ACID  Take 500 mg by mouth daily.     Vitamin D (Ergocalciferol) 50000 UNITS Caps capsule  Commonly known as:  DRISDOL  Take 1 capsule (50,000 Units total) by mouth every 7 (seven) days.     VITAMIN-B COMPLEX PO  Take 1 capsule by mouth daily.       Data Reviewed :  [facility] 07/26/2012 diagnostic sleep study impression: No sleep apnea/hypopnea syndrome noted during the study. Heavy snoring. Abnormal sleep architecture. Nocturnal myoclonus with clinical significance. Reduced sleep efficiency with increased frequency of awakenings. The arousal index was severely abnormal.  Lab [facility],  external 12/11/2011 CMP: Sodium 137, Potassium 4.1, glucose 90, BUN 26, Creatinine 0.74. Protein/LFTS WNL CBC: Wbc 6.2, Rbc 4.30, Hgb 13.3, Hct 38.4, Platelet 240 TSH 3.252  12/18/2011 Dr. Alena Bills labs TSH 2.400 Vitamin B12  1686   08/10/2012 WBC 5.9, hemoglobin 13.2, hematocrit 38.6, platelets 235  Glucose 81, BUN 18, creatinine 0.62, sodium 139, potassium 3.9. LFTs/proteins WNL.  Iron level 142   Review of Systems   DATA OBTAINED: from patient, caregiver GENERAL: Feels well. No fevers, fatigue. No change in appetite. Limited. activity SKIN: No itch, rash or open wounds EYES: No eye pain, dryness or itching. No change in vision EARS: No earache, tinnitus, change in hearing NOSE: No congestion, drainage or bleeding MOUTH/THROAT: No mouth or tooth pain. No difficulty chewing or swallowing. Sometimes coughs with eating - food texture is the problem RESPIRATORY: No cough, wheezing, SOB CARDIAC: No chest pain, palpitations. No edema. GI: No abdominal pain. No N/V/D or constipation. No heartburn or reflux.  GU: No dysuria, frequency or urgency.  No change in urine volume or character. Intermittent incontinence MUSCULOSKELETAL: . Gait is unsteady, minimal ambulation. No recent falls. Complains of stiffness in shoulders left more than the right. Reports she has very tight back muscles, "nothing helps". NEUROLOGIC: No dizziness, fainting, headache, No change in mental status.  PSYCHIATRIC: No anxiety, depression, behavior issue. Poor quality sleep  Physical Exam  Filed Vitals:   12/22/12 1332  BP: 112/68  Pulse: 80  There is no weight on file to calculate BMI. unable to stand to get on the scale today  GENERAL APPEARANCE: No acute distress, appropriately groomed, Thin body habitus. Alert, pleasant, moderately conversant. HEAD: Normocephalic, atraumatic EYES: Conjunctiva/lids clear. Pupils round, reactive.  Keeps left eye closed  EARS:  Hearing grossly normal. NOSE: No deformity or  discharge. MOUTH/THROAT: Lips w/o lesions. Oral mucosa, tongue moist, w/o lesion. Oropharynx w/o redness or lesions.  NECK:  No thyroid tenderness, enlargement or nodule. Posterior neck and upper trapezius muscles very tight LYMPHATICS: No head, neck or supraclavicular adenopathy RESPIRATORY: Breathing is even, unlabored. Lung sounds are clear and full.  CARDIOVASCULAR: Heart RRR. No murmur or extra heart sounds   EDEMA: No peripheral edema.  GASTROINTESTINAL: Abdomen is soft, non-tender, not distended w/ normal bowel sounds.  MUSCULOSKELETAL: Stiff movement of bilateral shoulders and elbows she is able to extend both arms nearly to straight. Back with significant kyphosis . Left hand fourth and fifth fingers contracted. Gait not assessed today, is able to slowly bend each knee and lift leg off wheelchair NEUROLOGIC: Oriented to time, place, person. Speech clear, no tremor. PSYCHIATRIC: Affect mostly flat, conversation is appropriate to situation  ASSESSMENT/PLAN   Patient's decision to move from [facility] without any caregivers arranged is not responsible decision. I will discuss this further with the patient's son, am certain he is made better arrangements and she tells me about today.  Osteoarthritis of both knees Recent diagnosis by orthopedics, patient has completed course of physical therapy with minimal improvement in ambulation. The patient tells me she is able to walk however caregiver endorses the patient frequently declines to walk or exercise because she is tired.  Restless leg syndrome No change in long-standing problem of poor sleep quality. Medications have been tried to reduce effects of restless leg syndrome have been ineffective. For neurology followup tomorrow  Encounter for long-term (current) use of other medications Patient takes quite a long list of supplements, these are mostly self prescribed. Dosages do not seem to be a problem. Son expressed concern about 50 mg dose  of niacin daily, I do not find any evidence that this dose would cause adverse effects.   Follow up:  3 months  Lenard Kampf T.Josue Falconi, NP-C 12/22/2012

## 2012-12-22 NOTE — Assessment & Plan Note (Signed)
Patient takes quite a long list of supplements, these are mostly self prescribed. Dosages do not seem to be a problem. Son expressed concern about 50 mg dose of niacin daily, I do not find any evidence that this dose would cause adverse effects.

## 2012-12-22 NOTE — Assessment & Plan Note (Signed)
Recent diagnosis by orthopedics, patient has completed course of physical therapy with minimal improvement in ambulation. The patient tells me she is able to walk however caregiver endorses the patient frequently declines to walk or exercise because she is tired.

## 2012-12-22 NOTE — Assessment & Plan Note (Signed)
No change in long-standing problem of poor sleep quality. Medications have been tried to reduce effects of restless leg syndrome have been ineffective. For neurology followup tomorrow

## 2012-12-23 ENCOUNTER — Encounter: Payer: Self-pay | Admitting: Nurse Practitioner

## 2012-12-23 ENCOUNTER — Ambulatory Visit (INDEPENDENT_AMBULATORY_CARE_PROVIDER_SITE_OTHER): Payer: Medicare Other | Admitting: Nurse Practitioner

## 2012-12-23 VITALS — BP 109/67 | HR 83

## 2012-12-23 DIAGNOSIS — G3184 Mild cognitive impairment, so stated: Secondary | ICD-10-CM

## 2012-12-23 DIAGNOSIS — R413 Other amnesia: Secondary | ICD-10-CM

## 2012-12-23 NOTE — Progress Notes (Signed)
[facility]  PATIENT: Isabella George DOB: [birth year]   REASON FOR VISIT: Mild cognitive impairment   HISTORY OF PRESENT ILLNESS:Isabella George 79 -year-old white female returns for followup. She was last seen 03/18/2012 and is a previous patient of Dr. Sandria Manly. She says that her memory is stable. She continues to live in independent living at [facility] with 24/ 7 care however she is planning on moving back to her home in the next month or so. No recent falls   03/18/12: Returns for f/u. MRI of the brain with mild atrophy and SVD. Memory labs normal. Neuropsych eval mild cognitive impairment.Pt and c/g say memory has not changed since last visit. Marland Kitchen   HISTORY: Isabella George, 79 year old right-handed white widowed female who lives in independent living at [facility] with 24 hour aides 7 days per week since she fell in her kitchen 07/03/2011 fracturing her left hip which required open reduction and internal fixation . She has a history of progressive gait disorder of unknown etiology first seen by me 07/09/2004. She has no neck pain radiating to the arms, back pain radiating to her legs, or bowel incontinence. She has bladder incontinence without documented memory loss. MRI study of the brain 07/15/2004 did not show hydrocephalus but did show atrophy and small vessel disease. TSH, RPR, sedimentation rate,CPK, and B12 levels have been normal. Sleep studies by Dr. Theressa Millard and Dr.Jay Hazle Coca  showed periodic leg movements. She has osteoporosis with frequent falls sometimes as often as a daily basis but  about 5-10 times in the last year. She is using a wheelchair and a walker with assistance of 2 since her hip fracture. She has had trials of physical therapy. Shey. She complains of numbness on the bottom of her feet when walking and at night while lying down. Trials of Mirapex, Sinemet, Gabapentin, and Requip for her leg symptoms  have not been of benefit. Trials of stimulants and nuvigil  have not helped daytime sleepiness. She has difficulty taking medicines because of side effects. She complains of drowsiness during the day which exacerbates her falling and has aan ESS score of 6 and falls assessment tool score of 21. She had a left mastectomy in 1986 for breast cancer treated by chemotherapy including Adriamycin. She's been evaluated with blood studies that are unremarkable including B12, T4, TSH ANA, ferritin level, glucose, and MRI of the lumbar spine by Dr. Cleophas Dunker. On Nuvigil she couldn't sleep for 2 days , was almost manic, and  never got XYREM covered , could not afford it.She has been seen by Dr. Candice Camp for evaluation  of sleep disorder 08/2010 and was told it showed a leg movement disorder. She has been on gabapentin, Mirapex, ropinirole klonipin, melatonin, Sinemet, Ambien,  alprazolam, and Ativan in the past.Dr. Candice Camp questioned the possibility of PD. She notes memory loss, leg movements during sleep, but no resting tremor. She can bathe herself, dress herself, take care of her toliet  needs, and feed herself. She requires assistance to transfer and  has bladder incontinence.She can operate a  telephone well. She is unable to shop.She can reheat food. She is not participating in  housekeeping tasks or laundry.She can arrange a taxi or transportation if necessary.She is responsible for taking her  medications but does not handle her finances.12/18/2011= MMSE 27/30. Clock drawing task 4/4. Animal fluency test 7. Isabella George  Index of independence in activities of daily living 4. Lawton-Brody  instrumental activities of daily living scale 3.Neuropsychiatric inventory= night time  behavior 8.Geriatric depression scale 5/15. Falls assessment tool score 15.She has a positive family history of dementia  in her mother She denies head trauma. She drinks alcohol socially.  She does not use sleeping pills and has never clinically had a stroke. She did not have a CAT scan at the time of her fall  07/03/2011. 1  REVIEW OF SYSTEMS: Full 14 system review of systems performed and notable only for:  Constitutional: N/A  Cardiovascular: N/A  Ear/Nose/Throat: N/A  Skin: N/A  Eyes: N/A  Respiratory: N/A  Gastroitestinal: N/A  Hematology/Lymphatic: N/A  Endocrine: N/A Musculoskeletal:N/A  Allergy/Immunology: N/A  Neurological: N/A Psychiatric: Not enough sleep ALLERGIES: Allergies  Allergen Reactions  . Adhesive [Tape]   . Bee Venom     HOME MEDICATIONS: Outpatient Prescriptions Prior to Visit  Medication Sig Dispense Refill  . aspirin 81 MG tablet Take 81 mg by mouth daily.      . B Complex Vitamins (VITAMIN-B COMPLEX PO) Take 1 capsule by mouth daily.      . Calcium Carbonate-Vitamin D (CALCIUM + D PO) Take 1-2 tablets by mouth See admin instructions. Take 2 tablets in am and 1 tablet in pm      . donepezil (ARICEPT) 5 MG tablet Take 1 tablet (5 mg total) by mouth every evening.  30 tablet  0  . EPINEPHrine (EPI-PEN) 0.3 mg/0.3 mL SOAJ Inject 0.3 mLs (0.3 mg total) into the muscle once.  1 Device  1  . fesoterodine (TOVIAZ) 8 MG TB24 Take 1 tablet (8 mg total) by mouth daily.  30 tablet  4  . fish oil-omega-3 fatty acids 1000 MG capsule Take 2 g by mouth daily.        Marland Kitchen MELATONIN PO Take by mouth.      . niacin 50 MG tablet Take 50 mg by mouth daily with breakfast.      . omeprazole (PRILOSEC) 20 MG capsule Take 40 mg by mouth daily.       . vitamin C (ASCORBIC ACID) 500 MG tablet Take 500 mg by mouth daily.        . Vitamin D, Ergocalciferol, (DRISDOL) 50000 UNITS CAPS Take 1 capsule (50,000 Units total) by mouth every 7 (seven) days.  8 capsule  6  . Zoledronic Acid (RECLAST IV) Inject into the vein.         No facility-administered medications prior to visit.    PAST MEDICAL HISTORY: Past Medical History  Diagnosis Date  . Osteoporosis   . Cancer     Breast Cancer  . Acid reflux   . Hip fracture   . Depressive disorder, not elsewhere classified 2012  . Anemia,  unspecified   . Acute posthemorrhagic anemia 06/2011  . Malignant neoplasm of breast (female), unspecified site 51    s/p left mastectomy  . Sleep disturbance, unspecified   . Abnormality of gait   . Allergic rhinitis due to pollen   . Unspecified essential hypertension   . Restless leg syndrome 08/04/2012  . Osteoarthritis of both knees   . Rhinitis, non-allergic     PAST SURGICAL HISTORY: Past Surgical History  Procedure Laterality Date  . Appendectomy    . Tonsillectomy    . Anal fissuer    . Anal fissure repair    . Varicose vein surgery      Ligation of vein  . Tongue surgery    . Hand surgery    . Femur im nail  07/03/2011    Procedure: INTRAMEDULLARY (  IM) NAIL FEMORAL;  Surgeon: Venita Lick, MD;  Location: [facility];  Service: Orthopedics;  Laterality: Left;  Synthes troch nail, jackson bed, c-arm  . Hip fracture surgery    . Breast surgery  1986    Left mastectomy  . Tongue surgery  2000    lump on back of tongue Dr. Manson Passey  . Cataract extraction w/ intraocular lens implant  2000    right  . Orif acetabular fracture  07/03/2011    left hip Dr. Shon Baton    FAMILY HISTORY: Family History  Problem Relation Age of Onset  . Heart disease Mother   . Heart disease Brother   . Cancer Father     Liver cancer  . Diabetes Paternal Aunt   . COPD Brother     SOCIAL HISTORY: History   Social History  . Marital Status: Widowed    Spouse Name: N/A    Number of Children: N/A  . Years of Education: N/A   Occupational History  . Not on file.   Social History Main Topics  . Smoking status: Never Smoker   . Smokeless tobacco: Never Used  . Alcohol Use: No  . Drug Use: No  . Sexual Activity: No   Other Topics Concern  . Not on file   Social History Narrative   Lives at [facility]     PHYSICAL EXAM  Filed Vitals:   12/23/12 1428  BP: 109/67  Pulse: 83   Cannot calculate BMI with a height equal to zero.  Generalized: Well developed,kyphotic female seated  in wheelchair in no acute distress  Head: normocephalic and atraumatic,. Oropharynx benign  Neck: Supple, no carotid bruits  Cardiac: Regular rate rhythm, no murmur  Musculoskeletal: Arthritic changes  Neurological examination   Mentation: Alert , MMSE 26/30. Clock drawing 3/4. AFT 5.  Follows all commands speech and language fluent  Cranial nerve II-XII: Fundoscopic exam reveals flat  discs.Pupils were equal round reactive to light extraocular movements were full, visual field were full on confrontational test. Facial sensation and strength were normal. hearing was intact to finger rubbing bilaterally. Uvula tongue midline. head turning and shoulder shrug and were normal and symmetric.Tongue protrusion into cheek strength was normal. Mild masking of the face. Low volume voice Motor: normal bulk and tone, full strength in the BUE, . No focal weakness. No resting tremor no increased tone no cogwheeling. Dupytren's contracture left hand and spooning of the fingers of the left hand. Proximal weakness in the left leg 4/5. Coordination: finger-nose-finger, performed smoothly, unable to perform heel to shin.  Reflexes: Brachioradialis 2/2, biceps 2/2, triceps 2/2, patellar 2/2, Achilles 2/2, plantar responses were flexor bilaterally. Gait and Station: Not ambulated in wheelchair  DIAGNOSTIC DATA (LABS, IMAGING, TESTING) -None to review    ASSESSMENT AND PLAN  79 y.o. year old female  has a past medical history of mild cognitive impairment. TSH and B12 levels were normal MRI of the brain with mild atrophy and SVD. Neuropsych testing suggests mild cognitive impairment.   Pt to continue Aricept 5 mg daily Pt to be assigned to Dr. Anne Hahn F/U in 6 months Nilda Riggs, [facility], [facility], [facility]  [facility] [address], [address] [place], [place] [address] [phone]

## 2012-12-23 NOTE — Patient Instructions (Signed)
Pt to continue Aricept 5 mg daily F/U in 6 months

## 2012-12-23 NOTE — Progress Notes (Signed)
I have read the note, and I agree with the clinical assessment and plan.  WILLIS,CHARLES KEITH   

## 2012-12-28 ENCOUNTER — Ambulatory Visit (INDEPENDENT_AMBULATORY_CARE_PROVIDER_SITE_OTHER): Payer: Medicare Other | Admitting: Gynecology

## 2012-12-28 ENCOUNTER — Encounter: Payer: Self-pay | Admitting: Gynecology

## 2012-12-28 VITALS — BP 124/70

## 2012-12-28 DIAGNOSIS — M810 Age-related osteoporosis without current pathological fracture: Secondary | ICD-10-CM

## 2012-12-28 DIAGNOSIS — R32 Unspecified urinary incontinence: Secondary | ICD-10-CM

## 2012-12-28 DIAGNOSIS — N952 Postmenopausal atrophic vaginitis: Secondary | ICD-10-CM

## 2012-12-28 NOTE — Progress Notes (Signed)
Isabella George 01-25-1933 [id number]        79 y.o.  [id number] for followup exam.  Several issues noted below. Former patient of Dr. Verl Dicker.  Past medical history,surgical history, medications, allergies, family history and social history were all reviewed and documented in the EPIC chart.  ROS:  Performed and pertinent positives and negatives are included in the history, assessment and plan .  Exam: Kim assistant Filed Vitals:   12/28/12 1434  BP: 124/70   General appearance  Normal Skin grossly normal Head/Neck normal with no cervical or supraclavicular adenopathy thyroid normal Lungs  clear Cardiac RR, without RMG Abdominal  soft, nontender, without masses, organomegaly or hernia Right breasts without masses, retractions, discharge or axillary adenopathy. Left chest wall without masses or axillary adenopathy. Well-healed mastectomy scars. Pelvic  Ext/BUS/vagina  atrophic changes   Cervix  difficult to visualize high in the vaginal vault no gross abnormalities  Uterus  difficult to palpate no gross masses or tenderness   Adnexa  Without masses or tenderness    Anus and perineum  normal   Rectovaginal  normal sphincter tone without palpated masses or tenderness.    Assessment/Plan:  79 y.o. [id number] female    1. Osteoporosis. DEXA 2012 T score -3.4. Patient had recent fall with hip fracture 06/2011. Had been on Actonel since 2006. Started on Reclast 2000 through 2013. Issue is to whether to continue with Reclast given a fracture although recognizing she was already osteoporotic and whether there was true progression or not. At this point given her medical condition of being wheelchair-bound I am not sure a DEXA we'll change anything at this point. I do think we should continue to treat her. Options to continue Reclast versus trying a different class of drugs such as Prolia up to and including Forteo. The benefits of Forteo as far as bone building in face of a recent fracture  discussed. The black box warning was rediscussed with her. This has covered with her by Dr. Eda Paschal. The patient declines Forteo but would accept Prolia. We'll plan on initiating Prolia at this point. 2. Atrophic genital changes. Patient is asymptomatic without vaginal itching or irritation. She is not sexually active. 3. Urinary incontinence. Patient has a history of intermittent urinary incontinence. Had used OAB medication in the past. Currently using depends as needed for urinary/fecal issues. At this point treatment not indicated and she is not ambulatory with urgency issues. 4. History of breast cancer status post left mastectomy. Due for mammography this coming month and I reminded patient to schedule. SBE reviewed. 5. Pap smear 2012. No Pap smear done today. No history of significant abnormalities. Recommend stop screening issues over the age of 53 and she agrees. 6. Colonoscopy 2010. Repeat at their recommended interval. 7. Health maintenance. No lab work done today as it is all done through her primary physician's office. Followup for Prolia otherwise annually.  Note: This document was prepared with digital dictation and possible smart phrase technology. Any transcriptional errors that result from this process are unintentional.   Dara Lords MD, 3:03 PM 12/28/2012

## 2012-12-28 NOTE — Patient Instructions (Signed)
Office will help to arrange Prolia injections for the bones.

## 2012-12-29 ENCOUNTER — Other Ambulatory Visit: Payer: Self-pay | Admitting: Neurology

## 2013-01-11 ENCOUNTER — Telehealth: Payer: Self-pay | Admitting: *Deleted

## 2013-01-11 ENCOUNTER — Encounter: Payer: Self-pay | Admitting: Internal Medicine

## 2013-01-11 NOTE — Telephone Encounter (Signed)
Pt was told by Dr.Green NP that she thought it would be okay to stop taking Vit.D 50,000 units because pt is taking calcium 10,000 units? I left message for pt to call at [phone] this was number son left for me to call pt.

## 2013-01-13 NOTE — Telephone Encounter (Signed)
Sure what the calcium 10,000 units means but the answer is measuring a vitamin D level in her blood to see where she stands. I would recommend that she have a vitamin D level checked he can do this at her primary physician's office or here. Based on that value we can decide how much vitamin D supplementation she needs. We also had discussed Prolia at her October 2014 visit and she was supposed to start this I just want to check to see where she is in the process as far as arranging this.

## 2013-01-13 NOTE — Telephone Encounter (Signed)
Patient called. I explained Dr. Kristie Cowman recommendations below. She has not heard anything from [place] about the Prolia so I will forward this to Sherrilyn Rist to follow up.

## 2013-01-13 NOTE — Telephone Encounter (Signed)
Left message for pt to call.

## 2013-01-13 NOTE — Telephone Encounter (Signed)
Pt was told by Dr.Green NP that she thought it would be okay to stop taking Vit.D 50,000 units (given by Dr.G originally) because pt is taking calcium 10,000 units. Pt son asked if you thought this would be okay? Please advise

## 2013-01-14 NOTE — Telephone Encounter (Signed)
Prolia benefits requested KW 

## 2013-01-31 ENCOUNTER — Other Ambulatory Visit: Payer: Self-pay | Admitting: Gynecology

## 2013-01-31 ENCOUNTER — Other Ambulatory Visit: Payer: Self-pay

## 2013-01-31 ENCOUNTER — Telehealth: Payer: Self-pay

## 2013-01-31 MED ORDER — FESOTERODINE FUMARATE ER 4 MG PO TB24: 4.0000 mg | ORAL_TABLET | Freq: Every day | ORAL | Status: DC

## 2013-01-31 NOTE — Telephone Encounter (Signed)
I received staff message from Dr. Velvet Bathe "Olegario Messier, there is request for Toviaz 8 mg from the pharmacy. I did not originally prescribed this am unaware of but if the patient has a good response to Toviaz then I would prefer her to try the 4 mg dose given her age #30 refill x6."  I called the patient. She said Dr. Reece Agar had prescribed this for her originally. I explained to her that Dr. Velvet Bathe not comfortable with that dose but would be willing to let her try the 4 mg. She is agreeable to that and I escribed Rx for her.

## 2013-02-01 ENCOUNTER — Telehealth: Payer: Self-pay

## 2013-02-01 NOTE — Telephone Encounter (Signed)
Revonda Standard from [facility] called asking for order to do a swallowing test. Having trouble swallowing, coughing when laying down. Patient puts nuts in her grits. Dr. Lequita Halt called for [facility] to come in for her knees, but can not give order for swallowing test.  [facility], she gave the order for test. Called [phone] left message on voice mail with diagnosis of esophagitis, hiatal hernia.

## 2013-02-02 ENCOUNTER — Other Ambulatory Visit (HOSPITAL_COMMUNITY): Payer: Self-pay | Admitting: Internal Medicine

## 2013-02-02 DIAGNOSIS — R1310 Dysphagia, unspecified: Secondary | ICD-10-CM

## 2013-02-02 NOTE — Telephone Encounter (Signed)
Pt was  Informed and said she would call back KW

## 2013-02-02 NOTE — Telephone Encounter (Signed)
Pt is coming 02/08/13 in afternoon to receive injection. $0 Out of pocket for patient. NO PA required KW.

## 2013-02-04 ENCOUNTER — Other Ambulatory Visit: Payer: Self-pay | Admitting: Obstetrics and Gynecology

## 2013-02-08 ENCOUNTER — Other Ambulatory Visit: Payer: Medicare Other

## 2013-02-08 ENCOUNTER — Ambulatory Visit (INDEPENDENT_AMBULATORY_CARE_PROVIDER_SITE_OTHER): Payer: Medicare Other | Admitting: Gynecology

## 2013-02-08 ENCOUNTER — Other Ambulatory Visit: Payer: Self-pay | Admitting: *Deleted

## 2013-02-08 DIAGNOSIS — M810 Age-related osteoporosis without current pathological fracture: Secondary | ICD-10-CM

## 2013-02-08 MED ORDER — DENOSUMAB 60 MG/ML ~~LOC~~ SOLN
60.0000 mg | Freq: Once | SUBCUTANEOUS | Status: AC
  Administered 2013-02-08: 60 mg via SUBCUTANEOUS

## 2013-02-14 ENCOUNTER — Ambulatory Visit (HOSPITAL_COMMUNITY)
Admission: RE | Admit: 2013-02-14 | Discharge: 2013-02-14 | Disposition: A | Discharge status: Home / Self Care | Payer: Medicare Other | Source: Ambulatory Visit | Attending: Internal Medicine | Admitting: Internal Medicine

## 2013-02-14 DIAGNOSIS — R1310 Dysphagia, unspecified: Secondary | ICD-10-CM

## 2013-02-14 NOTE — Procedures (Signed)
Objective Swallowing Evaluation: Modified Barium Swallowing Study  Patient Details  Name: Isabella George MRN: [id number] Date of Birth: 1932/04/16  Today's Date: 02/14/2013 Time:  -     Past Medical History:  Past Medical History  Diagnosis Date  . Osteoporosis, senile   . Cancer     Breast Cancer  . Acid reflux   . Hip fracture   . Depressive disorder, not elsewhere classified 2012  . Anemia, unspecified   . Acute posthemorrhagic anemia 06/2011  . Malignant neoplasm of breast (female), unspecified site 63    s/p left mastectomy  . Sleep disturbance, unspecified   . Abnormality of gait   . Allergic rhinitis due to pollen   . Unspecified essential hypertension   . Restless leg syndrome 08/04/2012  . Osteoarthritis of both knees   . Rhinitis, non-allergic   . Memory loss    Past Surgical History:  Past Surgical History  Procedure Laterality Date  . Appendectomy    . Tonsillectomy    . Anal fissuer    . Anal fissure repair    . Varicose vein surgery      Ligation of vein  . Tongue surgery    . Hand surgery    . Femur im nail  07/03/2011    Procedure: INTRAMEDULLARY (IM) NAIL FEMORAL;  Surgeon: Venita Lick, MD;  Location: [facility];  Service: Orthopedics;  Laterality: Left;  Synthes troch nail, jackson bed, c-arm  . Hip fracture surgery    . Breast surgery  1986    Left mastectomy  . Tongue surgery  2000    lump on back of tongue Dr. Manson Passey  . Cataract extraction w/ intraocular lens implant  2000    right  . Orif acetabular fracture  07/03/2011    left hip Dr. Shon Baton  . Esophagogastroduodenoscopy endoscopy  [id number]    Dr. Matthias Hughs with biopsy gastroesophageal junction mucosa with focal ulceration no intestinal metaplasia, dysphasia, or malignancy. Does have a hiatal hernia with esophagitis  . Colonoscopy w/ biopsies  02/21/2011    polyp-hyperplastic polyp, no adenomatous change or malignancy Dr. Matthias Hughs   HPI:  This 79 year old female was referred by Dr. Frederik Pear  for [facility] due to choking on water and coughing during meals.  Pt. is followed by Neuorlogy for memory loss.  ? Parkinson's Disease.     Recommendation/Prognosis  Clinical Impression Dysphagia Diagnosis: Mild pharyngeal phase dysphagia Clinical impression: Pt. exhibits a mild sensori-motor pharyngeal dysphagia, characterized by decreased tongue base contraction to the pharyngeal wall and laryngeal elevetion during the swallow.  This results in silent penetration of thin liquids via cup to the cords, and trace silent aspiration of thin viz straws.  There was also aspiration of thin liquid when pt. was attempting to swallow a pill.  Again, this was silent aspiration, trace aspiration.  Pt.'s caregiver reports that pt. frequently coughs during meals, and one time choked severely with water at home.  Caregiver stated, "It was so bad, I didn't think she was going to come back."  Clinically (per caregiver and SLP report) patient appears to be aspirating more frequently during meals. Swallow Evaluation Recommendations Diet Recommendations: Regular;Nectar-thick liquid;No mixed consistencies Liquid Administration via: Cup;No straw Medication Administration: Whole meds with puree Supervision: Full supervision/cueing for compensatory strategies Compensations: Slow rate;Small sips/bites Postural Changes and/or Swallow Maneuvers: Seated upright 90 degrees;Out of bed for meals;Upright 30-60 min after meal Oral Care Recommendations: Oral care BID;Staff/trained caregiver to provide oral care Other Recommendations: Order thickener from  pharmacy;Prohibited food (jello, ice cream, thin soups);Clarify dietary restrictions Follow up Recommendations: Home health SLP;24 hour supervision/assistance Prognosis Prognosis for Safe Diet Advancement: Guarded Individuals Consulted Consulted and Agree with Results and Recommendations: Patient;Family member/caregiver Family Member Consulted: Debbie- Caregiver  SLP  Assessment/Plan  Defer to Home Health SLP  Short Term Goals: N/a   Defer to [facility] SLP   General:  HPI: This 79 year old female was referred by Dr. Frederik Pear for [facility] due to choking on water and coughing during meals.  Pt. is followed by Neuorlogy for memory loss.  ? Parkinson's Disease. Type of Study: Modified Barium Swallowing Study Reason for Referral: Objectively evaluate swallowing function Previous Swallow Assessment: No previous objective studies found. Diet Prior to this Study: Regular;Thin liquids Temperature Spikes Noted: No Respiratory Status: Room air History of Recent Intubation: No Behavior/Cognition: Alert;Cooperative;Pleasant mood;Requires cueing Oral Cavity - Dentition: Adequate natural dentition Oral Motor / Sensory Function: Within functional limits Self-Feeding Abilities: Able to feed self;Needs set up Patient Positioning: Upright in chair Baseline Vocal Quality: Clear Volitional Cough: Weak Volitional Swallow: Able to elicit Anatomy: Within functional limits Pharyngeal Secretions: Not observed secondary MBS  Reason for Referral:  Objectively evaluate swallowing function   Oral Phase Oral Preparation/Oral Phase Oral Phase: WFL Pharyngeal Phase  Pharyngeal Phase Pharyngeal Phase: Impaired Pharyngeal - Thin Pharyngeal - Thin Cup: Premature spillage to valleculae;Reduced laryngeal elevation;Penetration/Aspiration during swallow;Pharyngeal residue - valleculae;Pharyngeal residue - pyriform sinuses (Double swallows) Penetration/Aspiration details (thin cup): Material enters airway, remains ABOVE vocal cords and not ejected out Pharyngeal - Thin Straw: Reduced laryngeal elevation;Penetration/Aspiration during swallow;Trace aspiration;Pharyngeal residue - valleculae;Pharyngeal residue - pyriform sinuses (repeat dry swallow) Penetration/Aspiration details (thin straw): Material enters airway, CONTACTS cords and not ejected out Pharyngeal - Solids Pharyngeal -  Pill: Penetration/Aspiration during swallow (Penetration to the cords with thin) Penetration/Aspiration details (pill): Material enters airway, CONTACTS cords and not ejected out Cervical Esophageal Phase  Cervical Esophageal Phase Cervical Esophageal Phase: [facility]   G-Codes Functional Assessment Tool Used: Clinical judgement Functional Limitations: Swallowing Swallow Current Status (J1914): At least 1 percent but less than 20 percent impaired, limited or restricted Swallow Goal Status [phone]): At least 1 percent but less than 20 percent impaired, limited or restricted Swallow Discharge Status [phone]): At least 1 percent but less than 20 percent impaired, limited or restricted  Maryjo Rochester T 02/14/2013, 1:50 PM

## 2013-03-14 ENCOUNTER — Other Ambulatory Visit: Payer: Self-pay

## 2013-03-14 DIAGNOSIS — Z1231 Encounter for screening mammogram for malignant neoplasm of breast: Secondary | ICD-10-CM

## 2013-03-14 DIAGNOSIS — Z9012 Acquired absence of left breast and nipple: Secondary | ICD-10-CM

## 2013-03-23 ENCOUNTER — Encounter: Payer: Self-pay | Admitting: Geriatric Medicine

## 2013-03-23 ENCOUNTER — Non-Acute Institutional Stay: Payer: Medicare Other | Admitting: Geriatric Medicine

## 2013-03-23 VITALS — BP 114/64 | HR 80

## 2013-03-23 DIAGNOSIS — M810 Age-related osteoporosis without current pathological fracture: Secondary | ICD-10-CM

## 2013-03-23 DIAGNOSIS — F329 Major depressive disorder, single episode, unspecified: Secondary | ICD-10-CM

## 2013-03-23 DIAGNOSIS — K219 Gastro-esophageal reflux disease without esophagitis: Secondary | ICD-10-CM

## 2013-03-23 DIAGNOSIS — G479 Sleep disorder, unspecified: Secondary | ICD-10-CM

## 2013-03-23 DIAGNOSIS — R269 Unspecified abnormalities of gait and mobility: Secondary | ICD-10-CM

## 2013-03-23 DIAGNOSIS — M1710 Unilateral primary osteoarthritis, unspecified knee: Secondary | ICD-10-CM

## 2013-03-23 DIAGNOSIS — M170 Bilateral primary osteoarthritis of knee: Secondary | ICD-10-CM

## 2013-03-23 DIAGNOSIS — Z79899 Other long term (current) drug therapy: Secondary | ICD-10-CM

## 2013-03-23 DIAGNOSIS — IMO0002 Reserved for concepts with insufficient information to code with codable children: Secondary | ICD-10-CM

## 2013-03-23 DIAGNOSIS — F3289 Other specified depressive episodes: Secondary | ICD-10-CM

## 2013-03-23 DIAGNOSIS — G2581 Restless legs syndrome: Secondary | ICD-10-CM

## 2013-03-23 NOTE — Progress Notes (Signed)
Patient ID: Isabella George, female   DOB: 08-25-1932, 80 y.o.   MRN: [id number]  [facility] [phone])  Code Status: No advanced directives on file        Contact Information   Name Relation Home Work Mobile   Rohlman,Rick Son [phone]     Helmi, Hechavarria [phone]  [phone]      Chief Complaint  Patient presents with  . Medical Managment of Chronic Issues    osteoarthritis, restless legs, trouble swallowing. c/o coughing and chest congestion. With [facility]    HPI: This 80 year old female resident of [facility], Independent Living section, returns to clinic today in followup of chronic issues.   Last visit:  Osteoarthritis of both knees Recent diagnosis by orthopedics, patient has completed course of physical therapy with minimal improvement in ambulation. The patient tells me she is able to walk however caregiver endorses the patient frequently declines to walk or exercise because she is tired.  Restless leg syndrome No change in long-standing problem of poor sleep quality. Medications have been tried to reduce effects of restless leg syndrome have been ineffective. For neurology followup tomorrow  Encounter for long-term (current) use of other medications Patient takes quite a long list of supplements, these are mostly self prescribed. Dosages do not seem to be a problem. Son expressed concern about 50 mg dose of niacin daily, I do not find any evidence that this dose would cause adverse effects.  Since last visit, patient has not had any acute medical issues. She did move from [place] back to her own home in [place] with 24-hour caregiver. She continues to have an apartment reserved at [place].  Prior to this visit patient's son Aaron Edelman left a lengthy telephone message expressing his concerns. He reports patient continues to cough especially while lying down. Note she also coughs with  taking pills. He has asked her to take her pills just one at a time, as a result she has stopped taking taking many of her pills. He reports she continues to take donepezil, melatonin. But is not taking her vitamins and supplements, omeprazole, Toviaz or vitamin D.  Patient continues to deny she needs assistance with daily activities, in reality she is not able to perform any ADLs without significant assistance.   Allergies  Allergen Reactions  . Adhesive [Tape]   . Bee Venom       Medication List       This list is accurate as of: 03/23/13  2:15 PM.  Always use your most recent med list.               aspirin 81 MG tablet  Take 81 mg by mouth daily.     CALCIUM + D PO  Take 1-2 tablets by mouth See admin instructions. Take 2 tablets in am and 1 tablet in pm     chlorpheniramine 4 MG tablet  Commonly known as:  CHLOR-TRIMETON  Take 4 mg by mouth at bedtime.     donepezil 5 MG tablet  Commonly known as:  ARICEPT  TAKE ONE TABLET BY MOUTH IN THE EVENING     EPINEPHrine 0.3 mg/0.3 mL Soaj injection  Commonly known as:  EPI-PEN  Inject 0.3 mLs (0.3 mg total) into the muscle once.     fesoterodine 4 MG Tb24 tablet  Commonly known as:  TOVIAZ  Take 1 tablet (4 mg total) by mouth daily.     fish oil-omega-3 fatty acids 1000  MG capsule  Take 2 g by mouth daily.     MELATONIN PO  Take by mouth.     omeprazole 20 MG capsule  Commonly known as:  PRILOSEC  Take 40 mg by mouth daily.     vitamin C 500 MG tablet  Commonly known as:  ASCORBIC ACID  Take 500 mg by mouth daily.     Vitamin D (Ergocalciferol) 50000 UNITS Caps capsule  Commonly known as:  DRISDOL  TAKE ONE CAPSULE BY MOUTH EVERY 7 DAYS.     VITAMIN-B COMPLEX PO  Take 1 capsule by mouth daily.       DATA REVIEWED  [facility] 07/26/2012 diagnostic sleep study impression: No sleep apnea/hypopnea syndrome noted during the study. Heavy snoring. Abnormal sleep architecture. Nocturnal  myoclonus with clinical significance. Reduced sleep efficiency with increased frequency of awakenings. The arousal index was severely abnormal.  Radiologic studies 02/02/2013 modified barium swallow study:  Mild pharyngeal phase dysphagia  Clinical impression: Pt. exhibits a mild sensori-motor pharyngeal dysphagia, characterized by decreased tongue base contraction to the pharyngeal wall and laryngeal elevetion during the swallow. This results in silent penetration of thin liquids via cup to the cords, and trace silent aspiration of thin viz straws. There was also aspiration of thin liquid when pt. was attempting to swallow a pill. Again, this was silent aspiration, trace aspiration.   Lab [place], external 12/11/2011 CMP: Sodium 137, Potassium 4.1, glucose 90, BUN 26, Creatinine 0.74. Protein/LFTS WNL CBC: Wbc 6.2, Rbc 4.30, Hgb 13.3, Hct 38.4, Platelet 240 TSH 3.252  12/18/2011 Dr. Bernardo Heater labs TSH 2.400 Vitamin B12  1686   08/10/2012 WBC 5.9, hemoglobin 13.2, hematocrit 38.6, platelets 235  Glucose 81, BUN 18, creatinine 0.62, sodium 139, potassium 3.9. LFTs/proteins WNL.  Iron level 142    REVIEW OF SYSTEMS DATA OBTAINED: from patient, caregiver GENERAL: Feels well. No fevers, fatigue. No change in appetite. Limited activity SKIN: No itch, rash or open wounds EYES: No eye pain, dryness or itching. No change in vision EARS: No earache, tinnitus, change in hearing NOSE: No congestion, drainage or bleeding MOUTH/THROAT: No mouth or tooth pain. No difficulty chewing or swallowing. Sometimes coughs with eating - food texture is the problem RESPIRATORY: Cough and congestion especially when lying down, wheezing, SOB CARDIAC: No chest pain, palpitations. No edema. GI: No abdominal pain. No N/V/D or constipation. No heartburn or reflux.  GU: No dysuria, frequency or urgency.  No change in urine volume or character. Intermittent incontinence MUSCULOSKELETAL: . Gait is unsteady, minimal  ambulation. Working with PT twice a week, is not performing any exercise or walking outside the PT sessions No recent falls.  NEUROLOGIC: No dizziness, fainting, headache, No change in mental status.  PSYCHIATRIC: Negative behavior, presents sons influencing interference o anxiety, depression, behavior issue. Poor quality sleep  PHYSICAL EXAM  Filed Vitals:   03/23/13 1314  BP: 114/64  Pulse: 80  There is no weight on file to calculate BMI. unable to stand to get on the scale today  GENERAL APPEARANCE: No acute distress, appropriately groomed, Thin body habitus. Alert, pleasant, moderately conversant. HEAD: Normocephalic, atraumatic EYES: Conjunctiva/lids clear. Pupils round, reactive.  Keeps left eye closed  EARS:  Hearing grossly normal. NOSE: No deformity or discharge. MOUTH/THROAT: Lips w/o lesions. Oral mucosa, tongue moist, w/o lesion. Oropharynx w/o redness or lesions.  NECK:  No thyroid tenderness, enlargement or nodule. Posterior neck and upper trapezius muscles very tight LYMPHATICS: No head, neck or supraclavicular adenopathy RESPIRATORY: Breathing is  even, unlabored. Lung sounds are clear, shallow inspirations CARDIOVASCULAR: Heart RRR. No murmur or extra heart sounds   EDEMA: No peripheral edema.  GASTROINTESTINAL: Abdomen is soft, non-tender, not distended w/ normal bowel sounds.  MUSCULOSKELETAL: Stiff movement of bilateral shoulders and elbows she is able to extend both arms nearly to straight. Back with significant kyphosis . Left hand fourth and fifth fingers contracted. Gait not assessed today, is able to stand from wheelchair only with maximum assistance from caregiver and a gait belt. She is able to stand for a short time with assistance  NEUROLOGIC: Oriented to time, place, person. Speech clear, no tremor. PSYCHIATRIC: Affect mostly flat, attitude is negative, she remarks 'no' to every suggestion  ASSESSMENT/PLAN     Acid reflux Patient has not been taking  omeprazole as prescribed, this is likely contributing to increased congestion and mucus especially while lying down. Have reinforced use of omeprazole as prescribed  Sleep disturbance, unspecified This problem is unchanged, no new interventions at this time.   Osteoarthritis of both knees Patient is participating with physical therapy 2 days a week;  is walking some distance with assist of therapist and caregiver. Little cooperation with caregivers to exercise between therapy visits. Patient is not receptive to suggestions related to increased activity    Encounter for long-term (current) use of other medications Patient has not been taking vitamin supplement meds recently, son is concerned about the effect on her overall health. Will obtain lab studies to measure. Have recommended to patient restarting these medications for overall health.  Time: 45 minutes, >50% spent counseling/or care coordination  Follow up:  4 months  Lab: CBC, CMP, TSH, vitamin D, B12  Rayona Sardinha T.Bliss Tsang, NP-C 03/23/2013

## 2013-03-23 NOTE — Assessment & Plan Note (Signed)
Patient has not been taking omeprazole as prescribed, this is likely contributing to increased congestion and mucus especially while lying down. Have reinforced use of omeprazole as prescribed

## 2013-03-29 ENCOUNTER — Telehealth: Payer: Self-pay | Admitting: Geriatric Medicine

## 2013-03-29 ENCOUNTER — Other Ambulatory Visit: Payer: Self-pay | Admitting: Geriatric Medicine

## 2013-03-29 ENCOUNTER — Other Ambulatory Visit: Payer: Medicare Other

## 2013-03-29 DIAGNOSIS — Z1329 Encounter for screening for other suspected endocrine disorder: Secondary | ICD-10-CM

## 2013-03-29 DIAGNOSIS — M810 Age-related osteoporosis without current pathological fracture: Secondary | ICD-10-CM

## 2013-03-29 DIAGNOSIS — D649 Anemia, unspecified: Secondary | ICD-10-CM

## 2013-03-29 DIAGNOSIS — G3184 Mild cognitive impairment, so stated: Secondary | ICD-10-CM

## 2013-03-29 DIAGNOSIS — R413 Other amnesia: Secondary | ICD-10-CM

## 2013-03-29 DIAGNOSIS — I10 Essential (primary) hypertension: Secondary | ICD-10-CM

## 2013-03-29 DIAGNOSIS — E538 Deficiency of other specified B group vitamins: Secondary | ICD-10-CM

## 2013-03-29 DIAGNOSIS — R634 Abnormal weight loss: Secondary | ICD-10-CM

## 2013-03-29 DIAGNOSIS — E559 Vitamin D deficiency, unspecified: Secondary | ICD-10-CM

## 2013-03-29 NOTE — Telephone Encounter (Signed)
Phone call from patient's son Aaron Edelman today. He requests that methylmalonic acid being added to today's lab tests for further evaluation of B12 deficiency

## 2013-03-30 ENCOUNTER — Other Ambulatory Visit: Payer: Self-pay

## 2013-03-30 LAB — CBC WITH DIFFERENTIAL/PLATELET
BASOS ABS: 0 10*3/uL (ref 0.0–0.2)
Basos: 1 %
EOS ABS: 0.1 10*3/uL (ref 0.0–0.4)
Eos: 2 %
HCT: 42.6 % (ref 34.0–46.6)
Hemoglobin: 14.5 g/dL (ref 11.1–15.9)
Immature Grans (Abs): 0 10*3/uL (ref 0.0–0.1)
Immature Granulocytes: 0 %
LYMPHS: 37 %
Lymphocytes Absolute: 2.4 10*3/uL (ref 0.7–3.1)
MCH: 30.7 pg (ref 26.6–33.0)
MCHC: 34 g/dL (ref 31.5–35.7)
MCV: 90 fL (ref 79–97)
MONOCYTES: 12 %
Monocytes Absolute: 0.8 10*3/uL (ref 0.1–0.9)
NEUTROS ABS: 3.1 10*3/uL (ref 1.4–7.0)
Neutrophils Relative %: 48 %
RBC: 4.73 x10E6/uL (ref 3.77–5.28)
RDW: 13.1 % (ref 12.3–15.4)
WBC: 6.4 10*3/uL (ref 3.4–10.8)

## 2013-03-30 LAB — COMPREHENSIVE METABOLIC PANEL
ALK PHOS: 59 IU/L (ref 39–117)
ALT: 14 IU/L (ref 0–32)
AST: 15 IU/L (ref 0–40)
Albumin/Globulin Ratio: 1.6 (ref 1.1–2.5)
Albumin: 3.9 g/dL (ref 3.5–4.8)
BILIRUBIN TOTAL: 0.5 mg/dL (ref 0.0–1.2)
BUN / CREAT RATIO: 22 (ref 11–26)
BUN: 15 mg/dL (ref 8–27)
CO2: 22 mmol/L (ref 18–29)
Calcium: 9.3 mg/dL (ref 8.7–10.3)
Chloride: 103 mmol/L (ref 97–108)
Creatinine, Ser: 0.68 mg/dL (ref 0.57–1.00)
GFR calc non Af Amer: 84 mL/min/{1.73_m2} (ref >59)
GFR, EST AFRICAN AMERICAN: 97 mL/min/{1.73_m2} (ref >59)
GLUCOSE: 96 mg/dL (ref 65–99)
Globulin, Total: 2.5 g/dL (ref 1.5–4.5)
POTASSIUM: 4.3 mmol/L (ref 3.5–5.2)
Sodium: 140 mmol/L (ref 134–144)
TOTAL PROTEIN: 6.4 g/dL (ref 6.0–8.5)

## 2013-03-30 LAB — VITAMIN D 25 HYDROXY (VIT D DEFICIENCY, FRACTURES): Vit D, 25-Hydroxy: 53 ng/mL (ref 30.0–100.0)

## 2013-03-30 LAB — VITAMIN B12: VITAMIN B 12: 1316 pg/mL — AB (ref 211–946)

## 2013-03-30 LAB — TSH: TSH: 2.81 u[IU]/mL (ref 0.450–4.500)

## 2013-03-31 NOTE — Assessment & Plan Note (Addendum)
This problem is unchanged, no new interventions at this time.

## 2013-03-31 NOTE — Assessment & Plan Note (Signed)
Patient is participating with physical therapy 2 days a week;  is walking some distance with assist of therapist and caregiver. Little cooperation with caregivers to exercise between therapy visits. Patient is not receptive to suggestions related to increased activity

## 2013-03-31 NOTE — Assessment & Plan Note (Signed)
Patient has not been taking vitamin supplement meds recently, son is concerned about the effect on her overall health. Will obtain lab studies to measure. Have recommended to patient restarting these medications for overall health.

## 2013-04-04 ENCOUNTER — Ambulatory Visit
Admission: RE | Admit: 2013-04-04 | Discharge: 2013-04-04 | Disposition: A | Discharge status: Home / Self Care | Payer: Medicare Other | Source: Ambulatory Visit

## 2013-04-04 DIAGNOSIS — Z1231 Encounter for screening mammogram for malignant neoplasm of breast: Secondary | ICD-10-CM

## 2013-04-04 DIAGNOSIS — Z9012 Acquired absence of left breast and nipple: Secondary | ICD-10-CM

## 2013-04-07 ENCOUNTER — Telehealth: Payer: Self-pay | Admitting: Geriatric Medicine

## 2013-04-07 NOTE — Telephone Encounter (Signed)
Phone call from patient's son Gaspar Bidding, he asks to review patient's recent lab results. Reviewed CBC CMP B12 methylmalonic acid TSH and vitamin D results with him. All are within normal limits. Reviewed patient's medication and supplement list. Advised him it is not necessary to give her 50,000 units of vitamin D in addition to the multivitamin  contains vitamin D. Also advised if she is declining fish oil and extra B. vitamin supplements that is okay. Gaspar Bidding also asks about an adjustable bed. He has found a bed that adjusts ahead and foot very nicely however it does  not raise and lower as a whole.. Advised this is probably sufficient for now.

## 2013-04-13 LAB — SPECIMEN STATUS REPORT

## 2013-04-21 LAB — SPECIMEN STATUS REPORT

## 2013-04-21 LAB — METHYLMALONIC ACID, SERUM: Methylmalonic Acid: 258 nmol/L (ref 0–378)

## 2013-05-20 DIAGNOSIS — M62838 Other muscle spasm: Secondary | ICD-10-CM | POA: Insufficient documentation

## 2013-06-15 ENCOUNTER — Encounter: Payer: Self-pay | Admitting: Gynecology

## 2013-06-15 ENCOUNTER — Ambulatory Visit (INDEPENDENT_AMBULATORY_CARE_PROVIDER_SITE_OTHER): Payer: Medicare Other | Admitting: Gynecology

## 2013-06-15 DIAGNOSIS — R159 Full incontinence of feces: Secondary | ICD-10-CM

## 2013-06-15 NOTE — Patient Instructions (Signed)
Follow up as needed

## 2013-06-15 NOTE — Progress Notes (Signed)
Isabella George 07-25-1932 [id number]        80 y.o.  [id number] presents with her caregivers with a concern as to whether she has a rectovaginal fistula. She has both urinary and fecal incontinence and during cleanup episodes the aides at her facility wonder whether persistent perineal fecal staining is due to a fistula.  Past medical history,surgical history, problem list, medications, allergies, family history and social history were all reviewed and documented in the EPIC chart.  Exam: Kim assistant General appearance:  frail elderly female Abdomen grossly normal without masses rebound guarding or tenderness Pelvic external BUS vagina with atrophic changes. Fecal material over perineum and labia bilaterally. Visual and palpable exam without evidence of gross fistula. Cervix difficult to visualize. Uterus difficult to palpate but no gross masses or tenderness on bimanual. Rectovaginal exam shows fair rectal sphincter tone.  Assessment/Plan:  80 y.o. [id number]  urinary and fecal incontinence. Question of rectovaginal fistula by caregivers. No evidence of disc grossly on exam. Exam is somewhat limited. Reviewed with them given her current status even if she did have a rectovaginal fistula given her fecal incontinence already that this really would not change caregiving for her situation at this point I feel no further evaluation needed. They agree with this and will followup as needed.    Note: This document was prepared with digital dictation and possible smart phrase technology. Any transcriptional errors that result from this process are unintentional.   Anastasio Auerbach MD, 11:34 AM 06/15/2013

## 2013-06-22 ENCOUNTER — Ambulatory Visit (INDEPENDENT_AMBULATORY_CARE_PROVIDER_SITE_OTHER): Payer: Medicare Other | Admitting: Nurse Practitioner

## 2013-06-22 ENCOUNTER — Encounter: Payer: Self-pay | Admitting: Nurse Practitioner

## 2013-06-22 ENCOUNTER — Ambulatory Visit: Payer: Self-pay | Admitting: Nurse Practitioner

## 2013-06-22 VITALS — BP 125/78 | HR 89

## 2013-06-22 DIAGNOSIS — G3184 Mild cognitive impairment, so stated: Secondary | ICD-10-CM

## 2013-06-22 DIAGNOSIS — R413 Other amnesia: Secondary | ICD-10-CM

## 2013-06-22 MED ORDER — DONEPEZIL HCL 5 MG PO TABS: 5.0000 mg | ORAL_TABLET | Freq: Every day | ORAL | Status: DC

## 2013-06-22 NOTE — Progress Notes (Signed)
[facility]  PATIENT: Isabella George DOB: 09/19/1932   REASON FOR VISIT: Followup for cognitive impairment   HISTORY OF PRESENT ILLNESS: Isabella George, 80 year old female returns for followup. She has a history of mild cognitive impairment. She is currently on Aricept 5 mg daily without side effects.  MRI of the brain with mild atrophy and SVD. Memory labs normal. Neuropsych eval mild cognitive impairment.Pt and c/g say memory is about the same. When patient was last seen she was living at a skilled facility. She is now back home with CNA's  around-the-clock. She can feed herself but requires assistance for dressing bathing etc. She returns for reevaluation   HISTORY: Isabella George, 80 year old right-handed white widowed female who lives in independent living at [place] with 24 hour aides 7 days per week since she fell in her kitchen 07/03/2011 fracturing her left hip which required open reduction and internal fixation . She has a history of progressive gait disorder of unknown etiology first seen by me 07/09/2004. She has no neck pain radiating to the arms, back pain radiating to her legs, or bowel incontinence. She has bladder incontinence without documented memory loss. MRI study of the brain 07/15/2004 did not show hydrocephalus but did show atrophy and small vessel disease. TSH, RPR, sedimentation rate,CPK, and B12 levels have been normal. Sleep studies by Isabella George and Isabella George showed periodic leg movements. She has osteoporosis with frequent falls sometimes as often as a daily basis but about 5-10 times in the last year. She is using a wheelchair and a walker with assistance of 2 since her hip fracture. She has had trials of physical therapy. [facility]. She complains of numbness on the bottom of her feet when walking and at night while lying down. Trials of Mirapex, Sinemet, Gabapentin, and Requip for her leg symptoms have not been of benefit. Trials of stimulants and  nuvigil have not helped daytime sleepiness. She has difficulty taking medicines because of side effects. She complains of drowsiness during the day which exacerbates her falling and has aan ESS score of 6 and falls assessment tool score of 21. She had a left mastectomy in 1986 for breast cancer treated by chemotherapy including Adriamycin. She's been evaluated with blood studies that are unremarkable including B12, T4, TSH ANA, ferritin level, glucose, and MRI of the lumbar spine by Isabella George. On Nuvigil she couldn't sleep for 2 days , was almost manic, and never got XYREM covered , could not afford it.She has been seen by Isabella George for evaluation of sleep disorder 08/2010 and was told it showed a leg movement disorder. She has been on gabapentin, Mirapex, ropinirole klonipin, melatonin, Sinemet, Ambien, alprazolam, and Ativan in the past.Isabella George questioned the possibility of PD. She notes memory loss, leg movements during sleep, but no resting tremor. She can bathe herself, dress herself, take care of her toliet needs, and feed herself. She requires assistance to transfer and has bladder incontinence.She can operate a telephone well. She is unable to shop.She can reheat food. She is not participating in housekeeping tasks or laundry.She can arrange a taxi or transportation if necessary.She is responsible for taking her medications but does not handle her finances.12/18/2011= MMSE 27/30.She has a positive family history of dementia in her mother She denies head trauma. She drinks alcohol socially. She does not use sleeping pills and has never clinically had a stroke. She did not have a CAT scan at the time of her fall 07/03/2011.  REVIEW OF SYSTEMS: Full 14 system review of systems performed and notable only for those listed, all others are neg:  Constitutional: N/A  Cardiovascular: N/A  Ear/Nose/Throat: N/A  Skin: N/A  Eyes: Light sensitivity Respiratory: N/A  Gastroitestinal: Urinary incontinence   Hematology/Lymphatic: N/A  Endocrine: N/A Musculoskeletal:N/A  Allergy/Immunology: N/A  Neurological: Weakness requires help to transfer Psychiatric: N/A   ALLERGIES: Allergies  Allergen Reactions  . Adhesive [Tape]   . Bee Venom     HOME MEDICATIONS: Outpatient Prescriptions Prior to Visit  Medication Sig Dispense Refill  . aspirin 81 MG tablet Take 81 mg by mouth daily.      . B Complex Vitamins (VITAMIN-B COMPLEX PO) Take 1 capsule by mouth daily.      . Calcium Carbonate-Vitamin D (CALCIUM + D PO) Take 1-2 tablets by mouth See admin instructions. Take 2 tablets in am and 1 tablet in pm      . chlorpheniramine (CHLOR-TRIMETON) 4 MG tablet Take 4 mg by mouth at bedtime.      . donepezil (ARICEPT) 5 MG tablet TAKE ONE TABLET BY MOUTH IN THE EVENING  30 tablet  6  . EPINEPHrine (EPI-PEN) 0.3 mg/0.3 mL SOAJ Inject 0.3 mLs (0.3 mg total) into the muscle once.  1 Device  1  . fish oil-omega-3 fatty acids 1000 MG capsule Take 2 g by mouth daily.        . GuaiFENesin (MUCINEX PO) Take by mouth.      . MELATONIN PO Take 2.5 mg by mouth.       Marland Kitchen omeprazole (PRILOSEC) 20 MG capsule Take 40 mg by mouth daily.       . vitamin C (ASCORBIC ACID) 500 MG tablet Take 500 mg by mouth daily.         No facility-administered medications prior to visit.    PAST MEDICAL HISTORY: Past Medical History  Diagnosis Date  . Osteoporosis, senile   . Cancer     Breast Cancer  . Acid reflux   . Hip fracture   . Depressive disorder, not elsewhere classified 2012  . Anemia, unspecified   . Acute posthemorrhagic anemia 06/2011  . Malignant neoplasm of breast (female), unspecified site 51    s/p left mastectomy  . Sleep disturbance, unspecified   . Abnormality of gait   . Allergic rhinitis due to pollen   . Unspecified essential hypertension   . Restless leg syndrome 08/04/2012  . Osteoarthritis of both knees   . Rhinitis, non-allergic   . Memory loss   . Osteoarthrosis, unspecified whether  generalized or localized, unspecified site 12/17/2011  . Debility, unspecified 07/25/2011  . Aneurysm of unspecified site 07/08/2011  . Unspecified venous (peripheral) insufficiency 07/08/2011  . Closed fracture of intertrochanteric section of femur 07/07/2011  . Dizziness and giddiness 06/23/2011  . Personal history of fall 06/23/2011  . Disturbance of skin sensation 05/26/2011  . Contusion of toe 05/26/2011  . Esophagitis, unspecified 04/14/2011  . Diaphragmatic hernia without mention of obstruction or gangrene 04/14/2011  . Loss of weight 04/14/2011  . Closed fracture of two ribs 04/14/2011  . Ulcer of esophagus 02/25/2011  . Chest pain, unspecified 02/10/2011  . Unspecified hereditary and idiopathic peripheral neuropathy 02/05/2011  . Scoliosis (and kyphoscoliosis), idiopathic   . Unspecified urinary incontinence 12/27/2010  . Closed fracture of rib(s), unspecified 12/27/2010    PAST SURGICAL HISTORY: Past Surgical History  Procedure Laterality Date  . Appendectomy    . Tonsillectomy    . Anal fissuer    .  Anal fissure repair    . Varicose vein surgery      Ligation of vein  . Tongue surgery    . Hand surgery    . Femur im nail  07/03/2011    Procedure: INTRAMEDULLARY (IM) NAIL FEMORAL;  Surgeon: Melina Schools, MD;  Location: [facility];  Service: Orthopedics;  Laterality: Left;  Synthes troch nail, jackson bed, c-arm  . Hip fracture surgery    . Breast surgery  1986    Left mastectomy  . Tongue surgery  2000    lump on back of tongue Dr. Owens Shark  . Cataract extraction w/ intraocular lens implant  2000    right  . Orif acetabular fracture  07/03/2011    left hip Dr. Rolena Infante  . Esophagogastroduodenoscopy endoscopy  [id number]    Dr. Cristina Gong with biopsy gastroesophageal junction mucosa with focal ulceration no intestinal metaplasia, dysphasia, or malignancy. Does have a hiatal hernia with esophagitis  . Colonoscopy w/ biopsies  02/21/2011    polyp-hyperplastic polyp, no adenomatous change or  malignancy Dr. Cristina Gong    FAMILY HISTORY: Family History  Problem Relation Age of Onset  . Heart disease Mother   . Heart disease Brother   . Cancer Father     Liver cancer  . Diabetes Paternal Aunt   . COPD Brother     SOCIAL HISTORY: History   Social History  . Marital Status: Widowed    Spouse Name: N/A    Number of Children: N/A  . Years of Education: N/A   Occupational History  . Not on file.   Social History Main Topics  . Smoking status: Never Smoker   . Smokeless tobacco: Never Used  . Alcohol Use: No  . Drug Use: No  . Sexual Activity: No   Other Topics Concern  . Not on file   Social History Narrative   Lives at [place]   Patient is widowed.    Patient has 2 children.   Patient has 3 years or college.   Patient is retired.            PHYSICAL EXAM  Filed Vitals:   06/22/13 1005  BP: 125/78  Pulse: 89   Cannot calculate BMI with a height equal to zero.  Generalized: Well developed, in no acute distress  Head: normocephalic and atraumatic,. Oropharynx benign  Neck: Supple, no carotid bruits  Cardiac: Regular rate rhythm, no murmur  Musculoskeletal: Arthritic changes  Neurological examination   Mentation: Alert oriented to time, place, history taking. MMSE 28/30. AFT 10. Follows all commands speech and language fluent  Cranial nerve II-XII: Pupils were equal round reactive to light extraocular movements were full, visual field were full on confrontational test. Facial sensation and strength were normal. hearing was intact to finger rubbing bilaterally. Uvula tongue midline. head turning and shoulder shrug were normal and symmetric.Tongue protrusion into cheek strength was normal. Motor: normal bulk and tone, full strength in the BUE, proximal weakness of both lower extremities 4/5. Contracture of the left hand  Coordination: finger-nose-finger, heel-to-shin bilaterally, no dysmetria Reflexes: Brachioradialis 2/2, biceps 2/2, triceps 2/2,  patellar 2/2, Achilles 2/2, plantar responses were flexor bilaterally. Gait and Station: In wheelchair not ambulated   DIAGNOSTIC DATA (LABS, IMAGING, TESTING) - I reviewed patient records, labs, notes, testing and imaging myself where available.  Lab Results  Component Value Date   WBC 6.4 03/29/2013   HGB 14.5 03/29/2013   HCT 42.6 03/29/2013   MCV 90 03/29/2013   PLT 156 07/03/2011  Component Value Date/Time   NA 140 03/29/2013 0849   NA 139 07/03/2011 1150   K 4.3 03/29/2013 0849   CL 103 03/29/2013 0849   CO2 22 03/29/2013 0849   GLUCOSE 96 03/29/2013 0849   GLUCOSE 134* 07/03/2011 1150   BUN 15 03/29/2013 0849   BUN 28* 07/03/2011 1150   CREATININE 0.68 03/29/2013 0849   CREATININE 1.10 02/03/2011 1533   CALCIUM 9.3 03/29/2013 0849   PROT 6.4 03/29/2013 0849   AST 15 03/29/2013 0849   ALT 14 03/29/2013 0849   ALKPHOS 59 03/29/2013 0849   BILITOT 0.5 03/29/2013 0849   GFRNONAA 84 03/29/2013 0849   GFRAA 97 03/29/2013 0849    Lab Results  Component Value Date   VITAMINB12 1316* 03/29/2013   Lab Results  Component Value Date   TSH 2.810 03/29/2013      ASSESSMENT AND PLAN  80 y.o. year old female  has a past medical history of mild cognitive impairment, TSH and B12 levels were normal. MRI of the brain with mild atrophy and small vessel disease. Neuropsych testing suggests mild cognitive impairment  Memory  score is stable Continue Aricept at current dose will renew Given a copy of the MRI  of the brain done in 2013 Followup in 6 months Dennie Bible, [facility], [facility] [address], [place], [place] [address] [phone]

## 2013-06-22 NOTE — Patient Instructions (Signed)
Memory  score is stable Continue Aricept at current dose will renew Given a copy of the MRI  of the brain done in 2013 Followup in 6 months

## 2013-06-22 NOTE — Progress Notes (Signed)
I have read the note, and I agree with the clinical assessment and plan.  Charles K Willis   

## 2013-07-20 ENCOUNTER — Encounter: Payer: Self-pay | Admitting: Geriatric Medicine

## 2013-07-20 ENCOUNTER — Non-Acute Institutional Stay: Payer: Medicare Other | Admitting: Geriatric Medicine

## 2013-07-20 VITALS — BP 116/64 | HR 64

## 2013-07-20 DIAGNOSIS — R413 Other amnesia: Secondary | ICD-10-CM

## 2013-07-20 DIAGNOSIS — K219 Gastro-esophageal reflux disease without esophagitis: Secondary | ICD-10-CM

## 2013-07-20 DIAGNOSIS — R269 Unspecified abnormalities of gait and mobility: Secondary | ICD-10-CM

## 2013-07-20 DIAGNOSIS — G3184 Mild cognitive impairment, so stated: Secondary | ICD-10-CM

## 2013-07-20 DIAGNOSIS — M24549 Contracture, unspecified hand: Secondary | ICD-10-CM

## 2013-07-20 NOTE — Progress Notes (Signed)
Patient ID: Isabella George, female   DOB: 01-30-1933, 80 y.o.   MRN: [id number]  [facility] [phone])  Code Status: No advanced directives on file        Contact Information   Name Relation Home Work Mobile   Tonelli,Rick Son [phone]     Shavontae, Gibeault [phone]  [phone]      Chief Complaint  Patient presents with  . Medical Management of Chronic Issues    gait, dysphage, memory, fecal incontineence. Here with [employer] assistant to family    HPI: This 80 year old female resident of [facility], Independent Living section, returns to clinic today in followup of chronic issues.  Last visit:  Acid reflux Patient has not been taking omeprazole as prescribed, this is likely contributing to increased congestion and mucus especially while lying down. Have reinforced use of omeprazole as prescribed Sleep disturbance, unspecified This problem is unchanged, no new interventions at this time.  Osteoarthritis of both knees Patient is participating with physical therapy 2 days a week;  is walking some distance with assist of therapist and caregiver. Little cooperation with caregivers to exercise between therapy visits. Patient is not receptive to suggestions related to increased activity   Encounter for long-term (current) use of other medications Patient has not been taking vitamin supplement meds recently, son is concerned about the effect on her overall health. Will obtain lab studies to measure. Have recommended to patient restarting these medications for overall health.  Since last visit no acute medical issues, all laboratory studies done after last visit were satisfactory.  Patient's mobility has deteriorated, she is no longer ambulating Requires assistance for all transfers. Would like to resume PT. Patient's son Aaron Edelman expresses some concern about thick mucus in the throat at night. Patient sometimes coughs and sounds  "bad". He is concerned about development of pneumonia. Patient denies any trouble swallowing, denies coughing Patient self-reports short-term memory is "gone. She has limited social interactions, does have private caregivers 24 hours a day. Does  Go to [employer], chooses up to 4 books on tape each week.    Allergies  Allergen Reactions  . Adhesive [Tape]   . Bee Venom       Medication List       This list is accurate as of: 07/20/13  2:59 PM.  Always use your most recent med list.               aspirin 81 MG tablet  Take 81 mg by mouth daily.     CALCIUM + D PO  Take 1-2 tablets by mouth See admin instructions. Take 2 tablets in am and 1 tablet in pm     chlorpheniramine 4 MG tablet  Commonly known as:  CHLOR-TRIMETON  Take 4 mg by mouth at bedtime.     donepezil 5 MG tablet  Commonly known as:  ARICEPT  Take 1 tablet (5 mg total) by mouth daily.     EPINEPHrine 0.3 mg/0.3 mL Soaj injection  Commonly known as:  EPI-PEN  Inject 0.3 mLs (0.3 mg total) into the muscle once.     fish oil-omega-3 fatty acids 1000 MG capsule  Take 2 g by mouth daily.     MELATONIN PO  Take 2.5 mg by mouth.     MUCINEX PO  Take by mouth.     omeprazole 20 MG capsule  Commonly known as:  PRILOSEC  Take 40 mg by mouth daily.     triamcinolone  cream 0.1 %  Commonly known as:  KENALOG     vitamin C 500 MG tablet  Commonly known as:  ASCORBIC ACID  Take 500 mg by mouth daily.     VITAMIN-B COMPLEX PO  Take 1 capsule by mouth daily.       DATA REVIEWED  [facility] 07/26/2012 diagnostic sleep study impression: No sleep apnea/hypopnea syndrome noted during the study. Heavy snoring. Abnormal sleep architecture. Nocturnal myoclonus with clinical significance. Reduced sleep efficiency with increased frequency of awakenings. The arousal index was severely abnormal.  Radiologic studies 02/02/2013 modified barium swallow study:  Mild pharyngeal phase dysphagia    Clinical impression: Pt. exhibits a mild sensori-motor pharyngeal dysphagia, characterized by decreased tongue base contraction to the pharyngeal wall and laryngeal elevetion during the swallow. This results in silent penetration of thin liquids via cup to the cords, and trace silent aspiration of thin viz straws. There was also aspiration of thin liquid when pt. was attempting to swallow a pill. Again, this was silent aspiration, trace aspiration.   Lab [place], external 12/11/2011 CMP: Sodium 137, Potassium 4.1, glucose 90, BUN 26, Creatinine 0.74. Protein/LFTS WNL CBC: Wbc 6.2, Rbc 4.30, Hgb 13.3, Hct 38.4, Platelet 240 TSH 3.252  12/18/2011 Dr. Bernardo Heater labs TSH 2.400 Vitamin B12  1686   08/10/2012 WBC 5.9, hemoglobin 13.2, hematocrit 38.6, platelets 235  Glucose 81, BUN 18, creatinine 0.62, sodium 139, potassium 3.9. LFTs/proteins WNL.  Iron level 142  Lab Results  Component Value Date   WBC 6.4 03/29/2013   HGB 14.5 03/29/2013   HCT 42.6 03/29/2013   MCV 90 03/29/2013   PLT 156 07/03/2011   Lab Results  Component Value Date   NA 140 03/29/2013   K 4.3 03/29/2013   BUN 15 03/29/2013   CREATININE 0.68 03/29/2013    Lab Results  Component Value Date   ALT 14 03/29/2013   AST 15 03/29/2013   ALKPHOS 59 03/29/2013   BILITOT 0.5 03/29/2013   Lab Results  Component Value Date   TSH 2.810 03/29/2013   B12  1316  Vit D  53  REVIEW OF SYSTEMS DATA OBTAINED: from patient, caregiver GENERAL: Feels well. No fevers, fatigue. No change in appetite. Limited activity SKIN: No itch, rash or open wounds EYES: No eye pain, dryness or itching. No change in vision EARS: No earache, tinnitus, change in hearing NOSE: No congestion, drainage or bleeding MOUTH/THROAT: No mouth or tooth pain. No difficulty chewing or swallowing. Denies cough w/ eating RESPIRATORY: Continues w/ cough and congestion when lying down,  No wheezing, SOB CARDIAC: No chest pain, palpitations. No edema. GI: No abdominal pain.  No N/V/D or constipation. No heartburn or reflux.  GU: No dysuria, frequency or urgency.  No change in urine volume or character. Intermittent incontinence MUSCULOSKELETAL: . Non ambulatory, did not follow through w/ excercises rx by PT    No recent falls.  NEUROLOGIC: No dizziness, fainting, headache, No change in mental status.  PSYCHIATRIC: Negative attitiude, no anxiety, depression, behavior issue. Poor quality sleep  PHYSICAL EXAM  Filed Vitals:   07/20/13 1546  BP: 116/64  Pulse: 64  There is no weight on file to calculate BMI. unable to stand to get on the scale today  GENERAL APPEARANCE: No acute distress, appropriately groomed, Thin body habitus. Alert, pleasant, moderately conversant. HEAD: Normocephalic, atraumatic EYES: Conjunctiva/lids clear. Pupils round, reactive.  Keeps left eye closed  EARS:  Hearing grossly normal. NOSE: No deformity or discharge. MOUTH/THROAT: Lips w/o  lesions. Oral mucosa, tongue moist, w/o lesion. Oropharynx w/o redness or lesions.  NECK:  No thyroid tenderness, enlargement or nodule. Posterior neck and upper trapezius muscles very tight LYMPHATICS: No head, neck or supraclavicular adenopathy RESPIRATORY: Breathing is even, unlabored. Lung sounds are clear, shallow inspirations CARDIOVASCULAR: Heart RRR. No murmur or extra heart sounds   EDEMA: No peripheral edema.  GASTROINTESTINAL: Abdomen is soft, non-tender, not distended w/ normal bowel sounds.  MUSCULOSKELETAL: Stiff movement of bilateral shoulders and elbows she is able to extend both arms nearly to straight. Back with significant kyphosis . Left hand fourth and fifth fingers contracted. Gait not assessed today, is able to stand from wheelchair only with maximum assistance from caregiver and a gait belt. She is able to stand for a short time with assistance  NEUROLOGIC: Oriented to time, place, person. Speech clear, no tremor. PSYCHIATRIC: Affect mostly flat, attitude is generally negative, her  first response is 'no' to every suggestion, but exhibits some flexibility  ASSESSMENT/PLAN    Mild cognitive impairment with memory loss Patient self-reports short-term memory is gone, she is attempting to keep her mind active with reading. Have recommended some physical exercise as well. Patient appears as if she has little insight into how dependent she is on others for her care.  Abnormality of gait Long-standing gait instability complicated by hip fracture 2 years ago. Patient has very little motivation for daily exercise though he does participate well with physical therapy. She has had further decline in that she's not able to ambulate at all, will ask PT to return to help maximize her functional status. Her goal is to stay in her independent living home off the [facility]. This will only be possible if she continues to require only assist times one for transfers. If she requires to assistance and/or a mechanical lift, will need to move to skilled nursing facility.  Acid reflux It appears patient is taking omeprazole as prescribed. She denies any difficulties swallowing, denies reflux symptoms.  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 Caregiver has been attempting some stretching exercises with little result. Patient is scheduled for some Botox treatments at [facility]   Time: 45 minutes, >50% spent counseling/or care coordination  Follow up:  Return in about 3 months (around 10/20/2013) for Gait, memory, contractures left fingers.   Mardene Celeste, NP-C [place] [phone]  07/20/2013

## 2013-07-25 ENCOUNTER — Encounter: Payer: Self-pay | Admitting: Geriatric Medicine

## 2013-07-25 DIAGNOSIS — M24549 Contracture, unspecified hand: Secondary | ICD-10-CM

## 2013-07-25 HISTORY — DX: Contracture, unspecified hand: M24.549

## 2013-07-25 NOTE — Assessment & Plan Note (Addendum)
Patient self-reports short-term memory is gone, she is attempting to keep her mind active with reading. Have recommended some physical exercise as well. Patient appears as if she has little insight into how dependent she is on others for her care.

## 2013-07-25 NOTE — Assessment & Plan Note (Signed)
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 Caregiver has been attempting some stretching exercises with little result. Patient is scheduled for some Botox treatments at [facility]

## 2013-07-25 NOTE — Assessment & Plan Note (Addendum)
Long-standing gait instability complicated by hip fracture 2 years ago. Patient has very little motivation for daily exercise though he does participate well with physical therapy. She has had further decline in that she's not able to ambulate at all, will ask PT to return to help maximize her functional status. Her goal is to stay in her independent living home off the [facility]. This will only be possible if she continues to require only assist times one for transfers. If she requires to assistance and/or a mechanical lift, will need to move to skilled nursing facility.

## 2013-07-25 NOTE — Assessment & Plan Note (Addendum)
It appears patient is taking omeprazole as prescribed. She denies any difficulties swallowing, denies reflux symptoms.

## 2013-08-03 ENCOUNTER — Telehealth: Payer: Self-pay | Admitting: *Deleted

## 2013-08-03 NOTE — Telephone Encounter (Signed)
Pt informed time for Prolia. Due after 6/2, pt requested 6/15 at 230pm. Apt made. Pts insurance does cover Prolia. KW CMA

## 2013-08-22 ENCOUNTER — Ambulatory Visit: Payer: Medicare Other

## 2013-08-25 ENCOUNTER — Telehealth: Payer: Self-pay | Admitting: *Deleted

## 2013-08-25 ENCOUNTER — Ambulatory Visit (INDEPENDENT_AMBULATORY_CARE_PROVIDER_SITE_OTHER): Payer: Medicare Other | Admitting: Gynecology

## 2013-08-25 DIAGNOSIS — M810 Age-related osteoporosis without current pathological fracture: Secondary | ICD-10-CM

## 2013-08-25 MED ORDER — DENOSUMAB 60 MG/ML ~~LOC~~ SOLN
60.0000 mg | Freq: Once | SUBCUTANEOUS | Status: AC
  Administered 2013-08-25: 60 mg via SUBCUTANEOUS

## 2013-08-25 NOTE — Telephone Encounter (Signed)
(  TF patient) pt is due to come in today for Prolia injection @ 2:00 pm. Pt also scheduled for physical therapy today at 3:00pm which is very intense. She asked if okay to proceed with physical therapy?

## 2013-08-25 NOTE — Telephone Encounter (Signed)
She should go to physical therapy today and the prolia tomorrow

## 2013-08-25 NOTE — Telephone Encounter (Signed)
Pt informed okay.

## 2013-10-19 ENCOUNTER — Non-Acute Institutional Stay: Payer: Medicare Other | Admitting: Nurse Practitioner

## 2013-10-19 ENCOUNTER — Encounter: Payer: Self-pay | Admitting: Nurse Practitioner

## 2013-10-19 VITALS — BP 120/62 | HR 80 | Wt 167.8 lb

## 2013-10-19 DIAGNOSIS — I10 Essential (primary) hypertension: Secondary | ICD-10-CM

## 2013-10-19 DIAGNOSIS — R0989 Other specified symptoms and signs involving the circulatory and respiratory systems: Secondary | ICD-10-CM

## 2013-10-19 DIAGNOSIS — R413 Other amnesia: Secondary | ICD-10-CM

## 2013-10-19 DIAGNOSIS — K219 Gastro-esophageal reflux disease without esophagitis: Secondary | ICD-10-CM

## 2013-10-19 DIAGNOSIS — G479 Sleep disorder, unspecified: Secondary | ICD-10-CM

## 2013-10-19 DIAGNOSIS — G3184 Mild cognitive impairment, so stated: Secondary | ICD-10-CM

## 2013-10-19 DIAGNOSIS — R1319 Other dysphagia: Secondary | ICD-10-CM

## 2013-10-19 NOTE — Progress Notes (Signed)
Patient ID: Isabella George, female   DOB: Aug 14, 1932, 80 y.o.   MRN: [id number]    [facility] [phone]        Contact Information   Name Relation Home Work Wallace Son [phone]     Mckinzi, Eriksen [phone]  [phone]      Chief Complaint  Patient presents with  . Medical Management of Chronic Issues    memory, gait, contractures left finger  . chest congestion    still after eating, son wonders about stopping Mucinex. (too thick to move)    HPI: This 80 year old female resident of [facility], Independent Living section, returns to clinic today in followup of chronic issues. Pt with pmh of GERD, insomnia, OA, short term memory loss. Aide here with pt and reports lots of congestion which is chronic. Better with mucinex and omeprazole.  No shortness of breath but has cough, questions COPD. Has had swallow eval which showed Mild pharyngeal phase dysphagia, pt, family and caregiver following recommendations. Pt does not wish to have chest xray at this time. Reports she is doing well at home in her own house off [place] with help from family      Allergies  Allergen Reactions  . Adhesive [Tape]   . Bee Venom       Medication List       This list is accurate as of: 10/19/13  4:17 PM.  Always use your most recent med list.               ALIGN PO  Take by mouth. Take once daily     aspirin 81 MG tablet  Take 81 mg by mouth daily.     CALCIUM + D PO  Take 1-2 tablets by mouth See admin instructions. Take 2 tablets in am and 1 tablet in pm     chlorpheniramine 4 MG tablet  Commonly known as:  CHLOR-TRIMETON  Take 4 mg by mouth at bedtime.     donepezil 5 MG tablet  Commonly known as:  ARICEPT  Take 1 tablet (5 mg total) by mouth daily.     EPINEPHrine 0.3 mg/0.3 mL Soaj injection  Commonly known as:  EPI-PEN  Inject 0.3 mLs (0.3 mg total) into the muscle once.     fish oil-omega-3 fatty acids  1000 MG capsule  Take 2 g by mouth daily.     gentamicin 0.3 % ophthalmic solution  Commonly known as:  GARAMYCIN  One drop both eyes four times daily     MELATONIN PO  Take 2.5 mg by mouth.     MUCINEX PO  Take by mouth. Take 2 twice daily     omeprazole 20 MG capsule  Commonly known as:  PRILOSEC  Take 40 mg by mouth daily.     triamcinolone cream 0.1 %  Commonly known as:  KENALOG     vitamin C 500 MG tablet  Commonly known as:  ASCORBIC ACID  Take 500 mg by mouth daily.     VITAMIN-B COMPLEX PO  Take 1 capsule by mouth daily.       DATA REVIEWED  [facility] 07/26/2012 diagnostic sleep study impression: No sleep apnea/hypopnea syndrome noted during the study. Heavy snoring. Abnormal sleep architecture. Nocturnal myoclonus with clinical significance. Reduced sleep efficiency with increased frequency of awakenings. The arousal index was severely abnormal.  Radiologic studies 02/02/2013 modified barium swallow study:  Mild pharyngeal phase dysphagia  Clinical impression: Pt.  exhibits a mild sensori-motor pharyngeal dysphagia, characterized by decreased tongue base contraction to the pharyngeal wall and laryngeal elevetion during the swallow. This results in silent penetration of thin liquids via cup to the cords, and trace silent aspiration of thin viz straws. There was also aspiration of thin liquid when pt. was attempting to swallow a pill. Again, this was silent aspiration, trace aspiration.   Lab [place], external 12/11/2011 CMP: Sodium 137, Potassium 4.1, glucose 90, BUN 26, Creatinine 0.74. Protein/LFTS WNL CBC: Wbc 6.2, Rbc 4.30, Hgb 13.3, Hct 38.4, Platelet 240 TSH 3.252  12/18/2011 Dr. Bernardo Heater labs TSH 2.400 Vitamin B12  1686   08/10/2012 WBC 5.9, hemoglobin 13.2, hematocrit 38.6, platelets 235  Glucose 81, BUN 18, creatinine 0.62, sodium 139, potassium 3.9. LFTs/proteins WNL.  Iron level 142  Lab Results  Component Value Date   WBC 6.4  03/29/2013   HGB 14.5 03/29/2013   HCT 42.6 03/29/2013   MCV 90 03/29/2013   PLT 156 07/03/2011   Lab Results  Component Value Date   NA 140 03/29/2013   K 4.3 03/29/2013   BUN 15 03/29/2013   CREATININE 0.68 03/29/2013    Lab Results  Component Value Date   ALT 14 03/29/2013   AST 15 03/29/2013   ALKPHOS 59 03/29/2013   BILITOT 0.5 03/29/2013   Lab Results  Component Value Date   TSH 2.810 03/29/2013   B12  1316  Vit D  53  REVIEW OF SYSTEMS DATA OBTAINED: from patient, caregiver GENERAL: Feels well. No fevers, fatigue. No change in appetite. Limited activity SKIN: No itch, rash or open wounds EYES: No eye pain, dryness or itching. No change in vision EARS: No earache, tinnitus, change in hearing NOSE: No congestion, drainage or bleeding MOUTH/THROAT: No mouth or tooth pain. No difficulty chewing or swallowing. Denies cough w/ eating RESPIRATORY: Continues w/ cough and congestion which is unchanged,  No wheezing, SOB CARDIAC: No chest pain, palpitations. No edema. GI: No abdominal pain. No N/V/D or constipation. No heartburn or reflux.  GU: No dysuria, frequency or urgency.  No change in urine volume or character.  Incontinence of bladder MUSCULOSKELETAL: . Non ambulatory, pivots to bed and chair    No recent falls.  NEUROLOGIC: No dizziness, fainting, headache, No change in mental status.  PSYCHIATRIC: denies anxiety, depression, behavior issue.   PHYSICAL EXAM  Filed Vitals:   10/19/13 1555  BP: 120/62  Pulse: 80  Body mass index is 26.28 kg/(m^2).  GENERAL APPEARANCE: No acute distress, appropriately groomed, Thin body habitus. Alert, pleasant, moderately conversant. HEAD: Normocephalic, atraumatic EYES: Conjunctiva/lids clear. Pupils round, reactive.  Keeps left eye closed  EARS:  Hearing grossly normal. NOSE: No deformity or discharge. MOUTH/THROAT: Lips w/o lesions. Oral mucosa, tongue moist, w/o lesion. Oropharynx w/o redness or lesions.  NECK:  No thyroid tenderness,  enlargement or nodule. Chronic Posterior neck and upper trapezius muscles very tight  RESPIRATORY: Breathing is even, unlabored. Lung sounds are clear CARDIOVASCULAR: Heart RRR. No murmur or extra heart sounds   EDEMA: No peripheral edema.  GASTROINTESTINAL: Abdomen is soft, non-tender, not distended w/ normal bowel sounds.  MUSCULOSKELETAL: Stiff movement of bilateral shoulders and elbows she is able to extend both arms nearly to straight. Back with significant kyphosis . Left hand fourth and fifth fingers contracted. NEUROLOGIC: Oriented to time, place, person. Speech clear, no tremor. PSYCHIATRIC: Affect mostly flat  ASSESSMENT/PLAN     1. Unspecified essential hypertension Controlled at this time conts ASA -will follow up CBC  2. Gastroesophageal reflux disease without esophagitis Congestion improved some with omeprazole, will cont  3. Sleep disturbance, unspecified -conts on melatonin   4.  memory loss -conts aricept  5. Dysphagia Following recs by ST, no signs of aspiration- no coughing with meals lungs are clear  6. Congestion  -will cont mucinex, pt does not wish for further workup at this time  LABS- CBC and BMP ( pt and caregiver report they will go to the office for blood draws)

## 2013-10-25 DIAGNOSIS — R1314 Dysphagia, pharyngoesophageal phase: Secondary | ICD-10-CM | POA: Insufficient documentation

## 2013-12-06 ENCOUNTER — Telehealth: Payer: Self-pay | Admitting: *Deleted

## 2013-12-06 NOTE — Telephone Encounter (Signed)
Pts son calling to confirm appt but then mentioned that new PT who is working with his mother, notes that she may be showing clinical sx of PD.  Since possible new or ? If same diagnosis that has been seen before,  I made appt for Dr. Jannifer Franklin on Thursday at 1400 12-08-13 to assess.   Aaron Edelman, son to accompany.   Will call back to confirm.

## 2013-12-07 NOTE — Telephone Encounter (Signed)
I see appt cancelled per Dr Jannifer Franklin for 12-08-13.  Will see CMartin, on 12-23-13.

## 2013-12-08 ENCOUNTER — Ambulatory Visit: Payer: Self-pay | Admitting: Neurology

## 2013-12-14 ENCOUNTER — Encounter: Payer: Self-pay | Admitting: Nurse Practitioner

## 2013-12-14 ENCOUNTER — Non-Acute Institutional Stay: Payer: Medicare Other | Admitting: Nurse Practitioner

## 2013-12-14 VITALS — BP 122/60 | HR 72 | Temp 98.2°F

## 2013-12-14 DIAGNOSIS — M2560 Stiffness of unspecified joint, not elsewhere classified: Secondary | ICD-10-CM

## 2013-12-14 DIAGNOSIS — R1314 Dysphagia, pharyngoesophageal phase: Secondary | ICD-10-CM

## 2013-12-14 DIAGNOSIS — R002 Palpitations: Secondary | ICD-10-CM

## 2013-12-14 NOTE — Progress Notes (Signed)
Patient ID: Isabella George, female   DOB: 03-04-1933, 80 y.o.   MRN: [id number]    Nursing Home Location:  [place] of Service: Clinic (12)  PCP: Estill Dooms, MD  Allergies  Allergen Reactions  . Adhesive [Tape]   . Bee Venom     Chief Complaint  Patient presents with  . Referral    son wants patient referred to Dr. Wells Guiles Tat, because symptoms has gotten worse and Dr. Carles Collet is a movement specialist.   . Referral    for swallow-speech pathologist-trouble swallowing, had evaluation a year ago, this is follow-up  . Irregular Heart Beat    about one month ago, one time. Not today Patient here today with [facility] from [facility] and [facility] son's assistant. Son does medication, did not bring list of medications with them today.     HPI:  This 80 year old female resident of [facility] section, returns to clinic today to request neurology referral.  Pt with pmh of GERD, insomnia, OA, short term memory loss.  Pts OT reccommended pt to be seen by Dr Tat with [facility], she is a movement specialist and feels like she would benefit from her specialty. Has very stiff upper extremities, very limited ROM in shoulders and elbows Family reports pt is choking more with meals and coughing more with meals. Son would like Speech therapy to re-evaluate her. Last has evaluation a year ago. Currently on regular diet with thin liquids.  Pills are crushed in yogurts. Also drinks dannon yogurt drinks.  Vickie (sons Environmental consultant who is a Marine scientist) noted irregular heart beat one day, checked since and it has been regular. Pt reported she felt like her heart was getting away from her.   Review of Systems:  Review of Systems  Constitutional: Negative for activity change, appetite change, fatigue and unexpected weight change.       Limited activity   HENT: Negative for congestion and hearing loss.   Eyes:  Negative.   Respiratory: Positive for cough (with meals and congested). Negative for shortness of breath.   Cardiovascular: Positive for leg swelling (chronic). Negative for chest pain and palpitations.  Gastrointestinal: Negative for abdominal pain, diarrhea and constipation.  Genitourinary: Negative for dysuria and difficulty urinating.  Musculoskeletal: Positive for gait problem. Negative for arthralgias.       Nonambulatory, pivots to bed and chair.  Skin: Negative for color change and wound.  Neurological: Negative for dizziness and weakness.  Psychiatric/Behavioral: Positive for confusion. Negative for agitation.    Past Medical History  Diagnosis Date  . Osteoporosis, senile   . Cancer     Breast Cancer  . Acid reflux   . Hip fracture   . Depressive disorder, not elsewhere classified 2012  . Anemia, unspecified   . Acute posthemorrhagic anemia 06/2011  . Malignant neoplasm of breast (female), unspecified site 47    s/p left mastectomy  . Sleep disturbance, unspecified   . Abnormality of gait   . Allergic rhinitis due to pollen   . Unspecified essential hypertension   . Restless leg syndrome 08/04/2012  . Osteoarthritis of both knees   . Rhinitis, non-allergic   . Memory loss   . Osteoarthrosis, unspecified whether generalized or localized, unspecified site 12/17/2011  . Debility, unspecified 07/25/2011  . Aneurysm of unspecified site 07/08/2011  . Unspecified venous (peripheral) insufficiency 07/08/2011  . Closed fracture of intertrochanteric section of femur 07/07/2011  .  Dizziness and giddiness 06/23/2011  . Personal history of fall 06/23/2011  . Disturbance of skin sensation 05/26/2011  . Contusion of toe 05/26/2011  . Esophagitis, unspecified 04/14/2011  . Diaphragmatic hernia without mention of obstruction or gangrene 04/14/2011  . Loss of weight 04/14/2011  . Closed fracture of two ribs 04/14/2011  . Ulcer of esophagus 02/25/2011  . Chest pain, unspecified 02/10/2011  .  Unspecified hereditary and idiopathic peripheral neuropathy 02/05/2011  . Scoliosis (and kyphoscoliosis), idiopathic   . Unspecified urinary incontinence 12/27/2010  . Closed fracture of rib(s), unspecified 12/27/2010  . Contracture of finger joint 07/25/2013    Left 2,3,4 fingers   Past Surgical History  Procedure Laterality Date  . Appendectomy    . Tonsillectomy    . Anal fissuer    . Anal fissure repair    . Varicose vein surgery      Ligation of vein  . Tongue surgery    . Hand surgery    . Femur im nail  07/03/2011    Procedure: INTRAMEDULLARY (IM) NAIL FEMORAL;  Surgeon: Melina Schools, MD;  Location: [facility];  Service: Orthopedics;  Laterality: Left;  Synthes troch nail, jackson bed, c-arm  . Hip fracture surgery    . Breast surgery  1986    Left mastectomy  . Tongue surgery  2000    lump on back of tongue Dr. Owens Shark  . Cataract extraction w/ intraocular lens implant  2000    right  . Orif acetabular fracture  07/03/2011    left hip Dr. Rolena Infante  . Esophagogastroduodenoscopy endoscopy  [id number]    Dr. Cristina Gong with biopsy gastroesophageal junction mucosa with focal ulceration no intestinal metaplasia, dysphasia, or malignancy. Does have a hiatal hernia with esophagitis  . Colonoscopy w/ biopsies  02/21/2011    polyp-hyperplastic polyp, no adenomatous change or malignancy Dr. Cristina Gong   Social History:   reports that she has never smoked. She has never used smokeless tobacco. She reports that she does not drink alcohol or use illicit drugs.  Family History  Problem Relation Age of Onset  . Heart disease Mother   . Heart disease Brother   . Cancer Father     Liver cancer  . Diabetes Paternal Aunt   . COPD Brother     Medications: Patient's Medications  New Prescriptions   No medications on file  Previous Medications   ASPIRIN 81 MG TABLET    Take 81 mg by mouth daily.   B COMPLEX VITAMINS (VITAMIN-B COMPLEX PO)    Take 1 capsule by mouth daily.   CALCIUM  CARBONATE-VITAMIN D (CALCIUM + D PO)    Take 1-2 tablets by mouth See admin instructions. Take 2 tablets in am and 1 tablet in pm   CHLORPHENIRAMINE (CHLOR-TRIMETON) 4 MG TABLET    Take 4 mg by mouth at bedtime.   DONEPEZIL (ARICEPT) 5 MG TABLET    Take 1 tablet (5 mg total) by mouth daily.   EPINEPHRINE (EPI-PEN) 0.3 MG/0.3 ML SOAJ    Inject 0.3 mLs (0.3 mg total) into the muscle once.   FISH OIL-OMEGA-3 FATTY ACIDS 1000 MG CAPSULE    Take 2 g by mouth daily.     GENTAMICIN (GARAMYCIN) 0.3 % OPHTHALMIC SOLUTION    One drop both eyes four times daily   GUAIFENESIN (MUCINEX PO)    Take by mouth. Take 2 twice daily   MELATONIN PO    Take 2.5 mg by mouth.    OMEPRAZOLE (PRILOSEC) 20 MG CAPSULE  Take 40 mg by mouth daily.    PROBIOTIC PRODUCT (ALIGN PO)    Take by mouth. Take once daily   TRIAMCINOLONE CREAM (KENALOG) 0.1 %       VITAMIN C (ASCORBIC ACID) 500 MG TABLET    Take 500 mg by mouth daily.    Modified Medications   No medications on file  Discontinued Medications   No medications on file     Physical Exam: Filed Vitals:   12/14/13 1435  BP: 122/60  Pulse: 72  Temp: 98.2 F (36.8 C)  TempSrc: Oral  SpO2: 94%    Physical Exam  Constitutional: She is oriented to person, place, and time. She appears well-developed and well-nourished. No distress.  HENT:  Head: Normocephalic and atraumatic.  Mouth/Throat: Oropharynx is clear and moist. No oropharyngeal exudate.  Eyes: Conjunctivae are normal. Pupils are equal, round, and reactive to light.  Neck: Normal range of motion. Neck supple.  Cardiovascular: Normal rate, regular rhythm and normal heart sounds.   Pulmonary/Chest: Effort normal and breath sounds normal.  Abdominal: Soft. Bowel sounds are normal.  Musculoskeletal: She exhibits edema and tenderness.  Stiff movement of bilateral shoulders and elbows. Unable to fully extend both arms. Back with kyphosis.   Neurological: She is alert and oriented to person, place, and  time.  Skin: Skin is warm and dry. She is not diaphoretic.  Psychiatric: Her affect is blunt.    Labs reviewed: Basic Metabolic Panel:  Recent Labs  03/29/13 0849  NA 140  K 4.3  CL 103  CO2 22  GLUCOSE 96  BUN 15  CREATININE 0.68  CALCIUM 9.3   Liver Function Tests:  Recent Labs  03/29/13 0849  AST 15  ALT 14  ALKPHOS 59  BILITOT 0.5  PROT 6.4   No results found for this basename: LIPASE, AMYLASE,  in the last 8760 hours No results found for this basename: AMMONIA,  in the last 8760 hours CBC:  Recent Labs  03/29/13 0849  WBC 6.4  NEUTROABS 3.1  HGB 14.5  HCT 42.6  MCV 90   TSH:  Recent Labs  03/29/13 0849  TSH 2.810     Assessment/Plan 1. Palpitations -normal sinus rhythm today, rate regular, will update labs (never got lab work after last visit) - CBC With differential/Platelet; Future - TSH; Future - Comprehensive metabolic panel; Future  2. Dysphagia, pharyngoesophageal phase -will get speech therapy evaluation and recommendations - SLP modified barium swallow; Future  3. Stiffness in joint -will get referral to Dr Tat, neurology movement specialist at this time - Ambulatory referral to Neurology

## 2013-12-23 ENCOUNTER — Ambulatory Visit: Payer: Self-pay | Admitting: Nurse Practitioner

## 2013-12-28 ENCOUNTER — Ambulatory Visit: Payer: Medicare Other | Admitting: Neurology

## 2014-01-02 ENCOUNTER — Other Ambulatory Visit (HOSPITAL_COMMUNITY): Payer: Self-pay | Admitting: Nurse Practitioner

## 2014-01-02 DIAGNOSIS — R1310 Dysphagia, unspecified: Secondary | ICD-10-CM

## 2014-01-06 ENCOUNTER — Ambulatory Visit (HOSPITAL_COMMUNITY)
Admission: RE | Admit: 2014-01-06 | Discharge: 2014-01-06 | Disposition: A | Discharge status: Home / Self Care | Payer: Medicare Other | Source: Ambulatory Visit | Attending: Nurse Practitioner | Admitting: Nurse Practitioner

## 2014-01-06 ENCOUNTER — Other Ambulatory Visit (HOSPITAL_COMMUNITY): Payer: Medicare Other

## 2014-01-06 DIAGNOSIS — R1314 Dysphagia, pharyngoesophageal phase: Secondary | ICD-10-CM

## 2014-01-09 ENCOUNTER — Encounter: Payer: Self-pay | Admitting: Nurse Practitioner

## 2014-01-10 ENCOUNTER — Ambulatory Visit: Payer: Medicare Other | Admitting: Internal Medicine

## 2014-01-16 ENCOUNTER — Encounter: Payer: Self-pay | Admitting: Internal Medicine

## 2014-01-17 ENCOUNTER — Ambulatory Visit (INDEPENDENT_AMBULATORY_CARE_PROVIDER_SITE_OTHER): Payer: Medicare Other | Admitting: Neurology

## 2014-01-17 ENCOUNTER — Encounter: Payer: Self-pay | Admitting: Neurology

## 2014-01-17 VITALS — BP 122/64 | HR 71 | Temp 98.5°F | Resp 14

## 2014-01-17 DIAGNOSIS — N3942 Incontinence without sensory awareness: Secondary | ICD-10-CM

## 2014-01-17 DIAGNOSIS — R1319 Other dysphagia: Secondary | ICD-10-CM

## 2014-01-17 DIAGNOSIS — R531 Weakness: Secondary | ICD-10-CM

## 2014-01-17 DIAGNOSIS — G231 Progressive supranuclear ophthalmoplegia [Steele-Richardson-Olszewski]: Secondary | ICD-10-CM

## 2014-01-17 DIAGNOSIS — R441 Visual hallucinations: Secondary | ICD-10-CM

## 2014-01-17 DIAGNOSIS — R159 Full incontinence of feces: Secondary | ICD-10-CM

## 2014-01-17 DIAGNOSIS — F0280 Dementia in other diseases classified elsewhere without behavioral disturbance: Secondary | ICD-10-CM

## 2014-01-17 DIAGNOSIS — M6289 Other specified disorders of muscle: Secondary | ICD-10-CM

## 2014-01-17 DIAGNOSIS — F458 Other somatoform disorders: Secondary | ICD-10-CM

## 2014-01-17 NOTE — Progress Notes (Signed)
Isabella George was seen today in the movement disorders clinic for neurologic consultation at the request of [name], [name], MD.  The consultation is for the evaluation of gait changes.  Pt is accompanied by her son as well as a caregiver,  who supplements the history.  The patient has previously seen [name], and I have reviewed records all the way back to 1998.  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  Pt doesn't remember what exactly what happened in 2006 but her son thinks that the primary issue was being off balance and insomnia.  In 2008, she had a significant fall with a sternum fx according to her son.  She required a walker full time by 2012, when she moved to [facility].  She broke her hip in 2013 from a fall.  The walker seemed to only make falls worse.    She has had multiple sleep studies and those demonstrated PLM's.  She has had EDS and that has been one of her bigger c/o and that started in 2006.  It appears that bladder incontinence occurred early on as well but pt denies that and states that it was incorrect.  Her son does state that bladder incontinence may have started in 2008 but she has significant and complete bladder incontinence and over the last year, has had bowel incontinence as well.  Pt could intermittently walk with a platform walker and gait belt over the summer but now is basically WC bound.    Pt and son noted that there was a weakness in the L side/arm about a year ago.  She did fall and break her hand with multiple surgeries in 2009 but seemed to do well after that, but the thumb never completely recovered function.  There is no leg weakness.  The caregiver said that she does drool out the L side of the mouth with eating.  She went to [facility] earlier this year and saw Dr. [name] for Botox.  They do not know if they got  a diagnosis there but state that the botox did not help the clenched fist.  She only had it one time and was told if it didn't help then, no need to proceed further.  Specific Symptoms:  Tremor: No. Voice: hypophonic over the last few years; dysarthric over the last year Sleep:   Vivid Dreams:  No.  Acting out dreams:  Yes.   per caregiver Wet Pillows: No. Postural symptoms:  Yes.    Falls?  Yes.   Bradykinesia symptoms: slow movements, difficulty getting out of a chair and difficulty regaining balance Loss of smell:  No. Loss of taste:  No. Urinary Incontinence:  Yes.   Difficulty Swallowing:  No. per pt but yes per son/caregiver (had MBE few years ago and told to use nectar thick with pills) Handwriting, micrographia: Yes.   Trouble with ADL's:  Yes.   (caregivers do virtually everything)  Trouble buttoning clothing: Yes.   Depression:  Yes.   Memory changes:  Yes.   (feels like something crawling on her; feels like not in her home when she is) Hallucinations:  No. per pt but son reports some visual hallucinations of people  visual distortions: No. N/V:  No. Lightheaded:  No.  Syncope: No. Diplopia:  No. Dyskinesia:  No.  Neuroimaging has  previously been performed.  It is available for my review today.  MRI was performed on 01/06/2012 and it demonstrated atrophy, perhaps greater in the frontal and temporal regions than more posteriorally.  PREVIOUS MEDICATIONS:   The patient has been on multiple medications in the past, either for restless leg syndrome, the "gait disorder" or excessive daytime hypersomnolence.  Some of these medications include melatonin, clonazepam, Mirapex, levodopa, gabapentin, Requip (for restless leg responses and nuvigil (made the patient manic)  ALLERGIES:   Allergies  Allergen Reactions  . Adhesive [Tape]   . Bee Venom     CURRENT MEDICATIONS:  Outpatient Encounter Prescriptions as of 01/17/2014  Medication Sig  . aspirin 81 MG tablet Take 81 mg  by mouth daily.  . B Complex Vitamins (VITAMIN-B COMPLEX PO) Take 1 capsule by mouth daily.  . chlorpheniramine (CHLOR-TRIMETON) 4 MG tablet Take 4 mg by mouth at bedtime.  . cholecalciferol (VITAMIN D) 1000 UNITS tablet Take 1,000 Units by mouth daily.  Marland Kitchen donepezil (ARICEPT) 5 MG tablet Take 5 mg by mouth at bedtime.  . fish oil-omega-3 fatty acids 1000 MG capsule Take 2 g by mouth daily.    . GuaiFENesin (MUCINEX PO) Take by mouth. Take 2 twice daily  . Multiple Vitamins-Minerals (MULTIVITAMIN WITH MINERALS) tablet Take 1 tablet by mouth daily.  Marland Kitchen omeprazole (PRILOSEC) 20 MG capsule Take 40 mg by mouth daily.   . Probiotic Product (ALIGN PO) Take by mouth. Take once daily  . triamcinolone cream (KENALOG) 0.1 %   . vitamin B-12 (CYANOCOBALAMIN) 1000 MCG tablet Take 1,000 mcg by mouth daily.  . vitamin C (ASCORBIC ACID) 500 MG tablet Take 500 mg by mouth daily.    Marland Kitchen EPINEPHrine (EPI-PEN) 0.3 mg/0.3 mL SOAJ Inject 0.3 mLs (0.3 mg total) into the muscle once.  . [DISCONTINUED] Calcium Carbonate-Vitamin D (CALCIUM + D PO) Take 1-2 tablets by mouth See admin instructions. Take 2 tablets in am and 1 tablet in pm  . [DISCONTINUED] donepezil (ARICEPT) 5 MG tablet Take 1 tablet (5 mg total) by mouth daily.  . [DISCONTINUED] gentamicin (GARAMYCIN) 0.3 % ophthalmic solution One drop both eyes four times daily  . [DISCONTINUED] MELATONIN PO Take 2.5 mg by mouth.     PAST MEDICAL HISTORY:   Past Medical History  Diagnosis Date  . Osteoporosis, senile   . Cancer     Breast Cancer  . Acid reflux   . Hip fracture   . Depressive disorder, not elsewhere classified 2012  . Anemia, unspecified   . Acute posthemorrhagic anemia 06/2011  . Malignant neoplasm of breast (female), unspecified site 59    s/p left mastectomy  . Sleep disturbance, unspecified   . Abnormality of gait   . Allergic rhinitis due to pollen   . Unspecified essential hypertension   . Restless leg syndrome 08/04/2012  .  Osteoarthritis of both knees   . Rhinitis, non-allergic   . Memory loss   . Osteoarthrosis, unspecified whether generalized or localized, unspecified site 12/17/2011  . Debility, unspecified 07/25/2011  . Aneurysm of unspecified site 07/08/2011  . Unspecified venous (peripheral) insufficiency 07/08/2011  . Closed fracture of intertrochanteric section of femur 07/07/2011  . Dizziness and giddiness 06/23/2011  . Personal history of fall 06/23/2011  . Disturbance of skin sensation 05/26/2011  . Contusion of toe 05/26/2011  . Esophagitis, unspecified 04/14/2011  . Diaphragmatic hernia without mention of obstruction or gangrene 04/14/2011  . Loss of weight 04/14/2011  . Closed fracture of two ribs 04/14/2011  .  Ulcer of esophagus 02/25/2011  . Chest pain, unspecified 02/10/2011  . Unspecified hereditary and idiopathic peripheral neuropathy 02/05/2011  . Scoliosis (and kyphoscoliosis), idiopathic   . Unspecified urinary incontinence 12/27/2010  . Closed fracture of rib(s), unspecified 12/27/2010  . Contracture of finger joint 07/25/2013    Left 2,3,4 fingers    PAST SURGICAL HISTORY:   Past Surgical History  Procedure Laterality Date  . Appendectomy    . Tonsillectomy    . Anal fissuer    . Anal fissure repair    . Varicose vein surgery      Ligation of vein  . Tongue surgery    . Hand surgery    . Femur im nail  07/03/2011    Procedure: INTRAMEDULLARY (IM) NAIL FEMORAL;  Surgeon: Melina Schools, MD;  Location: [facility];  Service: Orthopedics;  Laterality: Left;  Synthes troch nail, jackson bed, c-arm  . Hip fracture surgery    . Breast surgery  1986    Left mastectomy  . Tongue surgery  2000    lump on back of tongue Dr. Owens Shark  . Cataract extraction w/ intraocular lens implant  2000    right  . Orif acetabular fracture  07/03/2011    left hip Dr. Rolena Infante  . Esophagogastroduodenoscopy endoscopy  [id number]    Dr. Cristina Gong with biopsy gastroesophageal junction mucosa with focal ulceration no intestinal  metaplasia, dysphasia, or malignancy. Does have a hiatal hernia with esophagitis  . Colonoscopy w/ biopsies  02/21/2011    polyp-hyperplastic polyp, no adenomatous change or malignancy Dr. Cristina Gong    SOCIAL HISTORY:   History   Social History  . Marital Status: Widowed    Spouse Name: N/A    Number of Children: N/A  . Years of Education: N/A   Occupational History  . Not on file.   Social History Main Topics  . Smoking status: Never Smoker   . Smokeless tobacco: Never Used  . Alcohol Use: No  . Drug Use: No  . Sexual Activity: No   Other Topics Concern  . Not on file   Social History Narrative   Lives at [place]   Patient is widowed.    Patient has 2 children.   Patient has 3 years or college.   Patient is retired.           FAMILY HISTORY:   Family Status  Relation Status Death Age  . Mother Deceased     CAD  . Brother Alive     CAD  . Son Alive   . Father Deceased     liver CA  . Brother Alive     COPD  . Son Alive     ROS:  A complete 10 system review of systems was obtained and was unremarkable apart from what is mentioned above.  PHYSICAL EXAMINATION:    VITALS:   Filed Vitals:   01/17/14 1245  BP: 122/64  Pulse: 71  Temp: 98.5 F (36.9 C)  TempSrc: Oral  Resp: 14  SpO2: 93%    GEN:  The patient appears stated age and is in NAD. HEENT:  Normocephalic, atraumatic.  The mucous membranes are moist. The superficial temporal arteries are without ropiness or tenderness. CV:  RRR Lungs:  CTAB Neck/HEME:  There are no carotid bruits bilaterally.  Neurological examination:  Orientation: The patient scored 18/30 on her MoCA today.  She had trouble with trail making, clock, questions of orientation.   Cranial nerves: There is L facial droop (lower facial).  Forehead wrinkle symmetric. Pupils are equal round and reactive to light bilaterally. Fundoscopic exam is attempted but the disc margins are not well visualized bilaterally.  When light is  shone in the eye, pt closes eyes reflexively. There is vertical gaze paresis.   The speech is bulbar in quality.  Has significant difficulty with the gutteral sounds.   Soft palate rises symmetrically and there is no tongue deviation. Hearing is intact to conversational tone. Sensation: Sensation is intact to light and pinprick throughout (facial, trunk, extremities). Vibration is intact at the bilateral big toe. There is no extinction with double simultaneous stimulation. There is no sensory dermatomal level identified. Motor: grip strength is good on the right.  She has decreased ability to abduct the right shoulder.  Otherwise, strength in the right arm and hand is 5/5.  She is able to flex the left arm and strength there is 4/5.  However, she has a clenched fist on the left and cannot move any of the fingers on the left.  She has marked decreased ability to abduct the left shoulder, with strength being 3-/5. Deep tendon reflexes: Deep tendon reflexes are 2/4 at the bilateral biceps, triceps, brachioradialis, trace at the bilateral patella and absent at the bilateral Achilles.  Plantar responses were both neutral.  Movement examination: Tone: There is marked increased tone in both upper extremities.  There was rigidity bilaterally, but there was also a degree of flexion contracture on the left upper extremity.  Tone in the lower extremities was normal. Abnormal movements: none Coordination:  There is decremation with RAM's, seen most prominently on the right, but she has decreased ability to move the leftperiod Gait and Station: The patient is unable to get out of the wheelchair, even with significant assistance.     ASSESSMENT/PLAN:  1.  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  This has been going on for quite some time.  Strongly suspect that this is now complicated by cerebral  infarction, given thefacial droop and left contractures.  For that reason, we will go ahead and do an updated MRI of the brain, as that seemed to happen about a year ago.  -She had Botox for the closed fist earlier this year at [facility].  It did not seem to help.  She only had 1 injection, but she did get a good dose.  The question is whether she had enough dose, or whether these are just purely fixed contractures at this point.  In either case, hopefully we can figure out some way to get this hand open, as she is at significant risk for skin breakdown and subsequent infection/sepsis.  -we may consider trying higher dose levodopa again, but I did express to the patient and her son that this far into the disease, we may not get much response to levodopa. 2.  Dysphagia.  -Will schedule a modified barium swallow.  She is having significant cough with meals and had dysphagia identified several years ago on modified barium swallow, according to the son.  3.  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  She has 24-hour per day care and this will just be something that we need to continue to monitor. 4.  Bladder and bowel incontinence.  -Was incontinent of bladder in the office today.  Again, she is at very high risk for sepsis/urosepsis.  Caregivers are doing a great job caring for her currently. 44.  Patient's son had a list of questions for me.  I answered them today to the best of my ability.  Greater than 50% of the visit was spent in counseling.  Total visit time was 90 minutes.

## 2014-01-17 NOTE — Patient Instructions (Signed)
1. We have scheduled you at [facility] for your MRI on 01/31/2014 at 5:00 pm. Please arrive 15 minutes prior and go to 1st floor radiology. If you need to reschedule for any reason please call [phone]. [facility] will call you directly to schedule your modified barium swallow. Once you receive a date please arrive 15 minutes prior and go to 1st floor radiology. If you need to contact them for any reason please call [phone].

## 2014-01-19 ENCOUNTER — Telehealth: Payer: Self-pay | Admitting: Neurology

## 2014-01-19 NOTE — Telephone Encounter (Signed)
Gaspar Bidding pt son would like to talk to someone about swallow function that is to be set up they want to know  the status if he is not there than you may talk to a caregiver [phone]

## 2014-01-20 ENCOUNTER — Other Ambulatory Visit (HOSPITAL_COMMUNITY): Payer: Self-pay | Admitting: Neurology

## 2014-01-20 DIAGNOSIS — R1314 Dysphagia, pharyngoesophageal phase: Secondary | ICD-10-CM

## 2014-01-20 NOTE — Telephone Encounter (Signed)
We have scheduled patient at [facility] for a modified barium swallow on 02/01/2014 at 1:00pm. Appt information given to Caregiver with number to r/s if not a good date/time.

## 2014-01-25 ENCOUNTER — Encounter: Payer: Self-pay | Admitting: Neurology

## 2014-01-26 ENCOUNTER — Telehealth: Payer: Self-pay | Admitting: *Deleted

## 2014-01-26 NOTE — Telephone Encounter (Signed)
After reviewing patient record, I called patient due for Prolia after12/18/15. Her insurance covers it and secondary picks up what primary doesn't cover. She will call back to schedule apt. KW CMA

## 2014-01-31 ENCOUNTER — Encounter: Payer: Self-pay | Admitting: Neurology

## 2014-01-31 ENCOUNTER — Ambulatory Visit (HOSPITAL_COMMUNITY)
Admission: RE | Admit: 2014-01-31 | Discharge: 2014-01-31 | Disposition: A | Discharge status: Home / Self Care | Payer: Medicare Other | Source: Ambulatory Visit | Attending: Neurology | Admitting: Neurology

## 2014-01-31 DIAGNOSIS — R531 Weakness: Secondary | ICD-10-CM | POA: Diagnosis present

## 2014-01-31 DIAGNOSIS — G319 Degenerative disease of nervous system, unspecified: Secondary | ICD-10-CM | POA: Diagnosis not present

## 2014-02-01 ENCOUNTER — Telehealth: Payer: Self-pay | Admitting: Neurology

## 2014-02-01 ENCOUNTER — Ambulatory Visit (HOSPITAL_COMMUNITY)
Admission: RE | Admit: 2014-02-01 | Discharge: 2014-02-01 | Disposition: A | Discharge status: Home / Self Care | Payer: Medicare Other | Source: Ambulatory Visit | Attending: Neurology | Admitting: Neurology

## 2014-02-01 DIAGNOSIS — K449 Diaphragmatic hernia without obstruction or gangrene: Secondary | ICD-10-CM | POA: Insufficient documentation

## 2014-02-01 DIAGNOSIS — I10 Essential (primary) hypertension: Secondary | ICD-10-CM | POA: Insufficient documentation

## 2014-02-01 DIAGNOSIS — R1311 Dysphagia, oral phase: Secondary | ICD-10-CM | POA: Insufficient documentation

## 2014-02-01 DIAGNOSIS — Z853 Personal history of malignant neoplasm of breast: Secondary | ICD-10-CM | POA: Diagnosis not present

## 2014-02-01 DIAGNOSIS — M810 Age-related osteoporosis without current pathological fracture: Secondary | ICD-10-CM | POA: Diagnosis not present

## 2014-02-01 DIAGNOSIS — R1319 Other dysphagia: Secondary | ICD-10-CM

## 2014-02-01 DIAGNOSIS — R1310 Dysphagia, unspecified: Secondary | ICD-10-CM | POA: Diagnosis present

## 2014-02-01 DIAGNOSIS — Z9012 Acquired absence of left breast and nipple: Secondary | ICD-10-CM | POA: Diagnosis not present

## 2014-02-01 DIAGNOSIS — R1313 Dysphagia, pharyngeal phase: Secondary | ICD-10-CM | POA: Diagnosis not present

## 2014-02-01 DIAGNOSIS — K219 Gastro-esophageal reflux disease without esophagitis: Secondary | ICD-10-CM | POA: Insufficient documentation

## 2014-02-01 DIAGNOSIS — R1314 Dysphagia, pharyngoesophageal phase: Secondary | ICD-10-CM

## 2014-02-01 NOTE — Telephone Encounter (Signed)
Patient's family made aware of results. They will call with any other questions.

## 2014-02-01 NOTE — Procedures (Signed)
Objective Swallowing Evaluation: Modified Barium Swallowing Study  Patient Details  Name: Isabella George MRN: [id number] Date of Birth: 12-28-1932  Today's Date: 02/01/2014 Time: 2505-3976 SLP Time Calculation (min) (ACUTE ONLY): 56 min  Past Medical History:  Past Medical History  Diagnosis Date  . Osteoporosis, senile   . Cancer     Breast Cancer  . Acid reflux   . Hip fracture   . Depressive disorder, not elsewhere classified 2012  . Anemia, unspecified   . Acute posthemorrhagic anemia 06/2011  . Malignant neoplasm of breast (female), unspecified site 73    s/p left mastectomy  . Sleep disturbance, unspecified   . Abnormality of gait   . Allergic rhinitis due to pollen   . Unspecified essential hypertension   . Restless leg syndrome 08/04/2012  . Osteoarthritis of both knees   . Rhinitis, non-allergic   . Memory loss   . Osteoarthrosis, unspecified whether generalized or localized, unspecified site 12/17/2011  . Debility, unspecified 07/25/2011  . Aneurysm of unspecified site 07/08/2011  . Unspecified venous (peripheral) insufficiency 07/08/2011  . Closed fracture of intertrochanteric section of femur 07/07/2011  . Dizziness and giddiness 06/23/2011  . Personal history of fall 06/23/2011  . Disturbance of skin sensation 05/26/2011  . Contusion of toe 05/26/2011  . Esophagitis, unspecified 04/14/2011  . Diaphragmatic hernia without mention of obstruction or gangrene 04/14/2011  . Loss of weight 04/14/2011  . Closed fracture of two ribs 04/14/2011  . Ulcer of esophagus 02/25/2011  . Chest pain, unspecified 02/10/2011  . Unspecified hereditary and idiopathic peripheral neuropathy 02/05/2011  . Scoliosis (and kyphoscoliosis), idiopathic   . Unspecified urinary incontinence 12/27/2010  . Closed fracture of rib(s), unspecified 12/27/2010  . Contracture of finger joint 07/25/2013    Left 2,3,4 fingers   Past Surgical History:  Past Surgical History  Procedure Laterality Date  .  Appendectomy    . Tonsillectomy    . Anal fissuer    . Anal fissure repair    . Varicose vein surgery      Ligation of vein  . Tongue surgery    . Hand surgery    . Femur im nail  07/03/2011    Procedure: INTRAMEDULLARY (IM) NAIL FEMORAL;  Surgeon: Melina Schools, MD;  Location: [facility];  Service: Orthopedics;  Laterality: Left;  Synthes troch nail, jackson bed, c-arm  . Hip fracture surgery    . Breast surgery  1986    Left mastectomy  . Tongue surgery  2000    lump on back of tongue Dr. Owens Shark  . Cataract extraction w/ intraocular lens implant  2000    right  . Orif acetabular fracture  07/03/2011    left hip Dr. Rolena Infante  . Esophagogastroduodenoscopy endoscopy  [id number]    Dr. Cristina Gong with biopsy gastroesophageal junction mucosa with focal ulceration no intestinal metaplasia, dysphasia, or malignancy. Does have a hiatal hernia with esophagitis  . Colonoscopy w/ biopsies  02/21/2011    polyp-hyperplastic polyp, no adenomatous change or malignancy Dr. Cristina Gong   HPI:  Pt is a 80 yo female referred for MBS due to concerns for worsening swallow ability.  Pt with PMH + for probable PSP, acid reflux, allergic rhinitis, memory loss, RLS, hip fx 2013.  Pt resides with her son and has 24/7 caregivers.  Son Dominica Severin and caregiver Andria Frames present during evaluation.  Son reports mother's swallow ability has worsened in the last year.  Pt consumes regular/thin diet - uses nectar liquids for pills.  Two significant  choking episodes reported by son including on large pill and on water.  Pt has maintained weight and has not had pulmonary infections.       Assessment / Plan / Recommendation Clinical Impression  Dysphagia Diagnosis: Moderate oral phase dysphagia;Moderate pharyngeal phase dysphagia, esophagus appeared clear upon sweep at completion of MBS  Clinical impression: Moderate oropharyngeal sensorimotor dysphagia without aspiration during testing.    Oral deficits characterized by weakness resulting in  slow mastication, delayed oral transiting and residuals.  Pt unable to orally transit barium tablet with pudding and SLP removed from anterior oral cavity.    Pharyngeal swallow was delayed with pooling of puree to pyriform sinus prior to swallow WITHOUT pt awareness and pt needed verbal cue to swallow.  Vallecular residuals noted with solids more than liquids due to decrease tongue base retraction and decreased oral propulsion again without pt awareness.  Cued dry swallows not effective to fully clear pharynx and pharyngeal retention appeared to worsen as testing progressed.  Pt did have coughing episode when consuming thin liquid (retention of pudding noted at vallecular space) with laryngeal penetration noted but no aspiration.  Laryngeal penetration cleared with cued throat clear and swallow.    Pt without aspiration of any consistency tested today but she is likely having episodic aspiration based on clinical information provided by family and MBS results.    Educated pt, son Dominica Severin and caregiver extensively to results of MBS and compensation strategies recommended and mitigate aspiration risk.  Son reports pt will demonstrate gurgly breathing quality and cough on secretions at night - advised him/pt/caregiver to importance of oral care to assure secretions are clear of severe bacteria.       Treatment Recommendation    Home Health SLP for dysphagia management   Diet Recommendation Dysphagia 3 (Mechanical Soft);Thin liquid (use nectar drinks during meals due to amount of pharyngeal residuals and increased aspiration risk- thin between meals)   Liquid Administration via: Cup;Straw (consider purchasing provale cup to decrease asp risk and encourage pt to self feed) Medication Administration: Crushed with puree Supervision: Full supervision/cueing for compensatory strategies;Staff to assist with self feeding Compensations: Slow rate;Small sips/bites;Hard cough after swallow;Multiple dry swallows  after each bite/sip (eat several meals/day) Postural Changes and/or Swallow Maneuvers: Seated upright 90 degrees;Upright 30-60 min after meal (pt to cough/expectorate to clear pharyngeal residuals at end of meal as able, rest if pt coughing during intake)    Other  Recommendations Recommended Consults: Other (Comment) Oral Care Recommendations: Oral care BID Other Recommendations: Order thickener from pharmacy   Follow Up Recommendations  Home health SLP (for dysphagia management)      General Date of Onset: 02/01/14 HPI: Pt is a 80 yo female referred for MBS due to concerns for worsening swallow ability.  Pt with PMH + for probable PSP, acid reflux, allergic rhinitis, memory loss, RLS, hip fx 2013.  Pt resides with her son and has 24/7 caregivers.  Son Dominica Severin and caregiver Andria Frames present during evaluation.  Son reports mother's swallow ability has worsened in the last year.  Pt consumes regular/thin diet - uses nectar liquids for pills.  Two significant choking episodes reported by son including on large pill and on water.  Pt has maintained weight and has not had pulmonary infections.   Type of Study: Modified Barium Swallowing Study Reason for Referral: Objectively evaluate swallowing function Previous Swallow Assessment: MBS + aspiration of thin - tracely recommended reg/nectar diet- no mixed consistencies Diet Prior to this Study: Regular;Thin liquids (nectar  with pills) Temperature Spikes Noted: No Respiratory Status: Room air History of Recent Intubation: No Behavior/Cognition: Alert;Cooperative;Pleasant mood Oral Cavity - Dentition: Adequate natural dentition Oral Motor / Sensory Function: Impaired motor;Impaired sensory Oral impairment: Left facial;Left labial;Left lingual Self-Feeding Abilities: Needs assist Patient Positioning: Postural control interferes with function (pt is kyphotic, son reports pt has scoliosis) Baseline Vocal Quality: Clear Volitional Cough: Weak Volitional  Swallow: Able to elicit Anatomy: Within functional limits Pharyngeal Secretions: Not observed secondary MBS    Reason for Referral Objectively evaluate swallowing function   Oral Phase Oral Preparation/Oral Phase Oral Phase: Impaired Oral - Nectar Oral - Nectar Teaspoon: Delayed oral transit;Reduced posterior propulsion;Weak lingual manipulation;Lingual/palatal residue Oral - Nectar Cup: Delayed oral transit;Reduced posterior propulsion;Weak lingual manipulation;Lingual/palatal residue Oral - Thin Oral - Thin Teaspoon: Delayed oral transit;Reduced posterior propulsion;Weak lingual manipulation;Lingual/palatal residue Oral - Thin Cup: Delayed oral transit;Reduced posterior propulsion;Weak lingual manipulation;Left anterior bolus loss;Lingual/palatal residue Oral - Thin Straw: Reduced posterior propulsion;Delayed oral transit;Weak lingual manipulation;Lingual/palatal residue Oral - Solids Oral - Puree: Reduced posterior propulsion;Delayed oral transit;Weak lingual manipulation;Lingual/palatal residue Oral - Regular: Reduced posterior propulsion;Delayed oral transit;Weak lingual manipulation;Lingual/palatal residue Oral - Pill: Reduced posterior propulsion;Delayed oral transit;Weak lingual manipulation (removed by SlP after pt did not transit into pharynx)   Pharyngeal Phase Pharyngeal Phase Pharyngeal Phase: Impaired Pharyngeal - Nectar Pharyngeal - Nectar Cup: Premature spillage to pyriform sinuses;Penetration/Aspiration during swallow;Reduced pharyngeal peristalsis;Reduced tongue base retraction Penetration/Aspiration details (nectar cup): Material enters airway, remains ABOVE vocal cords then ejected out Pharyngeal - Thin Pharyngeal - Thin Teaspoon: Premature spillage to pyriform sinuses;Reduced pharyngeal peristalsis;Reduced tongue base retraction Pharyngeal - Thin Cup: Premature spillage to pyriform sinuses;Pharyngeal residue - valleculae;Penetration/Aspiration during  swallow Penetration/Aspiration details (thin cup): Material enters airway, remains ABOVE vocal cords then ejected out Pharyngeal - Thin Straw: Premature spillage to pyriform sinuses;Reduced pharyngeal peristalsis;Reduced tongue base retraction Pharyngeal - Solids Pharyngeal - Puree: Premature spillage to valleculae;Pharyngeal residue - valleculae;Reduced pharyngeal peristalsis;Reduced tongue base retraction Pharyngeal - Regular: Premature spillage to valleculae;Pharyngeal residue - valleculae;Reduced pharyngeal peristalsis;Reduced tongue base retraction Pharyngeal Phase - Comment Pharyngeal Comment: pt without sensation to pharyngeal stasis, cued dry swallows decreased residuals   Cervical Esophageal Phase    GO    Cervical Esophageal Phase Cervical Esophageal Phase: [facility]    Functional Assessment Tool Used: mbs, clinical judgement Functional Limitations: Swallowing Swallow Current Status ([id number]): At least 40 percent but less than 60 percent impaired, limited or restricted Swallow Goal Status [phone]): At least 40 percent but less than 60 percent impaired, limited or restricted Swallow Discharge Status [phone]): At least 40 percent but less than 60 percent impaired, limited or restricted    Luanna Salk, [facility] SLP [phone]

## 2014-02-01 NOTE — Telephone Encounter (Signed)
-----   Message from Cameron Sprang, MD sent at 02/01/2014  9:38 AM EST ----- Pls let patient/family know that MRI brain showed age-related changes, no evidence of stroke, bleed, or tumor. Dr. Carles Collet will be back next week if they have further questions. Thanks

## 2014-02-16 ENCOUNTER — Telehealth: Payer: Self-pay | Admitting: Neurology

## 2014-02-16 NOTE — Telephone Encounter (Signed)
Left message on machine for patient to call back. To make him aware MBE results are usually discussed the the time of the test. Awaiting call back to see what he needs as far as plan of care.

## 2014-02-16 NOTE — Telephone Encounter (Signed)
Son -  Aaron Edelman called. Re: plan of care regarding swallowing issues. Please call [phone]

## 2014-02-17 ENCOUNTER — Other Ambulatory Visit: Payer: Self-pay | Admitting: *Deleted

## 2014-02-17 ENCOUNTER — Telehealth: Payer: Self-pay | Admitting: *Deleted

## 2014-02-17 DIAGNOSIS — R1310 Dysphagia, unspecified: Secondary | ICD-10-CM

## 2014-02-17 NOTE — Telephone Encounter (Signed)
Patient's son is requesting speech therapy for his mom.

## 2014-02-17 NOTE — Telephone Encounter (Signed)
Fine with me

## 2014-02-17 NOTE — Telephone Encounter (Signed)
Order sent.

## 2014-02-23 ENCOUNTER — Telehealth: Payer: Self-pay | Admitting: Neurology

## 2014-02-23 ENCOUNTER — Other Ambulatory Visit: Payer: Self-pay | Admitting: Nurse Practitioner

## 2014-02-23 MED ORDER — DONEPEZIL HCL 5 MG PO TABS: 5.0000 mg | ORAL_TABLET | Freq: Every day | ORAL | Status: DC

## 2014-02-23 NOTE — Telephone Encounter (Signed)
Pt son brian buckalew called and has some questions about medication [phone]

## 2014-02-23 NOTE — Telephone Encounter (Signed)
RX sent to pharmacy - Patient's son made aware.

## 2014-02-23 NOTE — Telephone Encounter (Signed)
No problem.

## 2014-02-23 NOTE — Telephone Encounter (Signed)
Patient transferred care from Isabella George. She needs refill of Aricept 5 mg. Okay to fill?

## 2014-02-28 ENCOUNTER — Telehealth: Payer: Self-pay | Admitting: Neurology

## 2014-02-28 ENCOUNTER — Telehealth: Payer: Self-pay | Admitting: Gynecology

## 2014-02-28 DIAGNOSIS — R1319 Other dysphagia: Secondary | ICD-10-CM

## 2014-02-28 DIAGNOSIS — G231 Progressive supranuclear ophthalmoplegia [Steele-Richardson-Olszewski]: Secondary | ICD-10-CM

## 2014-02-28 NOTE — Telephone Encounter (Signed)
Phone call to patient , talked with son Aaron Edelman, he would like to bring Baxter in on 03/07/14 at 2:00 for Prolia injection. After Medicare deductible has been met ($147.00) she will have a cost $0.

## 2014-02-28 NOTE — Telephone Encounter (Signed)
Since [facility] recommended home health for speech therapy due to MBE results, did we want to add PT? I called [facility] and patient actually lives at home, not at [place], so ST not available through them. Please advise.

## 2014-02-28 NOTE — Telephone Encounter (Signed)
Phone call talked with Valincia Touch son, regarding Prolia injection. Her son will bring her in for shot on 03/07/14 at 2 pm. Medicare deductible has been meet. Cost  $0 for patient after applicable deductible.

## 2014-02-28 NOTE — Telephone Encounter (Signed)
Referral to [facility] for [place] faxed to [phone] with confirmation received. They will contact patient.

## 2014-02-28 NOTE — Telephone Encounter (Signed)
sure

## 2014-03-01 ENCOUNTER — Telehealth: Payer: Self-pay | Admitting: Neurology

## 2014-03-01 NOTE — Telephone Encounter (Signed)
Abigail Butts from [employer] called and states that the patient son wants to start next week. She will need the orders to reflect that also she wants a copy of a test that was done please call her at [phone]

## 2014-03-06 ENCOUNTER — Telehealth: Payer: Self-pay | Admitting: *Deleted

## 2014-03-06 NOTE — Telephone Encounter (Signed)
Left  Message for Isabella George at [place] it is ok to hold ST and PT until the week of 03/06/14 per Dr Tat

## 2014-03-07 ENCOUNTER — Ambulatory Visit (INDEPENDENT_AMBULATORY_CARE_PROVIDER_SITE_OTHER): Payer: Medicare Other | Admitting: *Deleted

## 2014-03-07 DIAGNOSIS — M810 Age-related osteoporosis without current pathological fracture: Secondary | ICD-10-CM

## 2014-03-07 MED ORDER — DENOSUMAB 60 MG/ML ~~LOC~~ SOLN
60.0000 mg | Freq: Once | SUBCUTANEOUS | Status: AC
  Administered 2014-03-07: 60 mg via SUBCUTANEOUS

## 2014-03-08 ENCOUNTER — Telehealth: Payer: Self-pay | Admitting: *Deleted

## 2014-03-08 NOTE — Telephone Encounter (Signed)
Verified meds and history with Abigail Butts. She will call with any other questions.

## 2014-03-08 NOTE — Telephone Encounter (Signed)
Please call wendy with care [place]

## 2014-03-08 NOTE — Telephone Encounter (Signed)
Left message on machine for Isabella George to call back.

## 2014-03-13 LAB — BASIC METABOLIC PANEL
BUN: 18 mg/dL (ref 4–21)
CREATININE: 0.6 mg/dL (ref 0.5–1.1)
GLUCOSE: 109 mg/dL
Potassium: 3.8 mmol/L (ref 3.4–5.3)
SODIUM: 142 mmol/L (ref 137–147)

## 2014-03-13 LAB — CBC AND DIFFERENTIAL
HEMATOCRIT: 44 % (ref 36–46)
Hemoglobin: 14.6 g/dL (ref 12.0–16.0)
Platelets: 272 10*3/uL (ref 150–399)
WBC: 7.7 10^3/mL

## 2014-03-14 ENCOUNTER — Telehealth: Payer: Self-pay | Admitting: Neurology

## 2014-03-14 NOTE — Telephone Encounter (Signed)
Aaron Edelman, pt's son called wanting to see if Dr. Carles Collet could refer pt to a prvider that could see her for her lungs, maybe a pulmonologist  Please call Aaron Edelman  [phone]

## 2014-03-14 NOTE — Telephone Encounter (Signed)
For what?  She has PSP but not sure that pulm will see her for that?  Referral may need to come from PCP if has lung issue that I haven't documented

## 2014-03-14 NOTE — Telephone Encounter (Signed)
Do you have a pulmonary recommendation?

## 2014-03-15 NOTE — Telephone Encounter (Signed)
Left message on machine for patient to call back.

## 2014-03-16 NOTE — Telephone Encounter (Signed)
Spoke with son and made him aware this should come from PCP.

## 2014-03-21 ENCOUNTER — Ambulatory Visit (INDEPENDENT_AMBULATORY_CARE_PROVIDER_SITE_OTHER): Payer: Medicare Other | Admitting: Neurology

## 2014-03-21 ENCOUNTER — Encounter: Payer: Self-pay | Admitting: Neurology

## 2014-03-21 VITALS — BP 124/78 | HR 76

## 2014-03-21 DIAGNOSIS — G231 Progressive supranuclear ophthalmoplegia [Steele-Richardson-Olszewski]: Secondary | ICD-10-CM

## 2014-03-21 DIAGNOSIS — F458 Other somatoform disorders: Secondary | ICD-10-CM

## 2014-03-21 DIAGNOSIS — R1319 Other dysphagia: Secondary | ICD-10-CM

## 2014-03-21 NOTE — Progress Notes (Signed)
Isabella George was seen today in the movement disorders clinic for neurologic consultation at the request of [name], [name], MD.  The consultation is for the evaluation of gait changes.  Pt is accompanied by her son as well as a caregiver,  who supplements the history.  The patient has previously seen [name], and I have reviewed records all the way back to 1998.  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  Pt doesn't remember what exactly what happened in 2006 but her son thinks that the primary issue was being off balance and insomnia.  In 2008, she had a significant fall with a sternum fx according to her son.  She required a walker full time by 2012, when she moved to [facility].  She broke her hip in 2013 from a fall.  The walker seemed to only make falls worse.    She has had multiple sleep studies and those demonstrated PLM's.  She has had EDS and that has been one of her bigger c/o and that started in 2006.  It appears that bladder incontinence occurred early on as well but pt denies that and states that it was incorrect.  Her son does state that bladder incontinence may have started in 2008 but she has significant and complete bladder incontinence and over the last year, has had bowel incontinence as well.  Pt could intermittently walk with a platform walker and gait belt over the summer but now is basically WC bound.    Pt and son noted that there was a weakness in the L side/arm about a year ago.  She did fall and break her hand with multiple surgeries in 2009 but seemed to do well after that, but the thumb never completely recovered function.  There is no leg weakness.  The caregiver said that she does drool out the L side of the mouth with eating.  She went to [facility] earlier this year and saw Dr. [name] for Botox.  They do not know if they got  a diagnosis there but state that the botox did not help the clenched fist.  She only had it one time and was told if it didn't help then, no need to proceed further.  03/21/14 update:  Pt returns today for follow up.  She is accompanied by 2 caregivers and multiple questions from her son who is unable to attend the visit.  There are also questions from an occupational therapist that was written down.  Pt had a MBE on 02/01/14 and mechanical soft diet with thin liquids between meals were recommended but nectar thick liquids with the meals due to pharyngeal residuals.  They are following this.  Referral for ST/PT.  They are using an OT/yoga therapist.   Pts caregiver asks about using mucinex and was told to ask about a referral to pulmonology.    PREVIOUS MEDICATIONS:   The patient has been on multiple medications in the past, either for restless leg syndrome, the "gait disorder" or excessive daytime hypersomnolence.  Some of these medications include melatonin, clonazepam, Mirapex, levodopa, gabapentin, Requip (for restless leg responses and nuvigil (made the patient manic)  ALLERGIES:   Allergies  Allergen Reactions  . Adhesive [Tape]   . Bee Venom     CURRENT MEDICATIONS:  Outpatient Encounter Prescriptions as of 03/21/2014  Medication Sig  . aspirin 81 MG tablet Take 81 mg by mouth daily.  . B Complex Vitamins (VITAMIN-B COMPLEX PO) Take 1 capsule by mouth daily.  . chlorpheniramine (CHLOR-TRIMETON) 4 MG tablet Take 4 mg by mouth at bedtime.  . cholecalciferol (VITAMIN D) 1000 UNITS tablet Take 1,000 Units by mouth daily.  Marland Kitchen donepezil (ARICEPT) 5 MG tablet Take 1 tablet (5 mg total) by mouth at bedtime.  Marland Kitchen EPINEPHrine (EPI-PEN) 0.3 mg/0.3 mL SOAJ Inject 0.3 mLs (0.3 mg total) into the muscle once.  . fish oil-omega-3 fatty acids 1000 MG capsule Take 2 g by mouth daily.    . GuaiFENesin (MUCINEX PO) Take by mouth. Take 2 twice daily  . omeprazole (PRILOSEC) 20 MG capsule Take 40 mg by mouth  daily.   . Probiotic Product (ALIGN PO) Take by mouth. Take once daily  . triamcinolone cream (KENALOG) 0.1 %   . vitamin B-12 (CYANOCOBALAMIN) 1000 MCG tablet Take 1,000 mcg by mouth daily.  . vitamin C (ASCORBIC ACID) 500 MG tablet Take 500 mg by mouth daily.    . [DISCONTINUED] Multiple Vitamins-Minerals (MULTIVITAMIN WITH MINERALS) tablet Take 1 tablet by mouth daily.    PAST MEDICAL HISTORY:   Past Medical History  Diagnosis Date  . Osteoporosis, senile   . Cancer     Breast Cancer  . Acid reflux   . Hip fracture   . Depressive disorder, not elsewhere classified 2012  . Anemia, unspecified   . Acute posthemorrhagic anemia 06/2011  . Malignant neoplasm of breast (female), unspecified site 61    s/p left mastectomy  . Sleep disturbance, unspecified   . Abnormality of gait   . Allergic rhinitis due to pollen   . Unspecified essential hypertension   . Restless leg syndrome 08/04/2012  . Osteoarthritis of both knees   . Rhinitis, non-allergic   . Memory loss   . Osteoarthrosis, unspecified whether generalized or localized, unspecified site 12/17/2011  . Debility, unspecified 07/25/2011  . Aneurysm of unspecified site 07/08/2011  . Unspecified venous (peripheral) insufficiency 07/08/2011  . Closed fracture of intertrochanteric section of femur 07/07/2011  . Dizziness and giddiness 06/23/2011  . Personal history of fall 06/23/2011  . Disturbance of skin sensation 05/26/2011  . Contusion of toe 05/26/2011  . Esophagitis, unspecified 04/14/2011  . Diaphragmatic hernia without mention of obstruction or gangrene 04/14/2011  . Loss of weight 04/14/2011  . Closed fracture of two ribs 04/14/2011  . Ulcer of esophagus 02/25/2011  . Chest pain, unspecified 02/10/2011  . Unspecified hereditary and idiopathic peripheral neuropathy 02/05/2011  . Scoliosis (and kyphoscoliosis), idiopathic   . Unspecified urinary incontinence 12/27/2010  . Closed fracture of rib(s), unspecified 12/27/2010  .  Contracture of finger joint 07/25/2013    Left 2,3,4 fingers    PAST SURGICAL HISTORY:   Past Surgical History  Procedure Laterality Date  . Appendectomy    . Tonsillectomy    . Anal fissuer    . Anal fissure repair    . Varicose vein surgery      Ligation of vein  . Tongue surgery    . Hand surgery    . Femur im nail  07/03/2011    Procedure: INTRAMEDULLARY (IM) NAIL FEMORAL;  Surgeon: Melina Schools, MD;  Location: [facility];  Service: Orthopedics;  Laterality: Left;  Synthes troch nail, jackson bed, c-arm  . Hip fracture surgery    . Breast surgery  1986    Left mastectomy  . Tongue surgery  2000  lump on back of tongue Dr. Owens Shark  . Cataract extraction w/ intraocular lens implant  2000    right  . Orif acetabular fracture  07/03/2011    left hip Dr. Rolena Infante  . Esophagogastroduodenoscopy endoscopy  [id number]    Dr. Cristina Gong with biopsy gastroesophageal junction mucosa with focal ulceration no intestinal metaplasia, dysphasia, or malignancy. Does have a hiatal hernia with esophagitis  . Colonoscopy w/ biopsies  02/21/2011    polyp-hyperplastic polyp, no adenomatous change or malignancy Dr. Cristina Gong    SOCIAL HISTORY:   History   Social History  . Marital Status: Widowed    Spouse Name: N/A    Number of Children: N/A  . Years of Education: N/A   Occupational History  . Not on file.   Social History Main Topics  . Smoking status: Never Smoker   . Smokeless tobacco: Never Used  . Alcohol Use: No  . Drug Use: No  . Sexual Activity: No   Other Topics Concern  . Not on file   Social History Narrative   Lives at [place]   Patient is widowed.    Patient has 2 children.   Patient has 3 years or college.   Patient is retired.           FAMILY HISTORY:   Family Status  Relation Status Death Age  . Mother Deceased     CAD  . Brother Alive     CAD  . Son Alive   . Father Deceased     liver CA  . Brother Alive     COPD  . Son Alive     ROS:  A complete 10  system review of systems was obtained and was unremarkable apart from what is mentioned above.  PHYSICAL EXAMINATION:    VITALS:   Filed Vitals:   03/21/14 1426  BP: 124/78  Pulse: 76    GEN:  The patient appears stated age and is in NAD. HEENT:  Normocephalic, atraumatic.  The mucous membranes are moist. The superficial temporal arteries are without ropiness or tenderness. CV:  RRR Lungs:  CTAB Neck/HEME:  There are no carotid bruits bilaterally.  Neurological examination:  Orientation: The patient scored 18/30 on her MoCA on 01/17/14.  She is very sleepy today and does not participate with much of the historical hard of the examination.   Cranial nerves: There is L facial droop (lower facial).  Forehead wrinkle symmetric. Pupils are equal round and reactive to light bilaterally.  There is vertical gaze paresis.   The speech is bulbar in quality.  Has significant difficulty with the gutteral sounds.    Hearing is intact to conversational tone. Sensation: Sensation is intact to light and pinprick throughout (facial, trunk, extremities). Vibration is intact at the bilateral big toe. There is no extinction with double simultaneous stimulation. There is no sensory dermatomal level identified. Motor: grip strength is good on the right.  She has decreased ability to abduct the right shoulder.  Otherwise, strength in the right arm and hand is 5/5.  She is able to flex the left arm and strength there is 4/5.  However, she has a clenched fist on the left and cannot move any of the fingers on the left.  She has marked decreased ability to abduct the left shoulder, with strength being 3-/5. Deep tendon reflexes: Not tested today  Movement examination: Tone: There is marked increased tone in both upper extremities.  There was rigidity bilaterally, but there was also a  degree of flexion contracture on the left upper extremity.  Tone in the lower extremities was normal. Abnormal movements:  none Coordination:  There is decremation with RAM's, seen most prominently on the right, but she has decreased ability to move the left Gait and Station: The patient is unable to get out of the wheelchair  ASSESSMENT/PLAN:  1.  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  This has been going on for quite some time.  Strongly suspect that this is now complicated by cerebral infarction, given thefacial droop and left contractures.  MRI was negative for infarct but still suspect that is the case given hx that L side was acute.    -She had Botox for the closed fist earlier this year at [facility].  It did not seem to help.  She only had 1 injection, but she did get a good dose.   Probably has fixed contractures in that hand  -Stand most of the visit talking to them about whether her levodopa has any value now.  Personally, I told them that I probably would not start it.  She is no longer ambulatory.  I am not sure what the goals would be.  I told them that it would be an option, but again I'm not sure what it would help.  It could help the rigidity, but I think that it is likely that risks could outweigh benefits this far along.  In addition, it is doubtful to be of benefit this far along.  They're going to talk this over with her son and are going to call me back with any questions. 2.  Dysphagia.  -She had a modified barium swallow on 02/01/2014 and mechanical soft with thin liquids between meals and nectar thick during meals was recommended.  She is intermittently following this diet.  Talked about her aspiration risks with her and her caregivers.  -Her occupational therapist questioned some of the recommendations of the speech therapist.  Stated that the speech therapist said that she should not be talking.  This is an odd recommendation.  I did not get this recommendation from her speech  therapist.  We will try to contact them.  -As above, they asked about a referral to pulmonology.  I really do not see any primary pulmonary issue.  She has a primary neurologic issue.  Really do not think that there is anything that pulm can do for her at this point unless there is an issue going on that they didn't express to me.   3.  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  She has 24-hour per day care.  She is on Aricept. 4.  Bladder and bowel incontinence.  -she is at very high risk for sepsis/urosepsis.  Caregivers are doing a great job caring for her currently. 5.  Filled out her handicap placard today. 6.  F/u prn.  Difficult to get her out of the home to the office.  Happy to see as needed.

## 2014-03-22 ENCOUNTER — Encounter: Payer: Self-pay | Admitting: Nurse Practitioner

## 2014-03-22 ENCOUNTER — Non-Acute Institutional Stay: Payer: Medicare Other | Admitting: Nurse Practitioner

## 2014-03-22 ENCOUNTER — Telehealth: Payer: Self-pay | Admitting: Neurology

## 2014-03-22 VITALS — BP 138/64 | HR 64 | Temp 96.6°F

## 2014-03-22 DIAGNOSIS — G231 Progressive supranuclear ophthalmoplegia [Steele-Richardson-Olszewski]: Secondary | ICD-10-CM

## 2014-03-22 DIAGNOSIS — R21 Rash and other nonspecific skin eruption: Secondary | ICD-10-CM

## 2014-03-22 DIAGNOSIS — Z13220 Encounter for screening for lipoid disorders: Secondary | ICD-10-CM

## 2014-03-22 DIAGNOSIS — R1314 Dysphagia, pharyngoesophageal phase: Secondary | ICD-10-CM

## 2014-03-22 DIAGNOSIS — R6889 Other general symptoms and signs: Secondary | ICD-10-CM

## 2014-03-22 MED ORDER — MUPIROCIN 2 % EX OINT
1.0000 | TOPICAL_OINTMENT | Freq: Two times a day (BID) | CUTANEOUS | Status: DC
Start: 2014-03-22 — End: 2015-03-16

## 2014-03-22 NOTE — Telephone Encounter (Signed)
Spoke with Abigail Butts and verified that she did not tell patient not to use her voice. The only similar thing she may have said is to try not to talk while eating because this could increase chance of aspiration. She states patient not wanting to follow recommended diet and understands the risk of aspiration, but her son is forcing it. She will call if needed.

## 2014-03-22 NOTE — Telephone Encounter (Signed)
Patients son made aware.

## 2014-03-22 NOTE — Progress Notes (Signed)
Patient ID: Isabella George, female   DOB: Jan 08, 1933, 81 y.o.   MRN: [id number]    Nursing Home Location:  [facility] of Service: Clinic (12)  PCP: Estill Dooms, MD  Allergies  Allergen Reactions  . Adhesive [Tape]   . Bee Venom     Chief Complaint  Patient presents with  . Medical Management of Chronic Issues    Wants referral to Pulmonary doctor for chest congestion for about one year.  Here with son Aaron Edelman and Olegario Shearer (works with son)    HPI:  This 81 year old female resident of [facility] Living section, returns to clinic today to request pulmonary referral due to ongoing increased congestion.  Pt with pmh of GERD, insomnia, OA, short term memory loss. Caregiver reports a lot of sputum and cough. No shortness of breath Neurologist she saw yesterday stated these symptoms go along with her neurological progression of progressive supranuclear palsy. Pt has been following along with ST as well and they have finished their visits. Pt currently on mech soft diet with thicken liquids with meals. Caregiver reports pt has chosen to follow recommendations loosely. Still wants to chew therefore she does not follow the recommendations strictly.  Caregiver reports without mucinex she has copious amounts of secretions.    Would like to see pulmonary to see if they can offer her anything else.   Also with abrasions on her legs that are slow to heal.   Review of Systems:  Review of Systems  Constitutional: Negative for activity change, appetite change, fatigue and unexpected weight change.       Limited activity   HENT: Negative for congestion and hearing loss.   Eyes: Negative.   Respiratory: Positive for cough (with meals and congested). Negative for shortness of breath.   Cardiovascular: Positive for leg swelling (chronic). Negative for chest pain and palpitations.  Gastrointestinal: Negative for abdominal pain, diarrhea and  constipation.  Genitourinary: Negative for dysuria and difficulty urinating.  Musculoskeletal: Positive for gait problem. Negative for arthralgias.       Nonambulatory, pivots to bed and chair.  Skin: Negative for color change and wound.  Neurological: Negative for dizziness and weakness.  Psychiatric/Behavioral: Positive for confusion. Negative for agitation.    Past Medical History  Diagnosis Date  . Osteoporosis, senile   . Cancer     Breast Cancer  . Acid reflux   . Hip fracture   . Depressive disorder, not elsewhere classified 2012  . Anemia, unspecified   . Acute posthemorrhagic anemia 06/2011  . Malignant neoplasm of breast (female), unspecified site 12    s/p left mastectomy  . Sleep disturbance, unspecified   . Abnormality of gait   . Allergic rhinitis due to pollen   . Unspecified essential hypertension   . Restless leg syndrome 08/04/2012  . Osteoarthritis of both knees   . Rhinitis, non-allergic   . Memory loss   . Osteoarthrosis, unspecified whether generalized or localized, unspecified site 12/17/2011  . Debility, unspecified 07/25/2011  . Aneurysm of unspecified site 07/08/2011  . Unspecified venous (peripheral) insufficiency 07/08/2011  . Closed fracture of intertrochanteric section of femur 07/07/2011  . Dizziness and giddiness 06/23/2011  . Personal history of fall 06/23/2011  . Disturbance of skin sensation 05/26/2011  . Contusion of toe 05/26/2011  . Esophagitis, unspecified 04/14/2011  . Diaphragmatic hernia without mention of obstruction or gangrene 04/14/2011  . Loss of weight 04/14/2011  . Closed fracture of two ribs 04/14/2011  .  Ulcer of esophagus 02/25/2011  . Chest pain, unspecified 02/10/2011  . Unspecified hereditary and idiopathic peripheral neuropathy 02/05/2011  . Scoliosis (and kyphoscoliosis), idiopathic   . Unspecified urinary incontinence 12/27/2010  . Closed fracture of rib(s), unspecified 12/27/2010  . Contracture of finger joint 07/25/2013    Left  2,3,4 fingers   Past Surgical History  Procedure Laterality Date  . Appendectomy    . Tonsillectomy    . Anal fissuer    . Anal fissure repair    . Varicose vein surgery      Ligation of vein  . Tongue surgery    . Hand surgery    . Femur im nail  07/03/2011    Procedure: INTRAMEDULLARY (IM) NAIL FEMORAL;  Surgeon: Melina Schools, MD;  Location: [facility];  Service: Orthopedics;  Laterality: Left;  Synthes troch nail, jackson bed, c-arm  . Hip fracture surgery    . Breast surgery  1986    Left mastectomy  . Tongue surgery  2000    lump on back of tongue Dr. Owens Shark  . Cataract extraction w/ intraocular lens implant  2000    right  . Orif acetabular fracture  07/03/2011    left hip Dr. Rolena Infante  . Esophagogastroduodenoscopy endoscopy  [id number]    Dr. Cristina Gong with biopsy gastroesophageal junction mucosa with focal ulceration no intestinal metaplasia, dysphasia, or malignancy. Does have a hiatal hernia with esophagitis  . Colonoscopy w/ biopsies  02/21/2011    polyp-hyperplastic polyp, no adenomatous change or malignancy Dr. Cristina Gong   Social History:   reports that she has never smoked. She has never used smokeless tobacco. She reports that she does not drink alcohol or use illicit drugs.  Family History  Problem Relation Age of Onset  . Heart disease Mother   . Heart disease Brother   . Cancer Father     Liver cancer  . Diabetes Paternal Aunt   . COPD Brother     Medications: Patient's Medications  New Prescriptions   No medications on file  Previous Medications   ASPIRIN 81 MG TABLET    Take 81 mg by mouth daily.   B COMPLEX VITAMINS (VITAMIN-B COMPLEX PO)    Take 1 capsule by mouth daily.   CHOLECALCIFEROL (VITAMIN D) 1000 UNITS TABLET    Take 1,000 Units by mouth daily.   DONEPEZIL (ARICEPT) 5 MG TABLET    Take 1 tablet (5 mg total) by mouth at bedtime.   EPINEPHRINE (EPI-PEN) 0.3 MG/0.3 ML SOAJ    Inject 0.3 mLs (0.3 mg total) into the muscle once.   FISH OIL-KRILL OIL  (OMEGA-3 DUAL SPECTRUM PO)    Take by mouth. 750mg  two teaspoons daily   GUAIFENESIN (MUCINEX PO)    Take by mouth. Take one twice daily   OMEPRAZOLE (PRILOSEC) 20 MG CAPSULE    Take 40 mg by mouth daily.    PROBIOTIC PRODUCT (ALIGN PO)    Take by mouth. Take once daily   TRIAMCINOLONE CREAM (KENALOG) 0.1 %       VITAMIN B-12 (CYANOCOBALAMIN) 1000 MCG TABLET    Take 1,000 mcg by mouth daily.   VITAMIN C (ASCORBIC ACID) 500 MG TABLET    Take 300 mg by mouth daily.   Modified Medications   No medications on file  Discontinued Medications   CHLORPHENIRAMINE (CHLOR-TRIMETON) 4 MG TABLET    Take 4 mg by mouth at bedtime.   FISH OIL-OMEGA-3 FATTY ACIDS 1000 MG CAPSULE    Take 2 g by  mouth daily.       Physical Exam: Filed Vitals:   03/22/14 1418  BP: 138/64  Pulse: 64  Temp: 96.6 F (35.9 C)  TempSrc: Oral  SpO2: 99%    Physical Exam  Constitutional: She is oriented to person, place, and time. She appears well-developed and well-nourished. No distress.  HENT:  Head: Normocephalic and atraumatic.  Mouth/Throat: Oropharynx is clear and moist. No oropharyngeal exudate.  Eyes: Conjunctivae are normal. Pupils are equal, round, and reactive to light.  Neck: Normal range of motion. Neck supple.  Cardiovascular: Normal rate, regular rhythm and normal heart sounds.   Pulmonary/Chest: Effort normal and breath sounds normal.  Abdominal: Soft. Bowel sounds are normal.  Musculoskeletal: She exhibits edema and tenderness.  Stiff movement of bilateral shoulders and elbows. Unable to fully extend both arms. Back with kyphosis. Left hand with contracture  Neurological: She is alert and oriented to person, place, and time.  Skin: Skin is warm and dry. She is not diaphoretic.  Psychiatric: Her affect is blunt.    Labs reviewed: Basic Metabolic Panel:  Recent Labs  03/29/13 0849  NA 140  K 4.3  CL 103  CO2 22  GLUCOSE 96  BUN 15  CREATININE 0.68  CALCIUM 9.3   Liver Function  Tests:  Recent Labs  03/29/13 0849  AST 15  ALT 14  ALKPHOS 59  BILITOT 0.5  PROT 6.4   No results for input(s): LIPASE, AMYLASE in the last 8760 hours. No results for input(s): AMMONIA in the last 8760 hours. CBC:  Recent Labs  03/29/13 0849  WBC 6.4  NEUTROABS 3.1  HGB 14.5  HCT 42.6  MCV 90   TSH:  Recent Labs  03/29/13 0849  TSH 2.810     Assessment/Plan  1. Skin eruption -has long acrylic nails that do not get manicured frequently, neurologist mentioned to family with her contractures this puts her at risk for skin breakdown in her palm on the affected hand. Pt is also scratching with right had and causing abrasions. Discussed cutting nails short and stopping the acrylic nails.   - mupirocin ointment (BACTROBAN) 2 %; Apply 1 application topically 2 (two) times daily. APPLY TO AFFECTED AREAS AS NEEDED  Dispense: 22 g; Refill: 0  2. Congestion of throat -neurology felt this was related to PSP, pt and family really would like this evaluated by pulmonary doctor for another opinion  -to cont mucinex  - Ambulatory referral to Pulmonology  3. Screening cholesterol level - Lipid panel; Future  4. Dysphagia, pharyngoesophageal phase -cont speech therapy recommendations  5. progressive supranuclear palsy -saw Dr Tat,  Follow up PRN    Follow up in 6 months, sooner if needed

## 2014-03-22 NOTE — Telephone Encounter (Signed)
Great.  I didn't think that made sense.  You can let caregiver or son know as they wanted feedback

## 2014-03-22 NOTE — Telephone Encounter (Signed)
Left message on machine for [place], [place] with [facility], to call back.

## 2014-03-28 ENCOUNTER — Other Ambulatory Visit: Payer: Medicare Other

## 2014-03-28 DIAGNOSIS — G3184 Mild cognitive impairment, so stated: Secondary | ICD-10-CM

## 2014-03-28 DIAGNOSIS — R002 Palpitations: Secondary | ICD-10-CM

## 2014-03-28 DIAGNOSIS — Z13220 Encounter for screening for lipoid disorders: Secondary | ICD-10-CM

## 2014-03-29 LAB — CBC WITH DIFFERENTIAL
Basophils Absolute: 0 10*3/uL (ref 0.0–0.2)
Basos: 1 %
EOS ABS: 0.2 10*3/uL (ref 0.0–0.4)
Eos: 3 %
HEMATOCRIT: 41 % (ref 34.0–46.6)
Hemoglobin: 14.4 g/dL (ref 11.1–15.9)
IMMATURE GRANS (ABS): 0 10*3/uL (ref 0.0–0.1)
Immature Granulocytes: 0 %
Lymphocytes Absolute: 2 10*3/uL (ref 0.7–3.1)
Lymphs: 35 %
MCH: 31.3 pg (ref 26.6–33.0)
MCHC: 35.1 g/dL (ref 31.5–35.7)
MCV: 89 fL (ref 79–97)
MONOS ABS: 0.8 10*3/uL (ref 0.1–0.9)
Monocytes: 15 %
NEUTROS ABS: 2.6 10*3/uL (ref 1.4–7.0)
Neutrophils Relative %: 46 %
Platelets: 220 10*3/uL (ref 150–379)
RBC: 4.6 x10E6/uL (ref 3.77–5.28)
RDW: 13.2 % (ref 12.3–15.4)
WBC: 5.6 10*3/uL (ref 3.4–10.8)

## 2014-03-29 LAB — COMPREHENSIVE METABOLIC PANEL
ALBUMIN: 3.9 g/dL (ref 3.5–4.8)
ALT: 23 IU/L (ref 0–32)
AST: 18 IU/L (ref 0–40)
Albumin/Globulin Ratio: 1.3 (ref 1.1–2.5)
Alkaline Phosphatase: 66 IU/L (ref 39–117)
BILIRUBIN TOTAL: 0.4 mg/dL (ref 0.0–1.2)
BUN/Creatinine Ratio: 27 — ABNORMAL HIGH (ref 11–26)
BUN: 16 mg/dL (ref 8–27)
CO2: 22 mmol/L (ref 18–29)
Calcium: 8.7 mg/dL (ref 8.7–10.3)
Chloride: 104 mmol/L (ref 97–108)
Creatinine, Ser: 0.6 mg/dL (ref 0.57–1.00)
GFR calc non Af Amer: 87 mL/min/{1.73_m2} (ref >59)
GFR, EST AFRICAN AMERICAN: 100 mL/min/{1.73_m2} (ref >59)
GLUCOSE: 89 mg/dL (ref 65–99)
Globulin, Total: 2.9 g/dL (ref 1.5–4.5)
POTASSIUM: 4.3 mmol/L (ref 3.5–5.2)
Sodium: 142 mmol/L (ref 134–144)
TOTAL PROTEIN: 6.8 g/dL (ref 6.0–8.5)

## 2014-03-29 LAB — TSH: TSH: 2.18 u[IU]/mL (ref 0.450–4.500)

## 2014-03-29 LAB — LIPID PANEL
CHOL/HDL RATIO: 5.6 ratio — AB (ref 0.0–4.4)
Cholesterol, Total: 273 mg/dL — ABNORMAL HIGH (ref 100–199)
HDL: 49 mg/dL (ref >39)
LDL Calculated: 204 mg/dL — ABNORMAL HIGH (ref 0–99)
Triglycerides: 102 mg/dL (ref 0–149)
VLDL CHOLESTEROL CAL: 20 mg/dL (ref 5–40)

## 2014-03-31 LAB — METHYLMALONIC ACID, SERUM: Methylmalonic Acid: 277 nmol/L (ref 0–378)

## 2014-04-04 ENCOUNTER — Ambulatory Visit (INDEPENDENT_AMBULATORY_CARE_PROVIDER_SITE_OTHER): Payer: Medicare Other | Admitting: Internal Medicine

## 2014-04-04 ENCOUNTER — Encounter: Payer: Self-pay | Admitting: Internal Medicine

## 2014-04-04 VITALS — BP 130/78 | HR 70

## 2014-04-04 DIAGNOSIS — R05 Cough: Secondary | ICD-10-CM

## 2014-04-04 DIAGNOSIS — R059 Cough, unspecified: Secondary | ICD-10-CM

## 2014-04-04 NOTE — Patient Instructions (Addendum)
Add pepcid (famotidine)  20ac mg at bedtime along with chlotrimeton(chlorpheniramine)  4 mg x 2 at bedtime and continue prilosec Take 30-60 min before first meal of the day to see whow things go especially with bedtime cough  Keep Head of bed up as high as needed   GERD (REFLUX)  is an extremely common cause of respiratory symptoms just like yours , many times with no obvious heartburn at all.    It can be treated with medication, but also with lifestyle changes including avoidance of late meals, excessive alcohol, smoking cessation, and avoid fatty foods, chocolate, peppermint, colas, red wine, and acidic juices such as orange juice.  NO MINT OR MENTHOL PRODUCTS SO NO COUGH DROPS  USE SUGARLESS CANDY INSTEAD (Jolley ranchers or Stover's or Life Savers) or even ice chips will also do - the key is to swallow to prevent all throat clearing. NO OIL BASED VITAMINS - use powdered substitutes.    I will recommend a chest xray be done at [facility]

## 2014-04-04 NOTE — Progress Notes (Signed)
Subjective:     Patient ID: Isabella George, female   DOB: 1933-02-11,   MRN: [id number]  HPI 34 yowf  Never smoker with supranuclear palsy still lives independently with  24 nursing care  referred by Janese Banks for cough 04/04/2014 arrived with son from [place]    04/04/2014 1st [facility] office visit/ [facility]   Chief Complaint  Patient presents with  . Pulmonary Consult    Referred by Sherrie Mustache, NP. Pt c/o cough and sputum production for the past yr- worse over the past few months. Sputum is clear in color.   cough varies but seems worse at hs and also sometimes when eats with obvious choking but on modified diet per son "doing better".   No obvious patterns in  day to day or daytime variabilty or assoc chronic cough or cp or chest tightness, subjective wheeze overt sinus or hb symptoms. No unusual exp hx or h/o childhood pna/ asthma or knowledge of premature birth.  Sleeping ok without nocturnal  or early am exacerbation  of respiratory  c/o's or need for noct saba. Also denies any obvious fluctuation of symptoms with weather or environmental changes or other aggravating or alleviating factors except as outlined above   Current Medications, Allergies, Complete Past Medical History, Past Surgical History, Family History, and Social History were reviewed in [employer] record.  ROS  The following are not active complaints unless bolded sore throat, dysphagia, dental problems, itching, sneezing,  nasal congestion or excess/ purulent secretions, ear ache,   fever, chills, sweats, unintended wt loss, pleuritic or exertional cp, hemoptysis,  orthopnea pnd or leg swelling, presyncope, palpitations, heartburn, abdominal pain, anorexia, nausea, vomiting, diarrhea  or change in bowel or urinary habits, change in stools or urine, dysuria,hematuria,  rash, arthralgias, visual complaints, headache, numbness weakness or ataxia or problems with walking or  coordination,  change in mood/affect or memory.         Review of Systems     Objective:   Physical Exam  W/c bound stoic wf with blank expression throughout interview and exam   Wt Readings from Last 3 Encounters:  10/19/13 167 lb 12.8 oz (76.114 kg)  07/04/11 125 lb (56.7 kg)  02/27/11 125 lb (56.7 kg)    Vital signs reviewed    HEENT: nl dentition, turbinates, and orophanx. Nl external ear canals without cough reflex   NECK :  without JVD/Nodes/TM/ nl carotid upstrokes bilaterally   LUNGS: no acc muscle use, clear to A and P bilaterally without cough on insp or exp maneuvers   CV:  RRR  no s3 or murmur or increase in P2, no edema   ABD:  soft and nontender with nl excursion  . No bruits or organomegaly, bowel sounds nl  MS:  warm without deformities, calf tenderness, cyanosis or clubbing  SKIN: warm and dry without lesions    NEURO: sits perfectly still/ minimal spont speech    Last cxr in system from 2013 shows cardiomegaly only          Assessment:

## 2014-04-05 ENCOUNTER — Encounter: Payer: Self-pay | Admitting: Internal Medicine

## 2014-04-05 DIAGNOSIS — R05 Cough: Secondary | ICD-10-CM | POA: Insufficient documentation

## 2014-04-05 DIAGNOSIS — R059 Cough, unspecified: Secondary | ICD-10-CM | POA: Insufficient documentation

## 2014-04-05 NOTE — Assessment & Plan Note (Signed)
The most common causes of chronic cough in immunocompetent adults include the following: upper airway cough syndrome (UACS), previously referred to as postnasal drip syndrome (PNDS), which is caused by variety of rhinosinus conditions; (2) asthma; (3) GERD; (4) chronic bronchitis from cigarette smoking or other inhaled environmental irritants; (5) nonasthmatic eosinophilic bronchitis; and (6) bronchiectasis.   These conditions, singly or in combination, have accounted for up to 94% of the causes of chronic cough in prospective studies.   Other conditions have constituted no >6% of the causes in prospective studies These have included bronchogenic carcinoma, chronic interstitial pneumonia, sarcoidosis, left ventricular failure, ACEI-induced cough, and aspiration from a condition associated with pharyngeal dysfunction.    Chronic cough is often simultaneously caused by more than one condition. A single cause has been found from 38 to 82% of the time, multiple causes from 18 to 62%. Multiply caused cough has been the result of three diseases up to 42% of the time.       Most likely this is  Classic Upper airway cough syndrome, so named because it's frequently impossible to sort out how much is  CR/sinusitis with freq throat clearing (which can be related to primary GERD)   vs  causing  secondary (" extra esophageal")  GERD from wide swings in gastric pressure that occur with throat clearing, often  promoting self use of mint and menthol lozenges that reduce the lower esophageal sphincter tone and exacerbate the problem further in a cyclical fashion.   These are the same pts (now being labeled as having "irritable larynx syndrome" by some cough centers) who not infrequently have a history of having failed to tolerate ace inhibitors,  dry powder inhalers or biphosphonates or report having atypical reflux symptoms that don't respond to standard doses of PPI , and are easily confused as having aecopd or asthma  flares by even experienced allergists/ pulmonologists.   rec adding pepcid at hs plus max 1st gen H1 to see what this has on the most consistent/reproducible aspect of her cough (the one at hs) and obtain an cxr when feasible.  Also emphasized diet / neuro issues would need to be constantly re-eval going forward and feeding tube or not decision made at some point but defer this to primary care's capable hands

## 2014-04-07 ENCOUNTER — Encounter: Payer: Self-pay | Admitting: *Deleted

## 2014-04-14 ENCOUNTER — Telehealth: Payer: Self-pay | Admitting: Internal Medicine

## 2014-04-17 NOTE — Telephone Encounter (Signed)
Called and spoke to [place]. Informed Aaron Edelman of the recs per MW. Appt made with MW on 04/26/14. Aaron Edelman verbalized understanding and denied any further questions or concerns at this time.

## 2014-04-17 NOTE — Telephone Encounter (Signed)
Spoke with Isabella George regarding below.  He verbalized understanding.  States this only answers one of the several questions on the letter.  He would like answers to all the questions if possible.  Offered OV to discuss -- he doesn't feel this is needed for the particular questions on the letter.  Dr. Melvyn Novas, pls advise.  Thank you.

## 2014-04-17 NOTE — Telephone Encounter (Signed)
List was placed in Dr Gustavus Bryant lookat for review

## 2014-04-17 NOTE — Telephone Encounter (Signed)
I looked at it but it's too much to answer over the phone - the key is to swallow more effectively which will eliminate the need to clear the throat but if my recommendations don't help for sure go back to the speech therapists recs

## 2014-04-17 NOTE — Telephone Encounter (Signed)
Be happy to do that in the context of ov with all interested parties present - the issue re omeprazole can be addressed then but no change in my recs in meantime  - if not happy with this response I would be happy to refer them to one of my colleagues> nothing else to offer at this point

## 2014-04-20 ENCOUNTER — Telehealth: Payer: Self-pay | Admitting: *Deleted

## 2014-04-20 NOTE — Telephone Encounter (Signed)
Patient son, Aaron Edelman called and wants to speak with you regarding his mother at [employer]. Son would like for you to call him at # [phone]. He stated he would like to talk with you regarding information from other Providers and discuss her bloodwork she recently had done.

## 2014-04-21 NOTE — Telephone Encounter (Signed)
Attempted to call son yesterday no answer, please let me know if he calls back, will try again

## 2014-04-26 ENCOUNTER — Ambulatory Visit: Payer: Medicare Other | Admitting: Internal Medicine

## 2014-04-28 ENCOUNTER — Telehealth: Payer: Self-pay

## 2014-04-28 NOTE — Telephone Encounter (Signed)
Son Aaron Edelman called back to talk with Janett Billow about mother's lab work done 03/28/14, Janett Billow had called, but he wasn't there. Told him Janett Billow is not in the office today, won't be here until Monday afternoon. This is ok with him. Route message to Janett Billow to call Monday.

## 2014-05-01 NOTE — Telephone Encounter (Signed)
Pt called and given lab results, will start heart healthy diet.

## 2014-05-03 ENCOUNTER — Non-Acute Institutional Stay: Payer: Medicare Other | Admitting: Nurse Practitioner

## 2014-05-03 ENCOUNTER — Encounter: Payer: Self-pay | Admitting: Nurse Practitioner

## 2014-05-03 VITALS — BP 110/60 | HR 80 | Temp 97.6°F

## 2014-05-03 DIAGNOSIS — I998 Other disorder of circulatory system: Secondary | ICD-10-CM | POA: Diagnosis not present

## 2014-05-03 NOTE — Progress Notes (Signed)
Patient ID: Isabella George, female   DOB: [birth year], 81 y.o.   MRN: [id number]    Nursing Home Location:  [place] of Service: Clinic (12)  PCP: Estill Dooms, MD  Allergies  Allergen Reactions  . Adhesive [Tape]   . Bee Venom     Chief Complaint  Patient presents with  . Foot Swelling    and red for couple of weeks. Here with son Aaron Edelman and caregiver    HPI:  Patient is a 81 y.o. female seen today at [facility] for feet swelling and discolored according to caregivers. Caregiver who has been with her for over a year noted that feet have had bluish tint that started a couple of weeks ago. Toes have gotten darker over the last week. Pt denies pains. Reports sensation to bilateral feet.   Review of Systems:  Review of Systems  Constitutional: Negative for activity change, appetite change, fatigue and unexpected weight change.       Limited activity   Respiratory: Positive for cough (with meals and congested). Negative for shortness of breath.   Cardiovascular: Positive for leg swelling (chronic). Negative for chest pain and palpitations.  Musculoskeletal: Positive for gait problem. Negative for arthralgias.       Nonambulatory, pivots to bed and chair.  Skin: Positive for color change. Negative for wound.  Neurological: Negative for dizziness and weakness.  Psychiatric/Behavioral: Positive for confusion. Negative for agitation.    Past Medical History  Diagnosis Date  . Osteoporosis, senile   . Cancer     Breast Cancer  . Acid reflux   . Hip fracture   . Depressive disorder, not elsewhere classified 2012  . Anemia, unspecified   . Acute posthemorrhagic anemia 06/2011  . Malignant neoplasm of breast (female), unspecified site 76    s/p left mastectomy  . Sleep disturbance, unspecified   . Abnormality of gait   . Allergic rhinitis due to pollen   . Unspecified essential hypertension   . Restless leg syndrome  08/04/2012  . Osteoarthritis of both knees   . Rhinitis, non-allergic   . Memory loss   . Osteoarthrosis, unspecified whether generalized or localized, unspecified site 12/17/2011  . Debility, unspecified 07/25/2011  . Aneurysm of unspecified site 07/08/2011  . Unspecified venous (peripheral) insufficiency 07/08/2011  . Closed fracture of intertrochanteric section of femur 07/07/2011  . Dizziness and giddiness 06/23/2011  . Personal history of fall 06/23/2011  . Disturbance of skin sensation 05/26/2011  . Contusion of toe 05/26/2011  . Esophagitis, unspecified 04/14/2011  . Diaphragmatic hernia without mention of obstruction or gangrene 04/14/2011  . Loss of weight 04/14/2011  . Closed fracture of two ribs 04/14/2011  . Ulcer of esophagus 02/25/2011  . Chest pain, unspecified 02/10/2011  . Unspecified hereditary and idiopathic peripheral neuropathy 02/05/2011  . Scoliosis (and kyphoscoliosis), idiopathic   . Unspecified urinary incontinence 12/27/2010  . Closed fracture of rib(s), unspecified 12/27/2010  . 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 Hyperlipidemia    Past Surgical History  Procedure Laterality Date  . Appendectomy    . Tonsillectomy    . Anal fissuer    . Anal fissure repair    . Varicose vein surgery      Ligation of vein  . Tongue surgery    . Hand surgery    . Femur im nail  07/03/2011    Procedure: INTRAMEDULLARY (IM) NAIL FEMORAL;  Surgeon: Melina Schools,  MD;  Location: [facility];  Service: Orthopedics;  Laterality: Left;  Synthes troch nail, jackson bed, c-arm  . Hip fracture surgery    . Breast surgery  1986    Left mastectomy  . Tongue surgery  2000    lump on back of tongue Dr. Owens Shark  . Cataract extraction w/ intraocular lens implant  2000    right  . Orif acetabular fracture  07/03/2011    left hip Dr. Rolena Infante  . Esophagogastroduodenoscopy endoscopy  [id number]    Dr. Cristina Gong with biopsy gastroesophageal junction mucosa with focal ulceration no  intestinal metaplasia, dysphasia, or malignancy. Does have a hiatal hernia with esophagitis  . Colonoscopy w/ biopsies  02/21/2011    polyp-hyperplastic polyp, no adenomatous change or malignancy Dr. Cristina Gong   Social History:   reports that she has never smoked. She has never used smokeless tobacco. She reports that she does not drink alcohol or use illicit drugs.  Family History  Problem Relation Age of Onset  . Heart disease Mother   . Heart disease Brother   . Cancer Father     Liver cancer  . Diabetes Paternal Aunt   . COPD Brother     Medications: Patient's Medications  New Prescriptions   No medications on file  Previous Medications   ASPIRIN 81 MG TABLET    Take 81 mg by mouth daily.   B COMPLEX VITAMINS TABLET    Take 1 tablet by mouth daily.   CHLORPHENIRAMINE (CHLOR-TRIMETON) 4 MG TABLET    Take 4 mg by mouth daily.   DONEPEZIL (ARICEPT) 5 MG TABLET    Take 1 tablet (5 mg total) by mouth at bedtime.   EPINEPHRINE (EPI-PEN) 0.3 MG/0.3 ML SOAJ    Inject 0.3 mLs (0.3 mg total) into the muscle once.   GUAIFENESIN (MUCINEX) 600 MG 12 HR TABLET    Take 600 mg by mouth 2 (two) times daily.   MUPIROCIN OINTMENT (BACTROBAN) 2 %    Apply 1 application topically 2 (two) times daily. APPLY TO AFFECTED AREAS AS NEEDED   OMEPRAZOLE (PRILOSEC) 40 MG CAPSULE    Take 40 mg by mouth daily.   PROBIOTIC PRODUCT (ALIGN PO)    Take by mouth. Take once daily   TRIAMCINOLONE CREAM (KENALOG) 0.1 %       VITAMIN B-12 (CYANOCOBALAMIN) 1000 MCG TABLET    Take 1,000 mcg by mouth daily.   VITAMIN C (ASCORBIC ACID) 500 MG TABLET    Take 300 mg by mouth daily.   Modified Medications   No medications on file  Discontinued Medications   CHOLECALCIFEROL (VITAMIN D) 1000 UNITS TABLET    Take 1,000 Units by mouth daily.   VITAMIN B-12 (CYANOCOBALAMIN) 1000 MCG TABLET    Take 1,000 mcg by mouth daily.     Physical Exam: Filed Vitals:   05/03/14 1414  BP: 110/60  Pulse: 80  Temp: 97.6 F (36.4 C)    TempSrc: Oral  SpO2: 99%    Physical Exam  Constitutional: She is oriented to person, place, and time. She appears well-developed and well-nourished. No distress.  HENT:  Head: Normocephalic and atraumatic.  Cardiovascular: Normal rate, regular rhythm and normal heart sounds.   Pulmonary/Chest: Effort normal and breath sounds normal.  Musculoskeletal: She exhibits edema. She exhibits no tenderness.  Stiff movement of bilateral shoulders and elbows. Unable to fully extend both arms. Back with kyphosis. Left hand with contracture  Neurological: She is alert and oriented to person, place, and time.  Skin: Petechiae (to bilateral distal feet) noted. She is not diaphoretic.  Bilaterally feet cold, >2 sec cap refill, pulses intact Color darkens more distally.   Psychiatric: Her affect is blunt.    Labs reviewed: Basic Metabolic Panel:  Recent Labs  03/28/14 1116  NA 142  K 4.3  CL 104  CO2 22  GLUCOSE 89  BUN 16  CREATININE 0.60  CALCIUM 8.7   Liver Function Tests:  Recent Labs  03/28/14 1116  AST 18  ALT 23  ALKPHOS 66  BILITOT 0.4  PROT 6.8   No results for input(s): LIPASE, AMYLASE in the last 8760 hours. No results for input(s): AMMONIA in the last 8760 hours. CBC:  Recent Labs  03/28/14 1116  WBC 5.6  NEUTROABS 2.6  HGB 14.4  HCT 41.0  MCV 89  PLT 220   TSH:  Recent Labs  03/28/14 1116  TSH 2.180   A1C: No results found for: HGBA1C Lipid Panel:  Recent Labs  03/28/14 1116  HDL 49  LDLCALC 204*  TRIG 102  CHOLHDL 5.6*     Assessment/Plan 1. Peripheral ischemia -discussed with Dr Mariea Clonts, pt with hx of supranuclear palsy, new onset color changes in bilateral feet over the last few weeks worse in the past week. Denies pain, paresthesia or sensation changes.  positive for pulses bilaterally however feet are very cold with reduced cap refill.  Also has petechiae to bilateral toes.  - will get Ambulatory referral to [facility] for  further evaluation and treatment. -return precautions discussed with son and in the event toes become darker with pain needs immediate attention. Son agrees.

## 2014-05-08 ENCOUNTER — Encounter: Payer: Self-pay | Admitting: Vascular Surgery

## 2014-05-08 ENCOUNTER — Other Ambulatory Visit: Payer: Self-pay | Admitting: *Deleted

## 2014-05-08 DIAGNOSIS — M79673 Pain in unspecified foot: Secondary | ICD-10-CM

## 2014-05-09 ENCOUNTER — Ambulatory Visit (HOSPITAL_COMMUNITY)
Admission: RE | Admit: 2014-05-09 | Discharge: 2014-05-09 | Disposition: A | Discharge status: Home / Self Care | Payer: Medicare Other | Source: Ambulatory Visit | Attending: Vascular Surgery | Admitting: Vascular Surgery

## 2014-05-09 ENCOUNTER — Ambulatory Visit (INDEPENDENT_AMBULATORY_CARE_PROVIDER_SITE_OTHER): Payer: Medicare Other | Admitting: Vascular Surgery

## 2014-05-09 ENCOUNTER — Encounter: Payer: Self-pay | Admitting: Vascular Surgery

## 2014-05-09 VITALS — BP 132/56 | HR 74 | Ht 67.0 in | Wt 160.0 lb

## 2014-05-09 DIAGNOSIS — M79673 Pain in unspecified foot: Secondary | ICD-10-CM | POA: Diagnosis not present

## 2014-05-09 DIAGNOSIS — Z789 Other specified health status: Secondary | ICD-10-CM | POA: Insufficient documentation

## 2014-05-09 NOTE — Progress Notes (Signed)
Subjective:     Patient ID: Isabella George, female   DOB: 06-May-1932, 81 y.o.   MRN: [id number]  HPI this 81 year old female was referred by not Senior care for evaluation of peripheral circulation. Has been noted by her family and caregivers that she has had increasing bluish discoloration and cold sensation of both feet. She has no history of nonhealing ulcers infection gangrene or cellulitis. She does not ambulate and is in a wheelchair. Her feet do pain in the dependent position much of the day. She denies any pain or numbness in the feet. She has no history of DVT or superficial thrombophlebitis stasis ulcers or bleeding. When she returns to the supine position the bluish discoloration partially resolves but not completely.  Past Medical History  Diagnosis Date  . Osteoporosis, senile   . Cancer     Breast Cancer  . Acid reflux   . Hip fracture   . Depressive disorder, not elsewhere classified 2012  . Anemia, unspecified   . Acute posthemorrhagic anemia 06/2011  . Malignant neoplasm of breast (female), unspecified site 24    s/p left mastectomy  . Sleep disturbance, unspecified   . Abnormality of gait   . Allergic rhinitis due to pollen   . Unspecified essential hypertension   . Restless leg syndrome 08/04/2012  . Osteoarthritis of both knees   . Rhinitis, non-allergic   . Memory loss   . Osteoarthrosis, unspecified whether generalized or localized, unspecified site 12/17/2011  . Debility, unspecified 07/25/2011  . Aneurysm of unspecified site 07/08/2011  . Unspecified venous (peripheral) insufficiency 07/08/2011  . Closed fracture of intertrochanteric section of femur 07/07/2011  . Dizziness and giddiness 06/23/2011  . Personal history of fall 06/23/2011  . Disturbance of skin sensation 05/26/2011  . Contusion of toe 05/26/2011  . Esophagitis, unspecified 04/14/2011  . Diaphragmatic hernia without mention of obstruction or gangrene 04/14/2011  . Loss of weight 04/14/2011  . Closed  fracture of two ribs 04/14/2011  . Ulcer of esophagus 02/25/2011  . Chest pain, unspecified 02/10/2011  . Unspecified hereditary and idiopathic peripheral neuropathy 02/05/2011  . Scoliosis (and kyphoscoliosis), idiopathic   . Unspecified urinary incontinence 12/27/2010  . Closed fracture of rib(s), unspecified 12/27/2010  . 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 Hyperlipidemia     History  Substance Use Topics  . Smoking status: Never Smoker   . Smokeless tobacco: Never Used  . Alcohol Use: 0.0 oz/week    0 Standard drinks or equivalent per week     Comment: rare    Family History  Problem Relation Age of Onset  . Heart disease Mother   . Heart disease Brother   . Heart attack Brother   . Cancer Father     Liver cancer  . Diabetes Paternal Aunt   . COPD Brother   . Deep vein thrombosis Brother   . Varicose Veins Brother   . Hyperlipidemia Son     Allergies  Allergen Reactions  . Adhesive [Tape]   . Bee Venom      Current outpatient prescriptions:  .  aspirin 81 MG tablet, Take 81 mg by mouth daily., Disp: , Rfl:  .  b complex vitamins tablet, Take 1 tablet by mouth daily., Disp: , Rfl:  .  chlorpheniramine (CHLOR-TRIMETON) 4 MG tablet, Take 4 mg by mouth daily., Disp: , Rfl:  .  Cholecalciferol (VITAMIN D3) LIQD, by Does not apply route. 1,000 units daily, Disp: , Rfl:  .  donepezil (ARICEPT) 5 MG tablet, Take 1 tablet (5 mg total) by mouth at bedtime., Disp: 30 tablet, Rfl: 5 .  EPINEPHrine (EPI-PEN) 0.3 mg/0.3 mL SOAJ, Inject 0.3 mLs (0.3 mg total) into the muscle once., Disp: 1 Device, Rfl: 1 .  famotidine (PEPCID AC) 10 MG chewable tablet, Chew 10 mg by mouth 2 (two) times daily., Disp: , Rfl:  .  guaiFENesin (MUCINEX) 600 MG 12 hr tablet, Take 600 mg by mouth 2 (two) times daily., Disp: , Rfl:  .  mupirocin ointment (BACTROBAN) 2 %, Apply 1 application topically 2 (two) times daily. APPLY TO AFFECTED AREAS AS NEEDED, Disp: 22 g, Rfl: 0 .   omeprazole (PRILOSEC) 40 MG capsule, Take 40 mg by mouth daily., Disp: , Rfl:  .  Probiotic Product (ALIGN PO), Take by mouth. Take once daily, Disp: , Rfl:  .  triamcinolone cream (KENALOG) 0.1 %, , Disp: , Rfl:  .  vitamin B-12 (CYANOCOBALAMIN) 1000 MCG tablet, Take 1,000 mcg by mouth daily., Disp: , Rfl:  .  vitamin C (ASCORBIC ACID) 500 MG tablet, Take 300 mg by mouth daily. , Disp: , Rfl:  .  [DISCONTINUED] fesoterodine (TOVIAZ) 4 MG TB24, Take 4 mg by mouth at bedtime., Disp: , Rfl:   BP 132/56 mmHg  Pulse 74  Ht 5\' 7"  (1.702 m)  Wt 160 lb (72.576 kg)  BMI 25.05 kg/m2  SpO2 100%  Body mass index is 25.05 kg/(m^2).           Review of Systems patient does not answer questions very effectively. Does have a history of breast cancer with left mastectomy. So history of reflux esophagitis. Patient lives at home with her son.     Objective:   Physical Exam BP 132/56 mmHg  Pulse 74  Ht 5\' 7"  (1.702 m)  Wt 160 lb (72.576 kg)  BMI 25.05 kg/m2  SpO2 100%  Gen.-alert and oriented x3 in no apparent distress-chronically ill and in a wheelchair  HEENT normal for age Lungs no rhonchi or wheezing Cardiovascular regular rhythm no murmurs carotid pulses 3+ palpable no bruits audible Abdomen soft nontender no palpable masses Musculoskeletal free of  major deformities Skin clear -no rashes Neurologic normal Lower extremities 3+ femoral and dorsalis pedis pulses palpable bilaterally with 1+ edema. Bluish discoloration of all of the toes of both feet. No evidence of ulceration, drainage, gangrene, cellulitis, or any other type of infection. No defects in scan. Good venous refill noted. No varicosities noted.  Today I will ordered ABIs and waveforms bilaterally which I reviewed and interpreted. She does have triphasic flow to both feet with normal ABIs of greater than 1.0 bilaterally.       Assessment:     Bluish discoloration-venous congestion-of both lower extremities with no  evidence of arterial insufficiency    Plan:     Discussed this at length with patient's son and the fact that she does not need further vascular evaluation Slight elevation of foot of bed 2 inches might be helpful but she will probably have recurrence of venous congestion during the day when her feet are hanging in a dependent position She is asymptomatic Would be diligent in trying to prevent any type of pressure sores and this sedentary elderly patient but there is no evidence of significant arterial insufficiency

## 2014-08-02 ENCOUNTER — Telehealth: Payer: Self-pay | Admitting: Gynecology

## 2014-08-02 NOTE — Telephone Encounter (Signed)
Message to Dr Phineas Real requesting approval for Prolia injection due 09/08/14. Pt has not had a complete exam since 06/2013. Calcium 8.7  03/2014.

## 2014-08-02 NOTE — Telephone Encounter (Deleted)
This patient has not seen you since 06/15/2013. She has had two Prolia injections in 2015 and is due for third 09/08/14. Do you want to see her also on the day of Prolia injection or prior to injection.  Noted confined to wheel chair in notes from MD 05/09/14.   Calcium 8.7 on 03/28/14   I did not want to schedule Prolia until you advise me.  Note to Dr Phineas Real.

## 2014-08-10 NOTE — Telephone Encounter (Signed)
I will see her the same day before injection. May or may not examine her depending upon her condition , note from Dr Phineas Real.  I called and left message for pt. . Next injection due after 09/07/14. Insurance benefits are Medicare deductible ($166.00) after deductible met $0 cost to patient. Asked pt.to call and schedule appointment after 09/07/14.

## 2014-08-15 ENCOUNTER — Encounter: Payer: Self-pay | Admitting: Gynecology

## 2014-08-15 NOTE — Telephone Encounter (Signed)
Phone call to son Julizza Sassone regarding Prolia injection due after 09/08/14. He will look at dates that his mother can come in for injection and office visit with Dr Phineas Real on the same day and return the call to schedule.

## 2014-08-28 ENCOUNTER — Other Ambulatory Visit: Payer: Self-pay | Admitting: Neurology

## 2014-08-28 NOTE — Telephone Encounter (Signed)
Rx sent 

## 2014-08-30 ENCOUNTER — Encounter: Payer: Medicare Other | Admitting: Internal Medicine

## 2014-09-12 NOTE — Telephone Encounter (Signed)
Left VM for [name] to call regarding Prolia injection for his mother Galilee Pierron.

## 2014-09-22 ENCOUNTER — Encounter: Payer: Self-pay | Admitting: Gynecology

## 2014-09-22 ENCOUNTER — Ambulatory Visit (INDEPENDENT_AMBULATORY_CARE_PROVIDER_SITE_OTHER): Payer: Medicare Other | Admitting: Gynecology

## 2014-09-22 DIAGNOSIS — M810 Age-related osteoporosis without current pathological fracture: Secondary | ICD-10-CM

## 2014-09-22 MED ORDER — DENOSUMAB 60 MG/ML ~~LOC~~ SOLN
60.0000 mg | Freq: Once | SUBCUTANEOUS | Status: AC
Start: 2014-09-22 — End: 2014-09-22
  Administered 2014-09-22: 60 mg via SUBCUTANEOUS

## 2014-09-22 NOTE — Progress Notes (Signed)
Patient presents for her Prolia shot and it was noticed by the staff arranging the appointment that she had not had a GYN exam in over a year. They schedule her also for a GYN exam.  It was obvious upon entry into the room that the patient has undergone a significant decline in her health, both physical and mental. She was with 2 attendants required to keep her upright and she was unaware of my presence in the room, unable to answer questions.  I did not perform an exam as I feel given her situation this would add little to her healthcare and feel that no further GYN exams are necessary unless there is an issue. I also do not feel she needs to return for continued Prolia at this facility and if her primary physician caring for her at her facility feels it warrants continuing then they can make arrangements for this through the facility.  I think given her total situation that ongoing prophylactic osteoporosis treatment is probably not necessary as she is not ambulatory and she has had years of treatment.

## 2014-09-26 NOTE — Telephone Encounter (Signed)
Prolia injection given on 09/22/14. Per Dr Phineas Real note from 09/22/14 future Prolia injections will stop due to declining health of patient and has been on Prolia several years.

## 2014-10-02 ENCOUNTER — Telehealth: Payer: Self-pay | Admitting: Neurology

## 2014-10-02 DIAGNOSIS — R1319 Other dysphagia: Secondary | ICD-10-CM

## 2014-10-02 NOTE — Telephone Encounter (Signed)
Pt son Isabella George wants to see about a  Test to be done  please call [phone] ok to leave message

## 2014-10-02 NOTE — Telephone Encounter (Signed)
Pt's son Aaron Edelman  called again in regards to a new swallow study and would like a call back at [phone]/Dawn

## 2014-10-02 NOTE — Telephone Encounter (Signed)
Left message on machine for patient to call back.

## 2014-10-02 NOTE — Telephone Encounter (Signed)
Pt's husband Aaron Edelman returned your call/Dawn CB# [phone]

## 2014-10-03 ENCOUNTER — Other Ambulatory Visit (HOSPITAL_COMMUNITY): Payer: Self-pay | Admitting: Neurology

## 2014-10-03 DIAGNOSIS — R1314 Dysphagia, pharyngoesophageal phase: Secondary | ICD-10-CM

## 2014-10-03 NOTE — Telephone Encounter (Signed)
Spoke with patient's son. He states that his mother's swallowing is getting worse. She is having a lot of coughing during meals and having more trouble swallowing pills. He would like her to have another swallow study to talk about what additional restrictions she needs. Last study in 01/2014. Please advise. He also wants a copy of last study and this has been placed up front for him to pick up.

## 2014-10-03 NOTE — Telephone Encounter (Signed)
Okay to order MBE.

## 2014-10-03 NOTE — Telephone Encounter (Signed)
Patient's son made aware:   We have scheduled you at [facility] for your modified barium swallow on 10/13/2014 at 1:00 pm. Please arrive 15 minutes prior and go to 1st floor radiology. If you need to reschedule for any reason please call [phone].

## 2014-10-11 ENCOUNTER — Non-Acute Institutional Stay: Payer: Medicare Other | Admitting: Internal Medicine

## 2014-10-11 ENCOUNTER — Encounter: Payer: Self-pay | Admitting: Internal Medicine

## 2014-10-11 VITALS — BP 126/78 | HR 69 | Temp 98.1°F | Resp 12

## 2014-10-11 DIAGNOSIS — G231 Progressive supranuclear ophthalmoplegia [Steele-Richardson-Olszewski]: Secondary | ICD-10-CM | POA: Diagnosis not present

## 2014-10-11 DIAGNOSIS — R5382 Chronic fatigue, unspecified: Secondary | ICD-10-CM | POA: Diagnosis not present

## 2014-10-11 DIAGNOSIS — L659 Nonscarring hair loss, unspecified: Secondary | ICD-10-CM | POA: Diagnosis not present

## 2014-10-11 DIAGNOSIS — Z23 Encounter for immunization: Secondary | ICD-10-CM

## 2014-10-11 DIAGNOSIS — E785 Hyperlipidemia, unspecified: Secondary | ICD-10-CM

## 2014-10-11 MED ORDER — EPINEPHRINE 0.3 MG/0.3ML IJ SOAJ: 0.3000 mg | Freq: Once | INTRAMUSCULAR | Status: AC

## 2014-10-11 NOTE — Progress Notes (Signed)
Patient ID: Isabella George, female   DOB: 20-Aug-1932, 81 y.o.   MRN: [id number]   Location:  [facility]  Code Status: DNR  Goals of Care:Advanced Directive information Does patient have an advance directive?: Yes, Type of Advance Directive: [place];Living will, Does patient want to make changes to advanced directive?: No - Patient declined  Chief Complaint  Patient presents with  . Medical Management of Chronic Issues    6 month follow-up (1st visit with Dr.Romir Klimowicz). Patient with hairloss, requesting order for labs, c/o extreme fatigue   . Medication Refill    New order for Epi Pen   . Medication Management    Discuss Mucinex, patient d/c'ed due to inability to swallow    HPI: Patient is a 81 y.o. white female seen in the [facility] today for medical mgt of chronic diseases and several acute complaints including hair loss and extreme fatigue.    She has progressive supranuclear palsy and Dr. Carles Collet felt she also has had a stroke in the past.    Last seen 6 mos ago by Janett Billow.  I have not seen her before today.  Missed appt a month or so ago.  Depression screen PHQ 2/9 10/11/2014  Decreased Interest 0  Down, Depressed, Hopeless 0  PHQ - 2 Score 0   Fall Risk  10/11/2014  Falls in the past year? No   Tiredness oscillates.  Overall, more fatigued, but not a tremendous difference.  Has had a sleep disorder longstanding.  More and more hair coming out during combing.  TSH was normal 6 mos ago.    LDL was 204 last visit.  Her son notes they have not really modified mom's diet.    Purple discoloration of feet:  Son says better, but caregiver does not sem to think so.  Vascular surgery did not feel there was a significant arterial insufficiency problem.  Dysphagia:  Has barium swallow coming up on Friday.  She was having more difficulty swallowing and stopped mucinex.    Has 24 hr caregivers and her son also assists with the transfers.    Sleeps more in  the early am than throughout the night.    She can see ok, but is sensitive to light.  Has had some blepharitis episodes.    Hears well.  Is dependent in all ADLs.  Watches tv most of the time.    Review of Systems:  Review of Systems  Constitutional: Positive for malaise/fatigue. Negative for fever and chills.  HENT: Positive for congestion.        Dysphagia  Eyes: Negative for blurred vision.       Sensitive to light  Respiratory: Negative for cough and shortness of breath.   Cardiovascular: Negative for chest pain and leg swelling.       Still has purple feet  Gastrointestinal: Positive for constipation. Negative for abdominal pain, blood in stool and melena.  Genitourinary: Negative for dysuria.  Musculoskeletal: Negative for falls.  Skin: Positive for itching and rash.       Intermittent rashes (currently some on forehead)  Neurological: Positive for weakness. Negative for dizziness and loss of consciousness.  Endo/Heme/Allergies: Bruises/bleeds easily.  Psychiatric/Behavioral: Positive for memory loss.    Past Medical History  Diagnosis Date  . Osteoporosis, senile   . Cancer     Breast Cancer  . Acid reflux   . Hip fracture   . Depressive disorder, not elsewhere classified 2012  . Anemia, unspecified   .  Acute posthemorrhagic anemia 06/2011  . Malignant neoplasm of breast (female), unspecified site 71    s/p left mastectomy  . Sleep disturbance, unspecified   . Abnormality of gait   . Allergic rhinitis due to pollen   . Unspecified essential hypertension   . Restless leg syndrome 08/04/2012  . Osteoarthritis of both knees   . Rhinitis, non-allergic   . Memory loss   . Osteoarthrosis, unspecified whether generalized or localized, unspecified site 12/17/2011  . Debility, unspecified 07/25/2011  . Aneurysm of unspecified site 07/08/2011  . Unspecified venous (peripheral) insufficiency 07/08/2011  . Closed fracture of intertrochanteric section of femur 07/07/2011  .  Dizziness and giddiness 06/23/2011  . Personal history of fall 06/23/2011  . Disturbance of skin sensation 05/26/2011  . Contusion of toe 05/26/2011  . Esophagitis, unspecified 04/14/2011  . Diaphragmatic hernia without mention of obstruction or gangrene 04/14/2011  . Loss of weight 04/14/2011  . Closed fracture of two ribs 04/14/2011  . Ulcer of esophagus 02/25/2011  . Chest pain, unspecified 02/10/2011  . Unspecified hereditary and idiopathic peripheral neuropathy 02/05/2011  . Scoliosis (and kyphoscoliosis), idiopathic   . Unspecified urinary incontinence 12/27/2010  . Closed fracture of rib(s), unspecified 12/27/2010  . 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 Hyperlipidemia     Past Surgical History  Procedure Laterality Date  . Appendectomy    . Tonsillectomy    . Anal fissuer    . Anal fissure repair    . Varicose vein surgery      Ligation of vein  . Tongue surgery    . Hand surgery    . Femur im nail  07/03/2011    Procedure: INTRAMEDULLARY (IM) NAIL FEMORAL;  Surgeon: Melina Schools, MD;  Location: [facility];  Service: Orthopedics;  Laterality: Left;  Synthes troch nail, jackson bed, c-arm  . Hip fracture surgery    . Breast surgery  1986    Left mastectomy  . Tongue surgery  2000    lump on back of tongue Dr. Owens Shark  . Cataract extraction w/ intraocular lens implant  2000    right  . Orif acetabular fracture  07/03/2011    left hip Dr. Rolena Infante  . Esophagogastroduodenoscopy endoscopy  [id number]    Dr. Cristina Gong with biopsy gastroesophageal junction mucosa with focal ulceration no intestinal metaplasia, dysphasia, or malignancy. Does have a hiatal hernia with esophagitis  . Colonoscopy w/ biopsies  02/21/2011    polyp-hyperplastic polyp, no adenomatous change or malignancy Dr. Cristina Gong    Social History:   reports that she has never smoked. She has never used smokeless tobacco. She reports that she drinks alcohol. She reports that she does not use illicit  drugs.  Allergies  Allergen Reactions  . Adhesive [Tape]   . Bee Venom     Medications: Patient's Medications  New Prescriptions   No medications on file  Previous Medications   ASPIRIN 81 MG TABLET    Take 81 mg by mouth daily.   CHLORPHENIRAMINE (CHLOR-TRIMETON) 4 MG TABLET    Take 8 mg by mouth daily.    CHOLECALCIFEROL (VITAMIN D3) LIQD    by Does not apply route. 1,000 units daily   DENOSUMAB (PROLIA) 60 MG/ML SOLN INJECTION    Inject 60 mg into the skin every 6 (six) months. Administer in upper arm, thigh, or abdomen   DONEPEZIL (ARICEPT) 5 MG TABLET    TAKE ONE TABLET BY MOUTH ONCE DAILY AT BEDTIME   FAMOTIDINE (  PEPCID AC) 10 MG CHEWABLE TABLET    Chew 10 mg by mouth 2 (two) times daily.   MUPIROCIN OINTMENT (BACTROBAN) 2 %    Apply 1 application topically 2 (two) times daily. APPLY TO AFFECTED AREAS AS NEEDED   OMEPRAZOLE (PRILOSEC) 40 MG CAPSULE    Take 40 mg by mouth 2 (two) times daily.    PROBIOTIC PRODUCT (ALIGN PO)    Take by mouth. Take once daily   TRIAMCINOLONE CREAM (KENALOG) 0.1 %       VITAMIN B-12 (CYANOCOBALAMIN) 1000 MCG TABLET    Take 1,000 mcg by mouth daily.  Modified Medications   Modified Medication Previous Medication   EPINEPHRINE 0.3 MG/0.3 ML IJ SOAJ INJECTION EPINEPHrine (EPI-PEN) 0.3 mg/0.3 mL SOAJ      Inject 0.3 mLs (0.3 mg total) into the muscle once.    Inject 0.3 mLs (0.3 mg total) into the muscle once.  Discontinued Medications   B COMPLEX VITAMINS TABLET    Take 1 tablet by mouth daily.   GUAIFENESIN (MUCINEX) 600 MG 12 HR TABLET    Take 600 mg by mouth 2 (two) times daily.   VITAMIN C (ASCORBIC ACID) 500 MG TABLET    Take 300 mg by mouth daily.      Physical Exam: Filed Vitals:   10/11/14 1332  BP: 126/78  Pulse: 69  Temp: 98.1 F (36.7 C)  TempSrc: Oral  Resp: 12  SpO2: 95%   There is no weight on file to calculate BMI.could not obtain weight as pt unable to stand unsupported  Physical Exam  Constitutional: She appears  well-developed and well-nourished. No distress.  HENT:  Head: Normocephalic and atraumatic.  Eyes:  Wearing sunglasses  Cardiovascular: Normal rate, regular rhythm, normal heart sounds and intact distal pulses.   Feet still purple  Pulmonary/Chest: Effort normal and breath sounds normal.  Abdominal: Soft. Bowel sounds are normal. She exhibits no distension. There is no tenderness.  Musculoskeletal: She exhibits no edema or tenderness.  Contractures of hands bilaterally, stiffness of all 4 extremities  Neurological: She is alert. She exhibits abnormal muscle tone.  Responded appropriately but with very short answers or grunts meaning yes or no that are difficult to decipher  Skin: Skin is warm and dry.  Petechial rash on forehead     Labs reviewed: Basic Metabolic Panel:  Recent Labs  03/28/14 1116  NA 142  K 4.3  CL 104  CO2 22  GLUCOSE 89  BUN 16  CREATININE 0.60  CALCIUM 8.7  TSH 2.180   Liver Function Tests:  Recent Labs  03/28/14 1116  AST 18  ALT 23  ALKPHOS 66  BILITOT 0.4  PROT 6.8   No results for input(s): LIPASE, AMYLASE in the last 8760 hours. No results for input(s): AMMONIA in the last 8760 hours. CBC:  Recent Labs  03/28/14 1116  WBC 5.6  NEUTROABS 2.6  HGB 14.4  HCT 41.0  MCV 89  PLT 220   Lipid Panel:  Recent Labs  03/28/14 1116  CHOL 273*  HDL 49  LDLCALC 204*  TRIG 102  CHOLHDL 5.6*   No results found for: HGBA1C  Procedures since last appt: Reviewed notes from multiple specialists since her Jan visit  Patient Care Team: Gayland Curry, DO as PCP - General ([place]) Lindwood Coke, MD as Consulting Physician (Dermatology) Bennetta Laos, MD as Consulting Physician (Obstetrics and Gynecology) Clent Jacks, MD as Consulting Physician (Ophthalmology) Lennon Alstrom, MD as Consulting Physician (  Neurology) Melina Schools, MD as Consulting Physician (Orthopedic Surgery) [facility] Gaynelle Arabian, MD as Consulting Physician (Orthopedic Surgery)  Assessment/Plan 1. Chronic fatigue - discussed that this is more than likely due to her PSP and not reversible, her son requests labs be done (last 6 mos ago) - CBC with Differential/Platelet; Future - Basic metabolic panel; Future - Lipid panel; Future - TSH; Future  2. PSP (progressive supranuclear palsy) - late stage, requires help with all ADLs, speaks minimally, is almost immobile on her own -would advise for hospice care, but clearly her son is not ready for this at this time--she is still going out to several specialists - CBC with Differential/Platelet; Future - Basic metabolic panel; Future  3. Hair loss -I think this is age-related, but r/o thyroid (was normal 6 mos ago) - CBC with Differential/Platelet; Future - Basic metabolic panel; Future - TSH; Future  4. Hyperlipidemia -LDL was over 200 last time -her son notes that they will try harder to adjust her diet to help with this -I would not push this issue with her poor prognosis  -she has dysphagia so whatever food she can get in that tastes good is great - CBC with Differential/Platelet; Future - Basic metabolic panel; Future - Lipid panel; Future  5. Need for vaccination with 13-polyvalent pneumococcal conjugate vaccine Need for prevnar:  Given today  Labs/tests ordered:  Cbc, bmp, tsh, flp at [facility] office Next appt:  6 mos med mgt  Isabella George, D.O. [facility] [address], [place] [address] Cell Phone (Mon-Fri 8am-5pm):  [phone] On Call:  [phone] & follow prompts after 5pm & weekends Office Phone:  [phone] Office Fax:  [phone]

## 2014-10-13 ENCOUNTER — Ambulatory Visit (HOSPITAL_COMMUNITY)
Admission: RE | Admit: 2014-10-13 | Discharge: 2014-10-13 | Disposition: A | Discharge status: Home / Self Care | Payer: Medicare Other | Source: Ambulatory Visit | Attending: Neurology | Admitting: Neurology

## 2014-10-13 DIAGNOSIS — R1314 Dysphagia, pharyngoesophageal phase: Secondary | ICD-10-CM

## 2014-10-13 DIAGNOSIS — F458 Other somatoform disorders: Secondary | ICD-10-CM | POA: Diagnosis not present

## 2014-10-13 DIAGNOSIS — R1319 Other dysphagia: Secondary | ICD-10-CM

## 2014-10-13 DIAGNOSIS — F1314 Sedative, hypnotic or anxiolytic abuse with sedative, hypnotic or anxiolytic-induced mood disorder: Secondary | ICD-10-CM | POA: Diagnosis present

## 2014-10-13 NOTE — Procedures (Signed)
Objective Swallowing Evaluation: Other (Comment) (MBS)  Patient Details  Name: Isabella George MRN: [id number] Date of Birth: April 05, 1932  Today's Date: 10/13/2014 Time: SLP Start Time (ACUTE ONLY): 1318-SLP Stop Time (ACUTE ONLY): 1410 SLP Time Calculation (min) (ACUTE ONLY): 52 min  Past Medical History:  Past Medical History  Diagnosis Date  . Osteoporosis, senile   . Cancer     Breast Cancer  . Acid reflux   . Hip fracture   . Depressive disorder, not elsewhere classified 2012  . Anemia, unspecified   . Acute posthemorrhagic anemia 06/2011  . Malignant neoplasm of breast (female), unspecified site 60    s/p left mastectomy  . Sleep disturbance, unspecified   . Abnormality of gait   . Allergic rhinitis due to pollen   . Unspecified essential hypertension   . Restless leg syndrome 08/04/2012  . Osteoarthritis of both knees   . Rhinitis, non-allergic   . Memory loss   . Osteoarthrosis, unspecified whether generalized or localized, unspecified site 12/17/2011  . Debility, unspecified 07/25/2011  . Aneurysm of unspecified site 07/08/2011  . Unspecified venous (peripheral) insufficiency 07/08/2011  . Closed fracture of intertrochanteric section of femur 07/07/2011  . Dizziness and giddiness 06/23/2011  . Personal history of fall 06/23/2011  . Disturbance of skin sensation 05/26/2011  . Contusion of toe 05/26/2011  . Esophagitis, unspecified 04/14/2011  . Diaphragmatic hernia without mention of obstruction or gangrene 04/14/2011  . Loss of weight 04/14/2011  . Closed fracture of two ribs 04/14/2011  . Ulcer of esophagus 02/25/2011  . Chest pain, unspecified 02/10/2011  . Unspecified hereditary and idiopathic peripheral neuropathy 02/05/2011  . Scoliosis (and kyphoscoliosis), idiopathic   . Unspecified urinary incontinence 12/27/2010  . Closed fracture of rib(s), unspecified 12/27/2010  . 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 Hyperlipidemia    Past Surgical  History:  Past Surgical History  Procedure Laterality Date  . Appendectomy    . Tonsillectomy    . Anal fissuer    . Anal fissure repair    . Varicose vein surgery      Ligation of vein  . Tongue surgery    . Hand surgery    . Femur im nail  07/03/2011    Procedure: INTRAMEDULLARY (IM) NAIL FEMORAL;  Surgeon: Melina Schools, MD;  Location: [facility];  Service: Orthopedics;  Laterality: Left;  Synthes troch nail, jackson bed, c-arm  . Hip fracture surgery    . Breast surgery  1986    Left mastectomy  . Tongue surgery  2000    lump on back of tongue Dr. Owens Shark  . Cataract extraction w/ intraocular lens implant  2000    right  . Orif acetabular fracture  07/03/2011    left hip Dr. Rolena Infante  . Esophagogastroduodenoscopy endoscopy  [id number]    Dr. Cristina Gong with biopsy gastroesophageal junction mucosa with focal ulceration no intestinal metaplasia, dysphasia, or malignancy. Does have a hiatal hernia with esophagitis  . Colonoscopy w/ biopsies  02/21/2011    polyp-hyperplastic polyp, no adenomatous change or malignancy Dr. Cristina Gong   HPI:  Other Pertinent Information: 81 yo female referred by Dr Tat (per son Brian's request) for MBS due to increasing problems with swallowing.  Son and caregivers x 2 present.  PMH + for PSP, memory loss, debility, wheelchair bound, gait abnormality, breast cancer, diaphragmatic hernia, esophagitis, scolisos, kyphoscoliosis, esophageal ulcer, and per son reflux.  Significant PSH re: swallow includes + for EGD 02/2013 with findings  of esophagitis and hiatal hernia, lingual surgery. Pt has been consuming thin liquids between meals and thicker liquids with medications/foods.  Per son, she has maintained hydration and has not had pulmonary infections.  She has however been having increased coughing with intake over the last few months.    No Data Recorded  Assessment / Plan / Recommendation CHL IP CLINICAL IMPRESSIONS 10/13/2014  Therapy Diagnosis Moderate oral phase  dysphagia;Moderate pharyngeal phase dysphagia  Clinical Impression   Patient's swallow is nearly consistent with findings from [facility] 01/2014.  She continues with moderate oropharyngeal sensorimotor deficits.  Delay in oral transiting noted with decreased propulsion.    Mild amount of vallecular residuals present due to weak tongue base retraction (worse with solids/pudding than liquids) WITHOUT pt awareness.  Pharyngeal swallow was delayed to pyriform sinus with liquids but triggered at vallecular space with solids/pudding *which is more timely than 01/2014 performance.   Cued dry swallow was effortful and marginally effective to decrease.    Minimal amount of silent aspiration of thin via cup noted due to decreased laryngeal elevation and delay.  Cued throat clear removed trace aspirates.    Son reports when pt's voice is gurgly, he has her speak loudly and this will clear it = as she does not cough or "hock" on command.    Of note, pt did not cough during MBS even with trace aspiration.   Suspect pt episodically aspirates based on MBS and family report.    Pt's son is aware of his mother's dysphagia and he manages it optimally for hydration/nutrition/comfort.  Recommend continue diet = using nectar liquids with food/medicine and thin drinks between meals with precautions.    Recommend modify to consuming only thinw water between meals if pt demonstrating recurrent colds, worsening cough that may indicate decreased aspiration tolerance. Thanks for this order.        CHL IP TREATMENT RECOMMENDATION 10/13/2014  Treatment Recommendations No treatment recommended at this time     CHL IP DIET RECOMMENDATION 10/13/2014  SLP Diet Recommendations Dysphagia 3 (Mech soft);Thin  Liquid Administration via No straws, pt did not use prior to admit  Medication Administration Whole meds with puree  Compensations Slow rate;Small sips/bites;Check for pocketing  Postural Changes and/or Swallow Maneuvers (None)      CHL IP OTHER RECOMMENDATIONS 10/13/2014  Recommended Consults (None)  Oral Care Recommendations Oral care before and after PO  Other Recommendations Order thickener from pharmacy     CHL IP FOLLOW UP RECOMMENDATIONS 02/01/2014  Follow up Recommendations Home health SLP     CHL IP FREQUENCY AND DURATION 02/14/2013  Speech Therapy Frequency (ACUTE ONLY) min 2x/week  Treatment Duration 2 weeks         CHL IP REASON FOR REFERRAL 10/13/2014  Reason for Referral Objectively evaluate swallowing function     CHL IP ORAL PHASE 10/13/2014  Oral Phase Impaired      CHL IP PHARYNGEAL PHASE 10/13/2014  Pharyngeal Phase Impaired  Pharyngeal Comment pt with delayed reflexive swallow to decrease clearance       CHL IP CERVICAL ESOPHAGEAL PHASE 10/13/2014  Cervical Esophageal Phase Impaired  Cervical Esophageal Comment barium tablet appeared in stomach upon xray sweep     CHL IP GO 10/13/2014  Functional Assessment Tool Used MBS, clinical judgement  Functional Limitations Swallowing  Swallow Current Status (Y5035) CK  Swallow Goal Status (W6568) CK  Swallow Discharge Status (L2751) [place], [place] SLP [phone]

## 2014-10-17 ENCOUNTER — Other Ambulatory Visit: Payer: Self-pay

## 2014-10-26 ENCOUNTER — Telehealth: Payer: Self-pay | Admitting: *Deleted

## 2014-10-26 ENCOUNTER — Other Ambulatory Visit: Payer: Medicare Other

## 2014-10-26 DIAGNOSIS — E785 Hyperlipidemia, unspecified: Secondary | ICD-10-CM

## 2014-10-26 DIAGNOSIS — R5382 Chronic fatigue, unspecified: Secondary | ICD-10-CM

## 2014-10-26 DIAGNOSIS — G231 Progressive supranuclear ophthalmoplegia [Steele-Richardson-Olszewski]: Secondary | ICD-10-CM

## 2014-10-26 DIAGNOSIS — L659 Nonscarring hair loss, unspecified: Secondary | ICD-10-CM

## 2014-10-26 NOTE — Telephone Encounter (Signed)
Aaron Edelman, Patient caregiver called and left message stating that patient was here to have bloodwork drawn and he wanted to know what was being drawn.  Tried calling back and left message to return call.

## 2014-10-27 LAB — BASIC METABOLIC PANEL
BUN/Creatinine Ratio: 22 (ref 11–26)
BUN: 15 mg/dL (ref 8–27)
CO2: 21 mmol/L (ref 18–29)
Calcium: 9 mg/dL (ref 8.7–10.3)
Chloride: 101 mmol/L (ref 97–108)
Creatinine, Ser: 0.68 mg/dL (ref 0.57–1.00)
GFR calc Af Amer: 96 mL/min/{1.73_m2} (ref >59)
GFR calc non Af Amer: 83 mL/min/{1.73_m2} (ref >59)
Glucose: 91 mg/dL (ref 65–99)
Potassium: 4.5 mmol/L (ref 3.5–5.2)
Sodium: 140 mmol/L (ref 134–144)

## 2014-10-27 LAB — CBC WITH DIFFERENTIAL/PLATELET
Basophils Absolute: 0.1 10*3/uL (ref 0.0–0.2)
Basos: 1 %
EOS (ABSOLUTE): 0.3 10*3/uL (ref 0.0–0.4)
Eos: 6 %
Hematocrit: 42.3 % (ref 34.0–46.6)
Hemoglobin: 14.4 g/dL (ref 11.1–15.9)
Immature Grans (Abs): 0 10*3/uL (ref 0.0–0.1)
Immature Granulocytes: 0 %
Lymphocytes Absolute: 1.9 10*3/uL (ref 0.7–3.1)
Lymphs: 38 %
MCH: 30.8 pg (ref 26.6–33.0)
MCHC: 34 g/dL (ref 31.5–35.7)
MCV: 91 fL (ref 79–97)
Monocytes Absolute: 0.5 10*3/uL (ref 0.1–0.9)
Monocytes: 11 %
Neutrophils Absolute: 2.2 10*3/uL (ref 1.4–7.0)
Neutrophils: 44 %
Platelets: 234 10*3/uL (ref 150–379)
RBC: 4.67 x10E6/uL (ref 3.77–5.28)
RDW: 13.2 % (ref 12.3–15.4)
WBC: 4.9 10*3/uL (ref 3.4–10.8)

## 2014-10-27 LAB — LIPID PANEL
Chol/HDL Ratio: 5.8 ratio units — ABNORMAL HIGH (ref 0.0–4.4)
Cholesterol, Total: 279 mg/dL — ABNORMAL HIGH (ref 100–199)
HDL: 48 mg/dL (ref >39)
LDL Calculated: 214 mg/dL — ABNORMAL HIGH (ref 0–99)
Triglycerides: 83 mg/dL (ref 0–149)
VLDL Cholesterol Cal: 17 mg/dL (ref 5–40)

## 2014-10-27 LAB — TSH: TSH: 2.7 u[IU]/mL (ref 0.450–4.500)

## 2014-11-02 ENCOUNTER — Telehealth: Payer: Self-pay

## 2014-11-02 NOTE — Telephone Encounter (Signed)
Patient's son Aaron Edelman called requesting lab results. Aaron Edelman was informed that labs was discussed with Liliane Channel and he was not on file so I could not share results with patient him. Aaron Edelman was informed that DPR needs to be signed.  Aaron Edelman requested that we mail copy of labs to patient. I called patient, left message on voicemail for patient to return call when available

## 2014-11-07 NOTE — Telephone Encounter (Signed)
Results were mailed

## 2015-01-19 ENCOUNTER — Other Ambulatory Visit: Payer: Self-pay | Admitting: Neurology

## 2015-01-19 MED ORDER — DONEPEZIL HCL 5 MG PO TABS: 5.0000 mg | ORAL_TABLET | Freq: Every day | ORAL | Status: DC

## 2015-01-19 NOTE — Telephone Encounter (Signed)
Aricept refill requested. Per last office note- patient to remain on medication. Refill approved and sent to patient's pharmacy.   

## 2015-01-24 ENCOUNTER — Non-Acute Institutional Stay: Payer: Medicare Other | Admitting: Internal Medicine

## 2015-01-24 ENCOUNTER — Encounter: Payer: Self-pay | Admitting: Internal Medicine

## 2015-01-24 VITALS — BP 122/68 | HR 76 | Temp 97.5°F

## 2015-01-24 DIAGNOSIS — R110 Nausea: Secondary | ICD-10-CM | POA: Diagnosis not present

## 2015-01-24 DIAGNOSIS — R1314 Dysphagia, pharyngoesophageal phase: Secondary | ICD-10-CM | POA: Diagnosis not present

## 2015-01-24 DIAGNOSIS — R112 Nausea with vomiting, unspecified: Secondary | ICD-10-CM

## 2015-01-24 DIAGNOSIS — G231 Progressive supranuclear ophthalmoplegia [Steele-Richardson-Olszewski]: Secondary | ICD-10-CM | POA: Diagnosis not present

## 2015-01-24 NOTE — Progress Notes (Signed)
Patient ID: Isabella George, female   DOB: [birth year], 81 y.o.   MRN: [id number]   Location:  [facility]  Code Status: full code  Goals of Care:Advanced Directive information Does patient have an advance directive?: Yes, Type of Advance Directive: Living will, Does patient want to make changes to advanced directive?: No - Patient declined  Chief Complaint  Patient presents with  . Acute Visit    off and on sweating, nausea yesterday and last night. Temp last night 96, today 98.7.  Here with 2 aids and son Aaron Edelman    HPI: Patient is a 51 y.o. white female with PSP seen in the [facility] today for an acute visit with above concerns.   Apparently, she has been nauseated since yesterday and vomited some today.  Has not been continuous.  No diarrhea.  She's had sweats but is afebrile here.  She no longer admits to nausea.  Her son tried to give her ginger beer to quell the nausea.  She's had minimal intake since yesterday, but appears surprisingly well considering. 2 caregivers are present and attest to the story--they do not appear very concerned.    Review of Systems:  Review of Systems  Constitutional: Positive for diaphoresis. Negative for fever, chills and malaise/fatigue.  HENT: Negative for congestion.   Respiratory: Positive for cough. Negative for shortness of breath.   Cardiovascular: Negative for chest pain.  Gastrointestinal: Positive for nausea and vomiting. Negative for heartburn, abdominal pain, diarrhea, blood in stool and melena.  Musculoskeletal: Negative for falls.  Neurological: Negative for weakness.       Contractures/spasticity/stiffness, wheelchair bound, help with all adls  Psychiatric/Behavioral: Positive for memory loss.    Past Medical History  Diagnosis Date  . Osteoporosis, senile   . Cancer [facility])     Breast Cancer  . Acid reflux   . Hip fracture ([place])   . Depressive disorder, not elsewhere classified 2012  . Anemia, unspecified   . Acute  posthemorrhagic anemia 06/2011  . Malignant neoplasm of breast (female), unspecified site 9    s/p left mastectomy  . Sleep disturbance, unspecified   . Abnormality of gait   . Allergic rhinitis due to pollen   . Unspecified essential hypertension   . Restless leg syndrome 08/04/2012  . Osteoarthritis of both knees   . Rhinitis, non-allergic   . Memory loss   . Osteoarthrosis, unspecified whether generalized or localized, unspecified site 12/17/2011  . Debility, unspecified 07/25/2011  . Aneurysm of unspecified site ([place]) 07/08/2011  . Unspecified venous (peripheral) insufficiency 07/08/2011  . Closed fracture of intertrochanteric section of femur ([place]) 07/07/2011  . Dizziness and giddiness 06/23/2011  . Personal history of fall 06/23/2011  . Disturbance of skin sensation 05/26/2011  . Contusion of toe 05/26/2011  . Esophagitis, unspecified 04/14/2011  . Diaphragmatic hernia without mention of obstruction or gangrene 04/14/2011  . Loss of weight 04/14/2011  . Closed fracture of two ribs 04/14/2011  . Ulcer of esophagus 02/25/2011  . Chest pain, unspecified 02/10/2011  . Unspecified hereditary and idiopathic peripheral neuropathy 02/05/2011  . Scoliosis (and kyphoscoliosis), idiopathic   . Unspecified urinary incontinence 12/27/2010  . Closed fracture of rib(s), unspecified 12/27/2010  . 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 Hyperlipidemia     Past Surgical History  Procedure Laterality Date  . Appendectomy    . Tonsillectomy    . Anal fissuer    . Anal fissure repair    .  Varicose vein surgery      Ligation of vein  . Tongue surgery    . Hand surgery    . Femur im nail  07/03/2011    Procedure: INTRAMEDULLARY (IM) NAIL FEMORAL;  Surgeon: Melina Schools, MD;  Location: [facility];  Service: Orthopedics;  Laterality: Left;  Synthes troch nail, jackson bed, c-arm  . Hip fracture surgery    . Breast surgery  1986    Left mastectomy  . Tongue surgery  2000    lump on  back of tongue Dr. Owens Shark  . Cataract extraction w/ intraocular lens implant  2000    right  . Orif acetabular fracture  07/03/2011    left hip Dr. Rolena Infante  . Esophagogastroduodenoscopy endoscopy  [id number]    Dr. Cristina Gong with biopsy gastroesophageal junction mucosa with focal ulceration no intestinal metaplasia, dysphasia, or malignancy. Does have a hiatal hernia with esophagitis  . Colonoscopy w/ biopsies  02/21/2011    polyp-hyperplastic polyp, no adenomatous change or malignancy Dr. Cristina Gong    Social History:   reports that she has never smoked. She has never used smokeless tobacco. She reports that she drinks alcohol. She reports that she does not use illicit drugs.  Allergies  Allergen Reactions  . Bee Venom Anaphylaxis  . Adhesive [Tape] Rash    Medications: Patient's Medications  New Prescriptions   ONDANSETRON (ZOFRAN ODT) 8 MG DISINTEGRATING TABLET    Take 1 tablet (8 mg total) by mouth every 8 (eight) hours as needed for nausea or vomiting.  Previous Medications   ASPIRIN 81 MG TABLET    Take 81 mg by mouth daily.   B COMPLEX VITAMINS CAPSULE    Take 1 capsule by mouth daily.   CHLORPHENIRAMINE (CHLOR-TRIMETON) 4 MG TABLET    Take 8 mg by mouth daily.    CHOLECALCIFEROL (VITAMIN D3) LIQD    Take 1,000 Units by mouth daily.    DENOSUMAB (PROLIA) 60 MG/ML SOLN INJECTION    Inject 60 mg into the skin every 6 (six) months. Administer in upper arm, thigh, or abdomen   DONEPEZIL (ARICEPT) 5 MG TABLET    Take 1 tablet (5 mg total) by mouth daily with breakfast.   EPINEPHRINE 0.3 MG/0.3 ML IJ SOAJ INJECTION    Inject 0.3 mLs (0.3 mg total) into the muscle once.   FAMOTIDINE (PEPCID AC) 10 MG CHEWABLE TABLET    Chew 10 mg by mouth 2 (two) times daily.   MUPIROCIN OINTMENT (BACTROBAN) 2 %    Apply 1 application topically 2 (two) times daily. APPLY TO AFFECTED AREAS AS NEEDED   OMEPRAZOLE (PRILOSEC) 40 MG CAPSULE    Take 40 mg by mouth 2 (two) times daily.    PROBIOTIC PRODUCT (ALIGN  PO)    Take 1 capsule by mouth daily.    TRIAMCINOLONE CREAM (KENALOG) 0.1 %    Apply 1 application topically daily as needed (rash).    VITAMIN B-12 (CYANOCOBALAMIN) 1000 MCG TABLET    Take 1,000 mcg by mouth daily.  Modified Medications   Modified Medication Previous Medication   IPRATROPIUM-ALBUTEROL (DUONEB) 0.5-2.5 (3) MG/3ML SOLN ipratropium-albuterol (DUONEB) 0.5-2.5 (3) MG/3ML SOLN      Take 3 mLs by nebulization every 6 (six) hours as needed (congestion, wheezing, coughing). Dx acute bronchitis with bronchospasm    Take 3 mLs by nebulization every 6 (six) hours as needed (congestion, wheezing, coughing).  Discontinued Medications   No medications on file     Physical Exam: Filed Vitals:  01/24/15 1703  BP: 122/68  Pulse: 76  Temp: 97.5 F (36.4 C)  TempSrc: Oral  SpO2: 91%   There is no weight on file to calculate BMI. Physical Exam  Constitutional: She appears well-nourished. No distress.  Cardiovascular: Normal rate, regular rhythm and normal heart sounds.   Pulmonary/Chest: Effort normal.  Few rhonchi anteriorly  Abdominal: Soft. Bowel sounds are normal. She exhibits no distension and no mass. There is no tenderness. There is no rebound and no guarding.  Musculoskeletal:  Wheelchair bound, contracted  Neurological: She is alert. She displays abnormal reflex. She exhibits abnormal muscle tone.  Psychiatric:  Flat affect, answers short questions seemingly appropriately, but has dementia     Labs reviewed: Basic Metabolic Panel:  Recent Labs  03/28/14 1116 10/26/14 1314 01/30/15 1233  NA 142 140 141  K 4.3 4.5 4.1  CL 104 101 105  CO2 22 21 27   GLUCOSE 89 91 124*  BUN 16 15 18   CREATININE 0.60 0.68 0.66  CALCIUM 8.7 9.0 9.3  TSH 2.180 2.700  --    Liver Function Tests:  Recent Labs  03/28/14 1116 01/30/15 1233  AST 18 20  ALT 23 19  ALKPHOS 66 62  BILITOT 0.4 0.7  PROT 6.8 7.2  ALBUMIN 3.9 4.2   No results for input(s): LIPASE, AMYLASE in  the last 8760 hours. No results for input(s): AMMONIA in the last 8760 hours. CBC:  Recent Labs  03/28/14 1116 10/26/14 1314 01/30/15 1233  WBC 5.6 4.9 9.2  NEUTROABS 2.6 2.2  --   HGB 14.4  --  14.8  HCT 41.0 42.3 44.5  MCV 89  --  92.9  PLT 220  --  223   Lipid Panel:  Recent Labs  03/28/14 1116 10/26/14 1314  CHOL 273* 279*  HDL 49 48  LDLCALC 204* 214*  TRIG 102 83  CHOLHDL 5.6* 5.8*   No results found for: HGBA1C  Procedures since last appt: speech swallow study reviewed  Patient Care Team: Gayland Curry, DO as PCP - General ([place]) Lindwood Coke, MD as Consulting Physician (Dermatology) Bennetta Laos, MD as Consulting Physician (Obstetrics and Gynecology) Clent Jacks, MD as Consulting Physician (Ophthalmology) Lennon Alstrom, MD as Consulting Physician (Neurology) Melina Schools, MD as Consulting Physician (Orthopedic Surgery) [facility] Gaynelle Arabian, MD as Consulting Physician (Orthopedic Surgery)  Assessment/Plan 1. Nausea and vomiting in adult -suspect she has a viral gastritis that will soon dissipate -encourage clear liquid diet and advance as tolerates following aspiration precautions -if she remains unable to take po or develops fever, we should be notified so she could be given IVF hydration -avoided zofran Rx due to concerns that infection will remain in pt's system if overused and that we could be missing something more serious b/c she cannot provide good history  2. PSP (progressive supranuclear palsy) ([place]) -pt with dysphagia, dementia, contractures related to this neurologic condition -requires total care at home with her son -continues on aricept at bedtime (? Benefit at this point) -had swallow study and must follow aspiration precaution with dietary modifications recommended by ST  -vomiting will increase her aspiration risk -son and caregivers were educated  3.  Dysphagia:  As above  Labs/tests  ordered:  No new Next appt:  Keep as scheduled  Garvin Ellena L. Maximillian Habibi, D.O. [facility] [address], [place] [address] Cell Phone (Mon-Fri 8am-5pm):  [phone] On Call:  [phone] & follow  prompts after 5pm & weekends Office Phone:  [phone] Office Fax:  [phone]

## 2015-01-30 ENCOUNTER — Emergency Department (HOSPITAL_COMMUNITY)
Admission: EM | Admit: 2015-01-30 | Discharge: 2015-01-30 | Disposition: A | Discharge status: Home / Self Care | Payer: Medicare Other | Attending: Emergency Medicine | Admitting: Emergency Medicine

## 2015-01-30 ENCOUNTER — Emergency Department (HOSPITAL_COMMUNITY): Payer: Medicare Other

## 2015-01-30 ENCOUNTER — Encounter (HOSPITAL_COMMUNITY): Payer: Self-pay | Admitting: Emergency Medicine

## 2015-01-30 ENCOUNTER — Telehealth: Payer: Self-pay | Admitting: *Deleted

## 2015-01-30 DIAGNOSIS — Z862 Personal history of diseases of the blood and blood-forming organs and certain disorders involving the immune mechanism: Secondary | ICD-10-CM | POA: Diagnosis not present

## 2015-01-30 DIAGNOSIS — Z853 Personal history of malignant neoplasm of breast: Secondary | ICD-10-CM | POA: Insufficient documentation

## 2015-01-30 DIAGNOSIS — J40 Bronchitis, not specified as acute or chronic: Secondary | ICD-10-CM

## 2015-01-30 DIAGNOSIS — R05 Cough: Secondary | ICD-10-CM | POA: Diagnosis present

## 2015-01-30 DIAGNOSIS — Z8781 Personal history of (healed) traumatic fracture: Secondary | ICD-10-CM | POA: Insufficient documentation

## 2015-01-30 DIAGNOSIS — Z7982 Long term (current) use of aspirin: Secondary | ICD-10-CM | POA: Diagnosis not present

## 2015-01-30 DIAGNOSIS — I10 Essential (primary) hypertension: Secondary | ICD-10-CM | POA: Diagnosis not present

## 2015-01-30 DIAGNOSIS — Z8669 Personal history of other diseases of the nervous system and sense organs: Secondary | ICD-10-CM | POA: Diagnosis not present

## 2015-01-30 DIAGNOSIS — K219 Gastro-esophageal reflux disease without esophagitis: Secondary | ICD-10-CM | POA: Diagnosis not present

## 2015-01-30 DIAGNOSIS — Z8659 Personal history of other mental and behavioral disorders: Secondary | ICD-10-CM | POA: Insufficient documentation

## 2015-01-30 DIAGNOSIS — Z79899 Other long term (current) drug therapy: Secondary | ICD-10-CM | POA: Insufficient documentation

## 2015-01-30 DIAGNOSIS — M170 Bilateral primary osteoarthritis of knee: Secondary | ICD-10-CM | POA: Insufficient documentation

## 2015-01-30 DIAGNOSIS — Z9181 History of falling: Secondary | ICD-10-CM | POA: Diagnosis not present

## 2015-01-30 DIAGNOSIS — Z8639 Personal history of other endocrine, nutritional and metabolic disease: Secondary | ICD-10-CM | POA: Diagnosis not present

## 2015-01-30 DIAGNOSIS — M810 Age-related osteoporosis without current pathological fracture: Secondary | ICD-10-CM | POA: Insufficient documentation

## 2015-01-30 DIAGNOSIS — Z87828 Personal history of other (healed) physical injury and trauma: Secondary | ICD-10-CM | POA: Diagnosis not present

## 2015-01-30 DIAGNOSIS — R059 Cough, unspecified: Secondary | ICD-10-CM

## 2015-01-30 LAB — COMPREHENSIVE METABOLIC PANEL
ALBUMIN: 4.2 g/dL (ref 3.5–5.0)
ALT: 19 U/L (ref 14–54)
ANION GAP: 9 (ref 5–15)
AST: 20 U/L (ref 15–41)
Alkaline Phosphatase: 62 U/L (ref 38–126)
BUN: 18 mg/dL (ref 6–20)
CHLORIDE: 105 mmol/L (ref 101–111)
CO2: 27 mmol/L (ref 22–32)
Calcium: 9.3 mg/dL (ref 8.9–10.3)
Creatinine, Ser: 0.66 mg/dL (ref 0.44–1.00)
GFR calc Af Amer: >60 mL/min (ref >60)
GFR calc non Af Amer: >60 mL/min (ref >60)
GLUCOSE: 124 mg/dL — AB (ref 65–99)
Potassium: 4.1 mmol/L (ref 3.5–5.1)
SODIUM: 141 mmol/L (ref 135–145)
TOTAL PROTEIN: 7.2 g/dL (ref 6.5–8.1)
Total Bilirubin: 0.7 mg/dL (ref 0.3–1.2)

## 2015-01-30 LAB — CBC
HCT: 44.5 % (ref 36.0–46.0)
Hemoglobin: 14.8 g/dL (ref 12.0–15.0)
MCH: 30.9 pg (ref 26.0–34.0)
MCHC: 33.3 g/dL (ref 30.0–36.0)
MCV: 92.9 fL (ref 78.0–100.0)
PLATELETS: 223 10*3/uL (ref 150–400)
RBC: 4.79 MIL/uL (ref 3.87–5.11)
RDW: 13.6 % (ref 11.5–15.5)
WBC: 9.2 10*3/uL (ref 4.0–10.5)

## 2015-01-30 MED ORDER — ALBUTEROL SULFATE HFA 108 (90 BASE) MCG/ACT IN AERS
2.0000 | INHALATION_SPRAY | Freq: Once | RESPIRATORY_TRACT | Status: AC
  Administered 2015-01-30: 2 via RESPIRATORY_TRACT
  Filled 2015-01-30: qty 6.7

## 2015-01-30 MED ORDER — ONDANSETRON 8 MG PO TBDP: 8.0000 mg | ORAL_TABLET | Freq: Three times a day (TID) | ORAL | Status: DC | PRN

## 2015-01-30 MED ORDER — IPRATROPIUM BROMIDE 0.02 % IN SOLN
0.5000 mg | Freq: Once | RESPIRATORY_TRACT | Status: AC
  Administered 2015-01-30: 0.5 mg via RESPIRATORY_TRACT
  Filled 2015-01-30: qty 2.5

## 2015-01-30 MED ORDER — ALBUTEROL SULFATE (2.5 MG/3ML) 0.083% IN NEBU
5.0000 mg | INHALATION_SOLUTION | Freq: Once | RESPIRATORY_TRACT | Status: AC
  Administered 2015-01-30: 5 mg via RESPIRATORY_TRACT
  Filled 2015-01-30: qty 6

## 2015-01-30 NOTE — Telephone Encounter (Signed)
Patient's son Aaron Edelman) called regarding his mother, he stated that she is coughing up green mucus, thinks she may have pneumonia or getting it. He wanted to see someone here at the office but no opening was available, he stated that he was going to take her to the emergency room or the urgent care today. Left cell number in the event that someone cancelled their appointment today.[phone])

## 2015-01-30 NOTE — ED Notes (Signed)
Per care giver/son-productive cough with thick, yellow phlegm-states stool is not a normal color-yellow

## 2015-01-30 NOTE — ED Provider Notes (Signed)
CSN: [id number]     Arrival date & time 01/30/15  1120 History   First MD Initiated Contact with Patient 01/30/15 1156     Chief Complaint  Patient presents with  . Cough     Level V caveat: Super nuclear palsy and memory loss  HPI Patient presents to the emergency department with her son who provides the majority of her information.  The patient is fed by nursing assistance and she occasionally has coughing episodes.  They report however that she coughs more over the past 24 hours.  No documented fever.  No documented vomiting.  They report that the cough is thick with some yellow phlegm.  He also reports that the stool is yellow in color.  No complaints of abdominal pain.   Past Medical History  Diagnosis Date  . Osteoporosis, senile   . Cancer [facility])     Breast Cancer  . Acid reflux   . Hip fracture ([place])   . Depressive disorder, not elsewhere classified 2012  . Anemia, unspecified   . Acute posthemorrhagic anemia 06/2011  . Malignant neoplasm of breast (female), unspecified site 84    s/p left mastectomy  . Sleep disturbance, unspecified   . Abnormality of gait   . Allergic rhinitis due to pollen   . Unspecified essential hypertension   . Restless leg syndrome 08/04/2012  . Osteoarthritis of both knees   . Rhinitis, non-allergic   . Memory loss   . Osteoarthrosis, unspecified whether generalized or localized, unspecified site 12/17/2011  . Debility, unspecified 07/25/2011  . Aneurysm of unspecified site ([place]) 07/08/2011  . Unspecified venous (peripheral) insufficiency 07/08/2011  . Closed fracture of intertrochanteric section of femur ([place]) 07/07/2011  . Dizziness and giddiness 06/23/2011  . Personal history of fall 06/23/2011  . Disturbance of skin sensation 05/26/2011  . Contusion of toe 05/26/2011  . Esophagitis, unspecified 04/14/2011  . Diaphragmatic hernia without mention of obstruction or gangrene 04/14/2011  . Loss of weight 04/14/2011  . Closed fracture of two ribs  04/14/2011  . Ulcer of esophagus 02/25/2011  . Chest pain, unspecified 02/10/2011  . Unspecified hereditary and idiopathic peripheral neuropathy 02/05/2011  . Scoliosis (and kyphoscoliosis), idiopathic   . Unspecified urinary incontinence 12/27/2010  . Closed fracture of rib(s), unspecified 12/27/2010  . 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 Hyperlipidemia    Past Surgical History  Procedure Laterality Date  . Appendectomy    . Tonsillectomy    . Anal fissuer    . Anal fissure repair    . Varicose vein surgery      Ligation of vein  . Tongue surgery    . Hand surgery    . Femur im nail  07/03/2011    Procedure: INTRAMEDULLARY (IM) NAIL FEMORAL;  Surgeon: Melina Schools, MD;  [facility]: [facility];  Service: Orthopedics;  Laterality: Left;  Synthes troch nail, jackson bed, c-arm  . Hip fracture surgery    . Breast surgery  1986    Left mastectomy  . Tongue surgery  2000    lump on back of tongue Dr. Owens Shark  . Cataract extraction w/ intraocular lens implant  2000    right  . Orif acetabular fracture  07/03/2011    left hip Dr. Rolena Infante  . Esophagogastroduodenoscopy endoscopy  [id number]    Dr. Cristina Gong with biopsy gastroesophageal junction mucosa with focal ulceration no intestinal metaplasia, dysphasia, or malignancy. Does have a hiatal hernia with esophagitis  . Colonoscopy w/  biopsies  02/21/2011    polyp-hyperplastic polyp, no adenomatous change or malignancy Dr. Cristina Gong   Family History  Problem Relation Age of Onset  . Heart disease Mother   . Heart disease Brother   . Heart attack Brother   . Cancer Father     Liver cancer  . Diabetes Paternal Aunt   . COPD Brother   . Deep vein thrombosis Brother   . Varicose Veins Brother   . Hyperlipidemia Son    Social History  Substance Use Topics  . Smoking status: Never Smoker   . Smokeless tobacco: Never Used  . Alcohol Use: 0.0 oz/week    0 Standard drinks or equivalent per week     Comment: rare   OB  History    Gravida Para Term Preterm AB TAB SAB Ectopic Multiple Living   3 2 2  1     2      Review of Systems  Unable to perform ROS: Patient nonverbal      Allergies  Bee venom and Adhesive  Home Medications   Prior to Admission medications   Medication Sig Start Date End Date Taking? Authorizing Provider  aspirin 81 MG tablet Take 81 mg by mouth daily.   Yes Historical Provider, MD  b complex vitamins capsule Take 1 capsule by mouth daily.   Yes Historical Provider, MD  chlorpheniramine (CHLOR-TRIMETON) 4 MG tablet Take 8 mg by mouth daily.    Yes Historical Provider, MD  Cholecalciferol (VITAMIN D3) LIQD Take 1,000 Units by mouth daily.    Yes Historical Provider, MD  denosumab (PROLIA) 60 MG/ML SOLN injection Inject 60 mg into the skin every 6 (six) months. Administer in upper arm, thigh, or abdomen   Yes Historical Provider, MD  donepezil (ARICEPT) 5 MG tablet Take 1 tablet (5 mg total) by mouth daily with breakfast. Patient taking differently: Take 5 mg by mouth at bedtime.  01/19/15  Yes Rebecca S Tat, DO  EPINEPHrine 0.3 mg/0.3 mL IJ SOAJ injection Inject 0.3 mLs (0.3 mg total) into the muscle once. 10/11/14  Yes Tiffany L Reed, DO  famotidine (PEPCID AC) 10 MG chewable tablet Chew 10 mg by mouth 2 (two) times daily.   Yes Historical Provider, MD  mupirocin ointment (BACTROBAN) 2 % Apply 1 application topically 2 (two) times daily. APPLY TO AFFECTED AREAS AS NEEDED Patient taking differently: Apply 1 application topically 2 (two) times daily as needed (preventing infection).  03/22/14  Yes Lauree Chandler, NP  omeprazole (PRILOSEC) 40 MG capsule Take 40 mg by mouth 2 (two) times daily.    Yes Historical Provider, MD  Probiotic Product (ALIGN PO) Take 1 capsule by mouth daily.    Yes Historical Provider, MD  triamcinolone cream (KENALOG) 0.1 % Apply 1 application topically daily as needed (rash).  04/12/13  Yes Historical Provider, MD  vitamin B-12 (CYANOCOBALAMIN) 1000 MCG  tablet Take 1,000 mcg by mouth daily.   Yes Historical Provider, MD   BP 146/69 mmHg  Pulse 93  Temp(Src) 99.2 F (37.3 C) (Rectal)  Resp 17  SpO2 95% Physical Exam  Constitutional: She appears well-developed and well-nourished. No distress.  HENT:  Head: Normocephalic and atraumatic.  Eyes: EOM are normal.  Neck: Normal range of motion.  Cardiovascular: Normal rate, regular rhythm and normal heart sounds.   Pulmonary/Chest: Effort normal and breath sounds normal.  Abdominal: Soft. She exhibits no distension. There is no tenderness.  Musculoskeletal: Normal range of motion.  Skin: Skin is warm and  dry.  Psychiatric: She has a normal mood and affect. Judgment normal.  Nursing note and vitals reviewed.   ED Course  Procedures (including critical care time) Labs Review Labs Reviewed  COMPREHENSIVE METABOLIC PANEL - Abnormal; Notable for the following:    Glucose, Bld 124 (*)    All other components within normal limits  CBC    Imaging Review Dg Chest Port 1 View  01/30/2015  CLINICAL DATA:  Productive cough, worsening last 2-3 days. EXAM: PORTABLE CHEST 1 VIEW COMPARISON:  07/03/2011 FINDINGS: Heart is borderline in size. Lungs are clear. No effusions. Severe thoracolumbar scoliosis. No acute bony abnormality. IMPRESSION: No active disease. Electronically Signed   By: Rolm Baptise M.D.   On: 01/30/2015 12:22   I have personally reviewed and evaluated these images and lab results as part of my medical decision-making.   EKG Interpretation None      MDM   Final diagnoses:  None    She reports the patient's cough improved with bronchodilators.  Patient be sent home with albuterol inhaler with spacer.  Nursing assistance and son will be educated in its use.  Primary care follow-up.  No indication for additional workup in emergency department.  Chest x-ray without pneumonia or signs of aspiration pneumonia.  Vital signs are normal.    Jola Schmidt, MD 01/30/15 1620

## 2015-01-30 NOTE — Discharge Instructions (Signed)

## 2015-01-30 NOTE — ED Notes (Signed)
X-ray at bedside

## 2015-01-31 ENCOUNTER — Telehealth: Payer: Self-pay

## 2015-01-31 ENCOUNTER — Other Ambulatory Visit: Payer: Self-pay | Admitting: Internal Medicine

## 2015-01-31 DIAGNOSIS — J209 Acute bronchitis, unspecified: Secondary | ICD-10-CM

## 2015-01-31 MED ORDER — IPRATROPIUM-ALBUTEROL 0.5-2.5 (3) MG/3ML IN SOLN: 3.0000 mL | Freq: Four times a day (QID) | RESPIRATORY_TRACT | Status: AC | PRN

## 2015-01-31 MED ORDER — IPRATROPIUM-ALBUTEROL 0.5-2.5 (3) MG/3ML IN SOLN: 3.0000 mL | Freq: Four times a day (QID) | RESPIRATORY_TRACT | Status: DC | PRN

## 2015-01-31 NOTE — Telephone Encounter (Signed)
No, that's what she has, so he will have to pay for the machine.

## 2015-01-31 NOTE — Progress Notes (Signed)
Health care director from [facility] spoke with me this am about pt's visit to ED.  Sounds like she likely had an aspiration episode.  I asked her to reach back out to her son and review the swallow study recommendations to ensure they are being followed properly at home (sounds like they are).  Also, pt has PSP and not able to properly use an inhaler at this point so neb machine will be purchased by her son and I have faxed in a prescription for duonebs q6h prn wheezing, cough, congestion to help with the bronchospasm episodes.  Aaron Edelman was also encouraged to use boost supplements mixed into pt's smoothie drinks to improve nutrition.

## 2015-01-31 NOTE — Telephone Encounter (Signed)
Pharmacy called to find out if there is another diagnosis code you could for patients nebulizer fluid her insurance refused the one you sent.    Please Advise

## 2015-02-06 NOTE — Telephone Encounter (Signed)
Left message on machine for patient to return call when available   

## 2015-02-07 NOTE — Telephone Encounter (Signed)
Left message on machine for patient to return call when available for patient to call the office. But I did speak with someone at the pharmacy at [facility] informed them that the doctor said she had no other codes to use regarding this matter.

## 2015-02-08 NOTE — Telephone Encounter (Signed)
Have called patients contact numbers and have not gotten any response to messages left but pharmacy was alerted.

## 2015-02-15 ENCOUNTER — Encounter: Payer: Self-pay | Admitting: Internal Medicine

## 2015-02-26 ENCOUNTER — Telehealth: Payer: Self-pay | Admitting: *Deleted

## 2015-02-26 MED ORDER — AMBULATORY NON FORMULARY MEDICATION: Status: DC

## 2015-02-26 NOTE — Telephone Encounter (Signed)
Isabella George, son called and stated that his mother needs a Rx for a Suction Aspirator Machine to suck out mucus. Isabella George (medical Records) spoke with Dr. Mariea Clonts and Dr. Mariea Clonts stated that she has recently saw patient and she doesn't need an appointment to go ahead and write the Rx. Rx printed and given to Dr. Mariea Clonts to review and sign.

## 2015-03-01 ENCOUNTER — Telehealth: Payer: Self-pay | Admitting: *Deleted

## 2015-03-01 NOTE — Telephone Encounter (Signed)
Insurance may pay for the chair lift (or is he talking about a hoyer lift) without me seeing her but definitely won't pay for the hosp bed without a more recent visit from me (has been about a year).  Does PCP have more recent visit?  If not, will need f/u for me to document unfortunately unless she is with hospice?

## 2015-03-01 NOTE — Telephone Encounter (Signed)
LMOM making Isabella George aware that he can call PCP to get prescriptions to take to [facility] or he can make a follow up with [place] to write them. Told him to call back with any questions.

## 2015-03-01 NOTE — Telephone Encounter (Signed)
Do you want me to write RX for these things or tell him to contact PCP since following up with you PRN?

## 2015-03-01 NOTE — Telephone Encounter (Signed)
Patient's son Aaron Edelman called wondering how he can get his mom some medical equipment such as a hospital bed and a lift.  Please advise.  [phone]

## 2015-03-02 ENCOUNTER — Telehealth: Payer: Self-pay | Admitting: *Deleted

## 2015-03-02 MED ORDER — AMBULATORY NON FORMULARY MEDICATION: Status: DC

## 2015-03-02 NOTE — Telephone Encounter (Signed)
Patient son, Aaron Edelman called and wants Rx and OV notes faxed to [facility] to get Suction Machine. Rx printed and OV notes printed. LMOM for son to give me the # to the [facility] he wants this faxed to.

## 2015-03-06 ENCOUNTER — Emergency Department (HOSPITAL_COMMUNITY)
Admission: EM | Admit: 2015-03-06 | Discharge: 2015-03-07 | Discharge status: Against Medical Advice | Payer: Medicare Other | Source: Home / Self Care

## 2015-03-06 ENCOUNTER — Encounter (HOSPITAL_COMMUNITY): Payer: Self-pay | Admitting: Emergency Medicine

## 2015-03-06 DIAGNOSIS — N390 Urinary tract infection, site not specified: Secondary | ICD-10-CM | POA: Diagnosis not present

## 2015-03-06 DIAGNOSIS — R630 Anorexia: Secondary | ICD-10-CM | POA: Insufficient documentation

## 2015-03-06 DIAGNOSIS — R509 Fever, unspecified: Secondary | ICD-10-CM | POA: Insufficient documentation

## 2015-03-06 DIAGNOSIS — R531 Weakness: Secondary | ICD-10-CM | POA: Diagnosis not present

## 2015-03-06 DIAGNOSIS — E860 Dehydration: Secondary | ICD-10-CM | POA: Insufficient documentation

## 2015-03-06 DIAGNOSIS — I10 Essential (primary) hypertension: Secondary | ICD-10-CM

## 2015-03-06 DIAGNOSIS — R05 Cough: Secondary | ICD-10-CM | POA: Insufficient documentation

## 2015-03-06 LAB — CBC
HCT: 44.5 % (ref 36.0–46.0)
Hemoglobin: 14.6 g/dL (ref 12.0–15.0)
MCH: 30.1 pg (ref 26.0–34.0)
MCHC: 32.8 g/dL (ref 30.0–36.0)
MCV: 91.8 fL (ref 78.0–100.0)
PLATELETS: 166 10*3/uL (ref 150–400)
RBC: 4.85 MIL/uL (ref 3.87–5.11)
RDW: 13.8 % (ref 11.5–15.5)
WBC: 3.6 10*3/uL — AB (ref 4.0–10.5)

## 2015-03-06 LAB — BASIC METABOLIC PANEL
Anion gap: 11 (ref 5–15)
BUN: 20 mg/dL (ref 6–20)
CALCIUM: 9.2 mg/dL (ref 8.9–10.3)
CHLORIDE: 107 mmol/L (ref 101–111)
CO2: 23 mmol/L (ref 22–32)
CREATININE: 0.63 mg/dL (ref 0.44–1.00)
GFR calc non Af Amer: >60 mL/min (ref >60)
Glucose, Bld: 104 mg/dL — ABNORMAL HIGH (ref 65–99)
Potassium: 3.7 mmol/L (ref 3.5–5.1)
SODIUM: 141 mmol/L (ref 135–145)

## 2015-03-06 LAB — CBG MONITORING, ED: GLUCOSE-CAPILLARY: 96 mg/dL (ref 65–99)

## 2015-03-06 NOTE — Telephone Encounter (Signed)
Patient son called back with fax number to [facility] Fax#: [phone] Faxed

## 2015-03-06 NOTE — ED Notes (Signed)
Patient presents for fever, cough and decreased appetite x3 days. History of cerebral palsy.

## 2015-03-07 ENCOUNTER — Emergency Department (HOSPITAL_COMMUNITY): Payer: Medicare Other

## 2015-03-07 ENCOUNTER — Encounter: Payer: BLUE CROSS/BLUE SHIELD | Admitting: Internal Medicine

## 2015-03-07 ENCOUNTER — Inpatient Hospital Stay (HOSPITAL_COMMUNITY)
Admission: EM | Admit: 2015-03-07 | Discharge: 2015-03-10 | DRG: 689 | Disposition: A | Discharge status: Home Health | Payer: Medicare Other | Attending: Internal Medicine | Admitting: Internal Medicine

## 2015-03-07 ENCOUNTER — Ambulatory Visit: Payer: Self-pay | Admitting: Internal Medicine

## 2015-03-07 ENCOUNTER — Encounter (HOSPITAL_COMMUNITY): Payer: Self-pay | Admitting: Emergency Medicine

## 2015-03-07 DIAGNOSIS — R32 Unspecified urinary incontinence: Secondary | ICD-10-CM | POA: Diagnosis present

## 2015-03-07 DIAGNOSIS — Z8249 Family history of ischemic heart disease and other diseases of the circulatory system: Secondary | ICD-10-CM

## 2015-03-07 DIAGNOSIS — R05 Cough: Secondary | ICD-10-CM

## 2015-03-07 DIAGNOSIS — Z9012 Acquired absence of left breast and nipple: Secondary | ICD-10-CM | POA: Diagnosis not present

## 2015-03-07 DIAGNOSIS — G20C Parkinsonism, unspecified: Secondary | ICD-10-CM | POA: Diagnosis present

## 2015-03-07 DIAGNOSIS — J208 Acute bronchitis due to other specified organisms: Secondary | ICD-10-CM | POA: Diagnosis present

## 2015-03-07 DIAGNOSIS — Z8673 Personal history of transient ischemic attack (TIA), and cerebral infarction without residual deficits: Secondary | ICD-10-CM

## 2015-03-07 DIAGNOSIS — G20 Parkinson's disease: Secondary | ICD-10-CM | POA: Diagnosis present

## 2015-03-07 DIAGNOSIS — F0390 Unspecified dementia without behavioral disturbance: Secondary | ICD-10-CM | POA: Diagnosis present

## 2015-03-07 DIAGNOSIS — Z833 Family history of diabetes mellitus: Secondary | ICD-10-CM | POA: Diagnosis not present

## 2015-03-07 DIAGNOSIS — Z7982 Long term (current) use of aspirin: Secondary | ICD-10-CM

## 2015-03-07 DIAGNOSIS — M810 Age-related osteoporosis without current pathological fracture: Secondary | ICD-10-CM | POA: Diagnosis present

## 2015-03-07 DIAGNOSIS — R5383 Other fatigue: Secondary | ICD-10-CM

## 2015-03-07 DIAGNOSIS — K219 Gastro-esophageal reflux disease without esophagitis: Secondary | ICD-10-CM | POA: Diagnosis present

## 2015-03-07 DIAGNOSIS — M62838 Other muscle spasm: Secondary | ICD-10-CM | POA: Diagnosis present

## 2015-03-07 DIAGNOSIS — R531 Weakness: Secondary | ICD-10-CM | POA: Diagnosis present

## 2015-03-07 DIAGNOSIS — Z681 Body mass index (BMI) 19 or less, adult: Secondary | ICD-10-CM

## 2015-03-07 DIAGNOSIS — I4581 Long QT syndrome: Secondary | ICD-10-CM | POA: Diagnosis present

## 2015-03-07 DIAGNOSIS — Z515 Encounter for palliative care: Secondary | ICD-10-CM | POA: Diagnosis present

## 2015-03-07 DIAGNOSIS — E785 Hyperlipidemia, unspecified: Secondary | ICD-10-CM | POA: Diagnosis not present

## 2015-03-07 DIAGNOSIS — D696 Thrombocytopenia, unspecified: Secondary | ICD-10-CM | POA: Diagnosis present

## 2015-03-07 DIAGNOSIS — R1310 Dysphagia, unspecified: Secondary | ICD-10-CM | POA: Diagnosis present

## 2015-03-07 DIAGNOSIS — K5900 Constipation, unspecified: Secondary | ICD-10-CM | POA: Diagnosis present

## 2015-03-07 DIAGNOSIS — Z853 Personal history of malignant neoplasm of breast: Secondary | ICD-10-CM | POA: Diagnosis not present

## 2015-03-07 DIAGNOSIS — R532 Functional quadriplegia: Secondary | ICD-10-CM | POA: Diagnosis present

## 2015-03-07 DIAGNOSIS — Z66 Do not resuscitate: Secondary | ICD-10-CM | POA: Diagnosis present

## 2015-03-07 DIAGNOSIS — N390 Urinary tract infection, site not specified: Secondary | ICD-10-CM | POA: Diagnosis present

## 2015-03-07 DIAGNOSIS — R627 Adult failure to thrive: Secondary | ICD-10-CM | POA: Diagnosis present

## 2015-03-07 DIAGNOSIS — M6249 Contracture of muscle, multiple sites: Secondary | ICD-10-CM

## 2015-03-07 DIAGNOSIS — E860 Dehydration: Secondary | ICD-10-CM | POA: Diagnosis present

## 2015-03-07 DIAGNOSIS — G9340 Encephalopathy, unspecified: Secondary | ICD-10-CM | POA: Diagnosis present

## 2015-03-07 DIAGNOSIS — R159 Full incontinence of feces: Secondary | ICD-10-CM | POA: Diagnosis present

## 2015-03-07 DIAGNOSIS — R059 Cough, unspecified: Secondary | ICD-10-CM

## 2015-03-07 DIAGNOSIS — I10 Essential (primary) hypertension: Secondary | ICD-10-CM | POA: Diagnosis present

## 2015-03-07 DIAGNOSIS — B9620 Unspecified Escherichia coli [E. coli] as the cause of diseases classified elsewhere: Secondary | ICD-10-CM | POA: Diagnosis present

## 2015-03-07 DIAGNOSIS — G231 Progressive supranuclear ophthalmoplegia [Steele-Richardson-Olszewski]: Secondary | ICD-10-CM | POA: Diagnosis present

## 2015-03-07 LAB — BASIC METABOLIC PANEL
Anion gap: 11 (ref 5–15)
BUN: 21 mg/dL — ABNORMAL HIGH (ref 6–20)
CALCIUM: 9 mg/dL (ref 8.9–10.3)
CHLORIDE: 106 mmol/L (ref 101–111)
CO2: 25 mmol/L (ref 22–32)
CREATININE: 0.68 mg/dL (ref 0.44–1.00)
GFR calc Af Amer: >60 mL/min (ref >60)
GFR calc non Af Amer: >60 mL/min (ref >60)
Glucose, Bld: 94 mg/dL (ref 65–99)
Potassium: 3.7 mmol/L (ref 3.5–5.1)
Sodium: 142 mmol/L (ref 135–145)

## 2015-03-07 LAB — CBC
HCT: 46.8 % — ABNORMAL HIGH (ref 36.0–46.0)
Hemoglobin: 15.2 g/dL — ABNORMAL HIGH (ref 12.0–15.0)
MCH: 30 pg (ref 26.0–34.0)
MCHC: 32.5 g/dL (ref 30.0–36.0)
MCV: 92.5 fL (ref 78.0–100.0)
PLATELETS: 163 10*3/uL (ref 150–400)
RBC: 5.06 MIL/uL (ref 3.87–5.11)
RDW: 13.7 % (ref 11.5–15.5)
WBC: 3 10*3/uL — ABNORMAL LOW (ref 4.0–10.5)

## 2015-03-07 LAB — URINALYSIS, ROUTINE W REFLEX MICROSCOPIC
Bilirubin Urine: NEGATIVE
Glucose, UA: NEGATIVE mg/dL
KETONES UR: 40 mg/dL — AB
Leukocytes, UA: NEGATIVE
Nitrite: POSITIVE — AB
PROTEIN: NEGATIVE mg/dL
Specific Gravity, Urine: 1.027 (ref 1.005–1.030)
pH: 5.5 (ref 5.0–8.0)

## 2015-03-07 LAB — URINE MICROSCOPIC-ADD ON

## 2015-03-07 LAB — MAGNESIUM: Magnesium: 1.9 mg/dL (ref 1.7–2.4)

## 2015-03-07 MED ORDER — EPINEPHRINE 0.3 MG/0.3ML IJ SOAJ: 0.3000 mg | Freq: Every day | INTRAMUSCULAR | Status: DC | PRN

## 2015-03-07 MED ORDER — BISACODYL 10 MG RE SUPP: 10.0000 mg | Freq: Every day | RECTAL | Status: DC | PRN

## 2015-03-07 MED ORDER — PANTOPRAZOLE SODIUM 40 MG IV SOLR
40.0000 mg | INTRAVENOUS | Status: DC
  Administered 2015-03-07 – 2015-03-09 (×3): 40 mg via INTRAVENOUS
  Filled 2015-03-07 (×3): qty 40

## 2015-03-07 MED ORDER — SODIUM CHLORIDE 0.9 % IV SOLN
INTRAVENOUS | Status: DC
  Administered 2015-03-07: 19:00:00 via INTRAVENOUS
  Filled 2015-03-07 (×2): qty 1000

## 2015-03-07 MED ORDER — ASPIRIN EC 81 MG PO TBEC
81.0000 mg | DELAYED_RELEASE_TABLET | Freq: Every day | ORAL | Status: DC
  Administered 2015-03-10: 81 mg via ORAL
  Filled 2015-03-07 (×2): qty 1

## 2015-03-07 MED ORDER — IPRATROPIUM-ALBUTEROL 0.5-2.5 (3) MG/3ML IN SOLN: 3.0000 mL | Freq: Four times a day (QID) | RESPIRATORY_TRACT | Status: DC | PRN

## 2015-03-07 MED ORDER — ACETAMINOPHEN 325 MG PO TABS: 650.0000 mg | ORAL_TABLET | Freq: Four times a day (QID) | ORAL | Status: DC | PRN

## 2015-03-07 MED ORDER — SODIUM CHLORIDE 0.9 % IV BOLUS (SEPSIS)
1000.0000 mL | Freq: Once | INTRAVENOUS | Status: AC
  Administered 2015-03-07: 1000 mL via INTRAVENOUS

## 2015-03-07 MED ORDER — DEXTROSE 5 % IV SOLN
1.0000 g | Freq: Once | INTRAVENOUS | Status: AC
  Administered 2015-03-07: 1 g via INTRAVENOUS
  Filled 2015-03-07: qty 10

## 2015-03-07 MED ORDER — ENOXAPARIN SODIUM 40 MG/0.4ML ~~LOC~~ SOLN
40.0000 mg | SUBCUTANEOUS | Status: DC
  Administered 2015-03-07 – 2015-03-09 (×3): 40 mg via SUBCUTANEOUS
  Filled 2015-03-07 (×3): qty 0.4

## 2015-03-07 MED ORDER — ACETAMINOPHEN 650 MG RE SUPP: 650.0000 mg | Freq: Four times a day (QID) | RECTAL | Status: DC | PRN

## 2015-03-07 MED ORDER — DEXTROSE 5 % IV SOLN
1.0000 g | INTRAVENOUS | Status: DC
  Administered 2015-03-08 – 2015-03-09 (×2): 1 g via INTRAVENOUS
  Filled 2015-03-07 (×3): qty 10

## 2015-03-07 NOTE — ED Notes (Signed)
Awake. Verbally responsive. A/O x4. Resp even and unlabored. No audible adventitious breath sounds noted. ABC's intact. IV saline lock patent and intact. 

## 2015-03-07 NOTE — ED Notes (Addendum)
Pt arrived via EMS with report of generalize weakness, productive cough of clear sputum and poor po intake. Fever 100.9 yesterday. No food intake in 48hrs and fld less than 7oz in 24hrs. Caregiver report pt not feeling better since last ED visit 01/2015. FULL CODE status.

## 2015-03-07 NOTE — ED Notes (Signed)
Awake. Verbally responsive. A/O x2. Resp even and unlabored. No audible adventitious breath sounds noted. ABC's intact. A. Fib on monitor. IV x2 patent and intact and infusing NS at 960ml/hr and ABT without difficulty. Family at bedside.

## 2015-03-07 NOTE — ED Notes (Addendum)
Awake. Verbally responsive. A/O x2. Resp even and unlabored. No audible adventitious breath sounds noted. ABC's intact. IV saline lock patent and intact. Family/Caregiver at bedside.

## 2015-03-07 NOTE — ED Notes (Signed)
Bed: [id number] Expected date:  Expected time:  Means of arrival:  Comments: Ems not eating

## 2015-03-07 NOTE — ED Notes (Signed)
Patient transported to X-ray 

## 2015-03-07 NOTE — ED Notes (Signed)
Attempted to call patient in the lobby. No answer.

## 2015-03-07 NOTE — ED Notes (Signed)
Awake. Verbally responsive. A/O x2. Resp even and unlabored. No audible adventitious breath sounds noted. ABC's intact. SR on monitor. IV saline lock patent and intact. Family/Caregiver at bedside.

## 2015-03-07 NOTE — ED Notes (Signed)
Resting quietly with eyes closed. Arousable. Verbally responsive. Resp even and unlabored. Occ nonproductive cough. ABC's intact. A.Fib on monitor. IV's x2 saline lock patent and intact. No adverse reaction from ABT.

## 2015-03-07 NOTE — ED Notes (Signed)
Attempted to call pt from lobby without answer.

## 2015-03-07 NOTE — H&P (Addendum)
History and Physical  Isabella George [id number] DOB: 1932/03/23 DOA: 03/07/2015  Referring physician: Dr. Theotis Burrow, [place] PCP: Hollace Kinnier, DO  Outpatient Specialists:  1. Neurology: Dr. Wells Guiles Tat  Chief Complaint: Cough, decreased oral intake, weakness and fever.  HPI: Isabella George is a 81 y.o. female , lives with son, has assistance from CNA's at home, bed bound and moves around with wheelchair assistance, PMH of long-term parkinsonism, progressive supranuclear palsy, CVA, dysphagia, dementia, bowel and bladder incontinence, GERD, HTN, presented to the [facility] ED with above complaints. Patient is nonverbal and all history obtained from patient's son/healthcare power of attorney Isabella George at bedside. Patient apparently has not been doing well for 4-5 days. She usually does not drink much liquids and over the last 4-5 days, that has significantly decreased and she has not eaten anything in the last 48 hours. He states that she does not eat when she is "tired". She has had intermittent long bouts of dry coughing spells without dyspnea. She had low-grade fever off 100.33F last night. No nausea, vomiting reported. Constipated and has not had a BM for several days. She also has been sleeping a lot lately. She was brought to the emergency department last night but due to the long wait, he took her back home but due to persistent symptoms, decided to bring her back. In the ED, evaluation shows stable vital signs, WBC 3, chest x-ray without acute cardiopulmonary disease and urine microscopy suggestive of UTI. Hospitalist admission was requested.  Review of Systems: All systems reviewed and apart from history of presenting illness, are negative.  Past Medical History  Diagnosis Date  . Osteoporosis, senile   . Cancer [facility])     Breast Cancer  . Acid reflux   . Hip fracture ([place])   . Depressive disorder, not elsewhere classified 2012  . Anemia, unspecified   .  Acute posthemorrhagic anemia 06/2011  . Malignant neoplasm of breast (female), unspecified site 69    s/p left mastectomy  . Sleep disturbance, unspecified   . Abnormality of gait   . Allergic rhinitis due to pollen   . Unspecified essential hypertension   . Restless leg syndrome 08/04/2012  . Osteoarthritis of both knees   . Rhinitis, non-allergic   . Memory loss   . Osteoarthrosis, unspecified whether generalized or localized, unspecified site 12/17/2011  . Debility, unspecified 07/25/2011  . Aneurysm of unspecified site ([place]) 07/08/2011  . Unspecified venous (peripheral) insufficiency 07/08/2011  . Closed fracture of intertrochanteric section of femur ([place]) 07/07/2011  . Dizziness and giddiness 06/23/2011  . Personal history of fall 06/23/2011  . Disturbance of skin sensation 05/26/2011  . Contusion of toe 05/26/2011  . Esophagitis, unspecified 04/14/2011  . Diaphragmatic hernia without mention of obstruction or gangrene 04/14/2011  . Loss of weight 04/14/2011  . Closed fracture of two ribs 04/14/2011  . Ulcer of esophagus 02/25/2011  . Chest pain, unspecified 02/10/2011  . Unspecified hereditary and idiopathic peripheral neuropathy 02/05/2011  . Scoliosis (and kyphoscoliosis), idiopathic   . Unspecified urinary incontinence 12/27/2010  . Closed fracture of rib(s), unspecified 12/27/2010  . 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 Hyperlipidemia    Past Surgical History  Procedure Laterality Date  . Appendectomy    . Tonsillectomy    . Anal fissuer    . Anal fissure repair    . Varicose vein surgery      Ligation of vein  .  Tongue surgery    . Hand surgery    . Femur im nail  07/03/2011    Procedure: INTRAMEDULLARY (IM) NAIL FEMORAL;  Surgeon: Melina Schools, MD;  Location: [facility] ORS;  Service: Orthopedics;  Laterality: Left;  Synthes troch nail, jackson bed, c-arm  . Hip fracture surgery    . Breast surgery  1986    Left mastectomy  . Tongue surgery  2000    lump  on back of tongue Dr. Owens Shark  . Cataract extraction w/ intraocular lens implant  2000    right  . Orif acetabular fracture  07/03/2011    left hip Dr. Rolena Infante  . Esophagogastroduodenoscopy endoscopy  [id number]    Dr. Cristina Gong with biopsy gastroesophageal junction mucosa with focal ulceration no intestinal metaplasia, dysphasia, or malignancy. Does have a hiatal hernia with esophagitis  . Colonoscopy w/ biopsies  02/21/2011    polyp-hyperplastic polyp, no adenomatous change or malignancy Dr. Cristina Gong   Social History:  reports that she has never smoked. She has never used smokeless tobacco. She reports that she drinks alcohol. She reports that she does not use illicit drugs.  rest as per history of present illness.  Allergies  Allergen Reactions  . Bee Venom Anaphylaxis  . Adhesive [Tape] Rash    Family History  Problem Relation Age of Onset  . Heart disease Mother   . Heart disease Brother   . Heart attack Brother   . Cancer Father     Liver cancer  . Diabetes Paternal Aunt   . COPD Brother   . Deep vein thrombosis Brother   . Varicose Veins Brother   . Hyperlipidemia Son     Prior to Admission medications   Medication Sig Start Date End Date Taking? Authorizing Provider  AMBULATORY NON FORMULARY MEDICATION Suction Aspirator Machine Use As Directed  Dx: G23.1 (PSP), R13.14 (Dysphagia) 03/02/15  Yes Tiffany L Reed, DO  aspirin 81 MG tablet Take 81 mg by mouth daily.   Yes Historical Provider, MD  b complex vitamins capsule Take 1 capsule by mouth daily.   Yes Historical Provider, MD  chlorpheniramine (CHLOR-TRIMETON) 4 MG tablet Take 8 mg by mouth daily.    Yes Historical Provider, MD  Cholecalciferol (VITAMIN D3) LIQD Take 1,000 Units by mouth daily.    Yes Historical Provider, MD  denosumab (PROLIA) 60 MG/ML SOLN injection Inject 60 mg into the skin every 6 (six) months. Administer in upper arm, thigh, or abdomen   Yes Historical Provider, MD  donepezil (ARICEPT) 5 MG tablet  Take 1 tablet (5 mg total) by mouth daily with breakfast. Patient taking differently: Take 5 mg by mouth at bedtime.  01/19/15  Yes Rebecca S Tat, DO  EPINEPHrine 0.3 mg/0.3 mL IJ SOAJ injection Inject 0.3 mLs (0.3 mg total) into the muscle once. Patient taking differently: Inject 0.3 mg into the muscle daily as needed (allergic reaction).  10/11/14  Yes Tiffany L Reed, DO  famotidine (PEPCID AC) 10 MG chewable tablet Chew 10 mg by mouth 2 (two) times daily as needed for heartburn.    Yes Historical Provider, MD  ipratropium-albuterol (DUONEB) 0.5-2.5 (3) MG/3ML SOLN Take 3 mLs by nebulization every 6 (six) hours as needed (congestion, wheezing, coughing). Dx acute bronchitis with bronchospasm 01/31/15  Yes Tiffany L Reed, DO  mupirocin ointment (BACTROBAN) 2 % Apply 1 application topically 2 (two) times daily. APPLY TO AFFECTED AREAS AS NEEDED Patient taking differently: Apply 1 application topically 2 (two) times daily as needed (preventing infection).  03/22/14  Yes Lauree Chandler, NP  omeprazole (PRILOSEC) 40 MG capsule Take 40 mg by mouth 2 (two) times daily.    Yes Historical Provider, MD  ondansetron (ZOFRAN ODT) 8 MG disintegrating tablet Take 1 tablet (8 mg total) by mouth every 8 (eight) hours as needed for nausea or vomiting. 01/30/15  Yes Jola Schmidt, MD  Probiotic Product (ALIGN PO) Take 1 capsule by mouth daily.    Yes Historical Provider, MD  triamcinolone cream (KENALOG) 0.1 % Apply 1 application topically daily as needed (rash).  04/12/13  Yes Historical Provider, MD  vitamin B-12 (CYANOCOBALAMIN) 1000 MCG tablet Take 1,000 mcg by mouth daily.   Yes Historical Provider, MD   Physical Exam: Filed Vitals:   03/07/15 1442 03/07/15 1500 03/07/15 1530 03/07/15 1600  BP: 124/69 151/71 146/75 110/64  Pulse: 68   104  Temp:      TempSrc:      Resp: 18 30 25 26   Height:      Weight:      SpO2: 100%   100%  Temperature maximum in the ED: 99.31F.    General exam: Moderately built and  nourished  elderly female patient, lying comfortably supine on the gurney in no obvious distress. She is generally contracted at baseline.  Head, eyes and ENT: Nontraumatic and normocephalic. Pupils equally reacting to light and accommodation. Oral mucosa -unable to examine because patient will not cooperate.  Neck: Supple. No JVD, carotid bruit or thyromegaly.  Lymphatics: No lymphadenopathy.  Respiratory system: Clear to auscultation. No increased work of breathing.  Cardiovascular system: S1 and S2 heard, RRR. No JVD, murmurs, gallops, clicks or pedal edema.  Gastrointestinal system: Abdomen is nondistended, soft and nontender. Normal bowel sounds heard. No organomegaly or masses appreciated.  Central nervous system: Eyes are closed. No eye opening or verbal response. Does not follow instructions. No focal neurological deficits.  Extremities:  Generally contracted at baseline, upper extremities >lower extremities. Peripheral pulses symmetrically felt.   Skin: No rashes or acute findings.  Musculoskeletal system: Negative exam.  Psychiatry: Unable to assess due to chronic mental status changes.   Labs on Admission:  Basic Metabolic Panel:  Recent Labs Lab 03/06/15 2017 03/07/15 1225  NA 141 142  K 3.7 3.7  CL 107 106  CO2 23 25  GLUCOSE 104* 94  BUN 20 21*  CREATININE 0.63 0.68  CALCIUM 9.2 9.0   Liver Function Tests: No results for input(s): AST, ALT, ALKPHOS, BILITOT, PROT, ALBUMIN in the last 168 hours. No results for input(s): LIPASE, AMYLASE in the last 168 hours. No results for input(s): AMMONIA in the last 168 hours. CBC:  Recent Labs Lab 03/06/15 2017 03/07/15 1225  WBC 3.6* 3.0*  HGB 14.6 15.2*  HCT 44.5 46.8*  MCV 91.8 92.5  PLT 166 163   Cardiac Enzymes: No results for input(s): CKTOTAL, CKMB, CKMBINDEX, TROPONINI in the last 168 hours.  BNP (last 3 results) No results for input(s): PROBNP in the last 8760 hours. CBG:  Recent Labs Lab  03/06/15 1956  GLUCAP 96    Radiological Exams on Admission: Dg Chest 2 View  03/07/2015  CLINICAL DATA:  Cough x 2 days; pt's family member states that pt has chronic cough which worsened 2 days ago; hx breast CA; EXAM: CHEST  2 VIEW COMPARISON:  01/30/2015. FINDINGS: Cardiomegaly. Severe thoracolumbar scoliosis convex RIGHT. LEFT mastectomy and axillary node dissection. Compressive atelectasis at the LEFT base due to elevated LEFT hemidiaphragm, but focal airspace disease not  established. No evidence for failure or effusion. IMPRESSION: No active cardiopulmonary disease.  Stable exam. Electronically Signed   By: Staci Righter M.D.   On: 03/07/2015 13:58    EKG: Independently reviewed.  sinus rhythm at 94 bpm with occasional PACs, no acute changes. QTC 523 ms.  Assessment/Plan Principal Problem:   UTI (lower urinary tract infection) Active Problems:   Acid reflux   Essential hypertension   Cough   Muscle spasticity   PSP (progressive supranuclear palsy) (HCC)   Parkinsonism (HCC)   Urinary tract infection - Continue IV Rocephin pending urine culture results.  Acute encephalopathy complicating chronic mental status changes - Likely precipitated by UTI and dehydration. Treat underlying cause and monitor closely.  Cough - No pneumonia on chest x-ray. May be acute viral bronchitis. Treat supportively. Nebulizations apparently help at home. Concern for aspiration given mental status changes and hence we'll make nothing by mouth until more alert and will obtain speech therapy consultation.  Dehydration - Gentle IV fluids  GERD - IV PPI   Essential hypertension - Controlled off of medications at home  Long-term parkinsonism/progressive supranuclear palsy/muscle spasticity - Follows with Dr. Carles Collet, neurology as outpatient. Nonambulatory at baseline and does not speak much either.  Prolonged QTC - Avoid precipitating medications. Check magnesium and try to keep potassium >4.  Follow EKG in am.  Leukopenia - Unclear etiology. Follow CBC in a.m.    DVT prophylaxis: Lovenox Code Status: DO NOT RESUSCITATE-confirmed with son Isabella George in the presence of admitting RN and patient's CNA , in the ED. Family Communication: Discussed extensively with patient's son and healthcare power of attorney Isabella George at bedside.  Disposition Plan: DC home when medically stable.   Time spent: 14 minutes  HONGALGI,ANAND, MD, FACP, FHM. [facility] Pager [phone]  If 7PM-7AM, please contact night-coverage [email or web address] Password [facility] 03/07/2015, 4:31 PM

## 2015-03-07 NOTE — ED Notes (Signed)
Awake. Verbally responsive. A/O x4. Resp even and unlabored. No audible adventitious breath sounds noted. ABC's intact. A.Fib on monitor.  IV infusing NS at 923ml/hr without difficulty.

## 2015-03-07 NOTE — ED Provider Notes (Signed)
CSN: [id number]     Arrival date & time 03/07/15  1112 History   First MD Initiated Contact with Patient 03/07/15 1319     Chief Complaint  Patient presents with  . Fatigue     (Consider location/radiation/quality/duration/timing/severity/associated sxs/prior Treatment) HPI Comments: 81 year old female with extensive past medical history including Parkinson's disease c/b PSP, dementia who presents with fatigue, excessive sleepiness, and decreased oral intake. History limited because of patient's dementia and obtained from her son. Son reports that 2 nights ago the patient had a bad coughing episode for which he gave her a nebulizer which helped. She has been progressively weaker this week with decreased oral intake because she continues to fall asleep frequently and he cannot get her to eat. He reports that her cough is productive of phlegm. She has not had any vomiting, diarrhea, or complaints of pain. She did have a fever up to 100.9 yesterday afternoon which made him worried. He brought her here for evaluation but eventually left because of the wait time. Because of his concerns for her fever and another coughing episode this morning, he brought her back For evaluation.  LEVEL 5 CAVEAT APPLIES 2/2 DEMENTIA  The history is provided by a relative.    Past Medical History  Diagnosis Date  . Osteoporosis, senile   . Cancer [facility])     Breast Cancer  . Acid reflux   . Hip fracture ([place])   . Depressive disorder, not elsewhere classified 2012  . Anemia, unspecified   . Acute posthemorrhagic anemia 06/2011  . Malignant neoplasm of breast (female), unspecified site 103    s/p left mastectomy  . Sleep disturbance, unspecified   . Abnormality of gait   . Allergic rhinitis due to pollen   . Unspecified essential hypertension   . Restless leg syndrome 08/04/2012  . Osteoarthritis of both knees   . Rhinitis, non-allergic   . Memory loss   . Osteoarthrosis, unspecified whether generalized or  localized, unspecified site 12/17/2011  . Debility, unspecified 07/25/2011  . Aneurysm of unspecified site ([place]) 07/08/2011  . Unspecified venous (peripheral) insufficiency 07/08/2011  . Closed fracture of intertrochanteric section of femur ([place]) 07/07/2011  . Dizziness and giddiness 06/23/2011  . Personal history of fall 06/23/2011  . Disturbance of skin sensation 05/26/2011  . Contusion of toe 05/26/2011  . Esophagitis, unspecified 04/14/2011  . Diaphragmatic hernia without mention of obstruction or gangrene 04/14/2011  . Loss of weight 04/14/2011  . Closed fracture of two ribs 04/14/2011  . Ulcer of esophagus 02/25/2011  . Chest pain, unspecified 02/10/2011  . Unspecified hereditary and idiopathic peripheral neuropathy 02/05/2011  . Scoliosis (and kyphoscoliosis), idiopathic   . Unspecified urinary incontinence 12/27/2010  . Closed fracture of rib(s), unspecified 12/27/2010  . 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 Hyperlipidemia    Past Surgical History  Procedure Laterality Date  . Appendectomy    . Tonsillectomy    . Anal fissuer    . Anal fissure repair    . Varicose vein surgery      Ligation of vein  . Tongue surgery    . Hand surgery    . Femur im nail  07/03/2011    Procedure: INTRAMEDULLARY (IM) NAIL FEMORAL;  Surgeon: Melina Schools, MD;  Location: [facility];  Service: Orthopedics;  Laterality: Left;  Synthes troch nail, jackson bed, c-arm  . Hip fracture surgery    . Breast surgery  1986    Left mastectomy  .  Tongue surgery  2000    lump on back of tongue Dr. Owens Shark  . Cataract extraction w/ intraocular lens implant  2000    right  . Orif acetabular fracture  07/03/2011    left hip Dr. Rolena Infante  . Esophagogastroduodenoscopy endoscopy  [id number]    Dr. Cristina Gong with biopsy gastroesophageal junction mucosa with focal ulceration no intestinal metaplasia, dysphasia, or malignancy. Does have a hiatal hernia with esophagitis  . Colonoscopy w/ biopsies  02/21/2011     polyp-hyperplastic polyp, no adenomatous change or malignancy Dr. Cristina Gong   Family History  Problem Relation Age of Onset  . Heart disease Mother   . Heart disease Brother   . Heart attack Brother   . Cancer Father     Liver cancer  . Diabetes Paternal Aunt   . COPD Brother   . Deep vein thrombosis Brother   . Varicose Veins Brother   . Hyperlipidemia Son    Social History  Substance Use Topics  . Smoking status: Never Smoker   . Smokeless tobacco: Never Used  . Alcohol Use: 0.0 oz/week    0 Standard drinks or equivalent per week     Comment: rare   OB History    Gravida Para Term Preterm AB TAB SAB Ectopic Multiple Living   3 2 2  1     2      Review of Systems  Unable to perform ROS: Dementia      Allergies  Bee venom and Adhesive  Home Medications   Prior to Admission medications   Medication Sig Start Date End Date Taking? Authorizing Provider  AMBULATORY NON FORMULARY MEDICATION Suction Aspirator Machine Use As Directed  Dx: G23.1 (PSP), R13.14 (Dysphagia) 03/02/15  Yes Tiffany L Reed, DO  aspirin 81 MG tablet Take 81 mg by mouth daily.   Yes Historical Provider, MD  b complex vitamins capsule Take 1 capsule by mouth daily.   Yes Historical Provider, MD  chlorpheniramine (CHLOR-TRIMETON) 4 MG tablet Take 8 mg by mouth daily.    Yes Historical Provider, MD  Cholecalciferol (VITAMIN D3) LIQD Take 1,000 Units by mouth daily.    Yes Historical Provider, MD  denosumab (PROLIA) 60 MG/ML SOLN injection Inject 60 mg into the skin every 6 (six) months. Administer in upper arm, thigh, or abdomen   Yes Historical Provider, MD  donepezil (ARICEPT) 5 MG tablet Take 1 tablet (5 mg total) by mouth daily with breakfast. Patient taking differently: Take 5 mg by mouth at bedtime.  01/19/15  Yes Rebecca S Tat, DO  EPINEPHrine 0.3 mg/0.3 mL IJ SOAJ injection Inject 0.3 mLs (0.3 mg total) into the muscle once. Patient taking differently: Inject 0.3 mg into the muscle daily as  needed (allergic reaction).  10/11/14  Yes Tiffany L Reed, DO  famotidine (PEPCID AC) 10 MG chewable tablet Chew 10 mg by mouth 2 (two) times daily as needed for heartburn.    Yes Historical Provider, MD  ipratropium-albuterol (DUONEB) 0.5-2.5 (3) MG/3ML SOLN Take 3 mLs by nebulization every 6 (six) hours as needed (congestion, wheezing, coughing). Dx acute bronchitis with bronchospasm 01/31/15  Yes Tiffany L Reed, DO  mupirocin ointment (BACTROBAN) 2 % Apply 1 application topically 2 (two) times daily. APPLY TO AFFECTED AREAS AS NEEDED Patient taking differently: Apply 1 application topically 2 (two) times daily as needed (preventing infection).  03/22/14  Yes Lauree Chandler, NP  omeprazole (PRILOSEC) 40 MG capsule Take 40 mg by mouth 2 (two) times daily.  Yes Historical Provider, MD  ondansetron (ZOFRAN ODT) 8 MG disintegrating tablet Take 1 tablet (8 mg total) by mouth every 8 (eight) hours as needed for nausea or vomiting. 01/30/15  Yes Jola Schmidt, MD  Probiotic Product (ALIGN PO) Take 1 capsule by mouth daily.    Yes Historical Provider, MD  triamcinolone cream (KENALOG) 0.1 % Apply 1 application topically daily as needed (rash).  04/12/13  Yes Historical Provider, MD  vitamin B-12 (CYANOCOBALAMIN) 1000 MCG tablet Take 1,000 mcg by mouth daily.   Yes Historical Provider, MD   BP 151/71 mmHg  Pulse 68  Temp(Src) 98.3 F (36.8 C) (Oral)  Resp 30  Ht 5\' 9"  (1.753 m)  Wt 130 lb (58.968 kg)  BMI 19.19 kg/m2  SpO2 100% Physical Exam  Constitutional: She appears well-developed and well-nourished. No distress.  asleep  HENT:  Head: Normocephalic and atraumatic.  dry mucous membranes  Eyes: Conjunctivae are normal. Pupils are equal, round, and reactive to light.  Neck: Neck supple.  Cardiovascular: Normal rate, regular rhythm and normal heart sounds.   No murmur heard. Pulmonary/Chest: Effort normal and breath sounds normal. No respiratory distress. She has no wheezes.  Abdominal: Soft.  Bowel sounds are normal. She exhibits no distension. There is no tenderness.  Musculoskeletal: She exhibits no edema.  Neurological:  Asleep, does not follow commands  Skin: Skin is warm and dry.  Nursing note and vitals reviewed.   ED Course  Procedures (including critical care time) Labs Review Labs Reviewed  BASIC METABOLIC PANEL - Abnormal; Notable for the following:    BUN 21 (*)    All other components within normal limits  CBC - Abnormal; Notable for the following:    WBC 3.0 (*)    Hemoglobin 15.2 (*)    HCT 46.8 (*)    All other components within normal limits  URINALYSIS, ROUTINE W REFLEX MICROSCOPIC (NOT AT [facility]) - Abnormal; Notable for the following:    Color, Urine AMBER (*)    APPearance CLOUDY (*)    Hgb urine dipstick MODERATE (*)    Ketones, ur 40 (*)    Nitrite POSITIVE (*)    All other components within normal limits  URINE MICROSCOPIC-ADD ON - Abnormal; Notable for the following:    Squamous Epithelial / LPF 6-30 (*)    Bacteria, UA MANY (*)    All other components within normal limits  URINE CULTURE  CBG MONITORING, ED    Imaging Review Dg Chest 2 View  03/07/2015  CLINICAL DATA:  Cough x 2 days; pt's family member states that pt has chronic cough which worsened 2 days ago; hx breast CA; EXAM: CHEST  2 VIEW COMPARISON:  01/30/2015. FINDINGS: Cardiomegaly. Severe thoracolumbar scoliosis convex RIGHT. LEFT mastectomy and axillary node dissection. Compressive atelectasis at the LEFT base due to elevated LEFT hemidiaphragm, but focal airspace disease not established. No evidence for failure or effusion. IMPRESSION: No active cardiopulmonary disease.  Stable exam. Electronically Signed   By: Staci Righter M.D.   On: 03/07/2015 13:58   I have personally reviewed and evaluated these lab results as part of my medical decision-making.   EKG Interpretation   Date/Time:  Wednesday March 07 2015 12:33:41 EST Ventricular Rate:  94 PR Interval:  172 QRS  Duration: 97 QT Interval:  418 QTC Calculation: 523 R Axis:   84 Text Interpretation:  Sinus rhythm Atrial premature complex Borderline  right axis deviation Borderline ST depression, anterolateral leads  Prolonged QT interval Baseline wander in  lead(s) V2 T wave flattening in  inferior leads compared to previous Confirmed by LITTLE MD, RACHEL [phone])  on 03/07/2015 1:27:25 PM      Medications  sodium chloride 0.9 % bolus 1,000 mL (0 mLs Intravenous Stopped 03/07/15 1533)  cefTRIAXone (ROCEPHIN) 1 g in dextrose 5 % 50 mL IVPB (0 g Intravenous Stopped 03/07/15 1532)    MDM   Final diagnoses:  Urinary tract infection in elderly patient  Other fatigue   PT w/ h/o dementia brought in by son for evaluation after she has become progressively more tired over the past several days with intermittent coughing spells and decreased oral intake. Exam, the patient was asleep and difficult to arouse but her son reports that this is common for her. She had dry mucous membranes but no respiratory distress or abnormal lung sounds. Vital signs unremarkable. I reviewed labs from yesterday which were unremarkable. Obtained above lab work which was stable from yesterday, WBC 3.0, creatinine normal which is reassuring against significant dehydration. Chest x-ray negative for acute process and EKG shows prolonged QT with some T-wave flattening but no obvious ischemic changes. UA shows positive nitrites and large amount of bacteria suggestive of infection. Ordered urine culture and gave the patient ceftriaxone as well as fluid bolus. The patient has intermittently woken up to ask for food but then quickly falls back asleep. I am concerned about her decline in functional status at home, likely exacerbated by her current infection. I discussed with her son the risks and benefits of admission and he elected to admit for antibiotic treatment and monitoring for improvement. Discussed w/ [facility]ad hospitalist, Dr. Algis Liming, and  pt admitted to medicine for further care.   Sharlett Iles, MD 03/08/15 [phone]

## 2015-03-08 ENCOUNTER — Inpatient Hospital Stay (HOSPITAL_COMMUNITY): Payer: Medicare Other

## 2015-03-08 DIAGNOSIS — G9340 Encephalopathy, unspecified: Secondary | ICD-10-CM

## 2015-03-08 DIAGNOSIS — E860 Dehydration: Secondary | ICD-10-CM

## 2015-03-08 LAB — CBC
HEMATOCRIT: 38.7 % (ref 36.0–46.0)
Hemoglobin: 12.6 g/dL (ref 12.0–15.0)
MCH: 30.1 pg (ref 26.0–34.0)
MCHC: 32.6 g/dL (ref 30.0–36.0)
MCV: 92.4 fL (ref 78.0–100.0)
PLATELETS: 148 10*3/uL — AB (ref 150–400)
RBC: 4.19 MIL/uL (ref 3.87–5.11)
RDW: 13.8 % (ref 11.5–15.5)
WBC: 3.6 10*3/uL — ABNORMAL LOW (ref 4.0–10.5)

## 2015-03-08 MED ORDER — SODIUM CHLORIDE 0.9 % IV SOLN
INTRAVENOUS | Status: DC
  Administered 2015-03-08: 10:00:00 via INTRAVENOUS

## 2015-03-08 MED ORDER — MAGNESIUM OXIDE 400 (241.3 MG) MG PO TABS
400.0000 mg | ORAL_TABLET | Freq: Two times a day (BID) | ORAL | Status: DC
  Administered 2015-03-09 – 2015-03-10 (×2): 400 mg via ORAL
  Filled 2015-03-08 (×2): qty 1

## 2015-03-08 MED ORDER — POTASSIUM CHLORIDE IN NACL 40-0.9 MEQ/L-% IV SOLN
INTRAVENOUS | Status: DC
  Administered 2015-03-08 – 2015-03-10 (×3): 60 mL/h via INTRAVENOUS
  Filled 2015-03-08 (×5): qty 1000

## 2015-03-08 NOTE — Clinical Documentation Improvement (Signed)
Internal Medicine  Can bedbound be further specified?   Function Quadriplegia, including suspected or known cause and/or associated condition(s)  Functional Quadriparesis, including suspected or known cause and/or associated condition(s)   Other  Clinically Undetermined  Document any associated diagnoses/conditions.   Supporting Information: H&P:"bed bound and moves around with wheelchair assistance, PMH of long-term parkinsonism, progressive supranuclear palsy, CVA, dysphagia, dementia, bowel and bladder incontinence,  General exam: extremities> Contracture at baseline, upper extremities >lower extremities" Nonambulatory at baseline"    Speech Evaluation 03/08/15:"Vision: Impaired for self-feeding Self-Feeding Abilities: Total assist    Please exercise your independent, professional judgment when responding. A specific answer is not anticipated or expected. Please update your documentation within the medical record to reflect your response to this query. Thank you  Thank You,  [place] [phone]

## 2015-03-08 NOTE — Evaluation (Addendum)
Clinical/Bedside Swallow Evaluation Patient Details  Name: Isabella George MRN: [id number] Date of Birth: [birth year]  Today's Date: 03/08/2015 Time: SLP Start Time (ACUTE ONLY): 1148 SLP Stop Time (ACUTE ONLY): 1217 SLP Time Calculation (min) (ACUTE ONLY): 29 min  Past Medical History:  Past Medical History  Diagnosis Date  . Osteoporosis, senile   . Cancer [facility])     Breast Cancer  . Acid reflux   . Hip fracture ([place])   . Depressive disorder, not elsewhere classified 2012  . Anemia, unspecified   . Acute posthemorrhagic anemia 06/2011  . Malignant neoplasm of breast (female), unspecified site 40    s/p left mastectomy  . Sleep disturbance, unspecified   . Abnormality of gait   . Allergic rhinitis due to pollen   . Unspecified essential hypertension   . Restless leg syndrome 08/04/2012  . Osteoarthritis of both knees   . Rhinitis, non-allergic   . Memory loss   . Osteoarthrosis, unspecified whether generalized or localized, unspecified site 12/17/2011  . Debility, unspecified 07/25/2011  . Aneurysm of unspecified site ([place]) 07/08/2011  . Unspecified venous (peripheral) insufficiency 07/08/2011  . Closed fracture of intertrochanteric section of femur ([place]) 07/07/2011  . Dizziness and giddiness 06/23/2011  . Personal history of fall 06/23/2011  . Disturbance of skin sensation 05/26/2011  . Contusion of toe 05/26/2011  . Esophagitis, unspecified 04/14/2011  . Diaphragmatic hernia without mention of obstruction or gangrene 04/14/2011  . Loss of weight 04/14/2011  . Closed fracture of two ribs 04/14/2011  . Ulcer of esophagus 02/25/2011  . Chest pain, unspecified 02/10/2011  . Unspecified hereditary and idiopathic peripheral neuropathy 02/05/2011  . Scoliosis (and kyphoscoliosis), idiopathic   . Unspecified urinary incontinence 12/27/2010  . Closed fracture of rib(s), unspecified 12/27/2010  . 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 Hyperlipidemia    Past Surgical  History:  Past Surgical History  Procedure Laterality Date  . Appendectomy    . Tonsillectomy    . Anal fissuer    . Anal fissure repair    . Varicose vein surgery      Ligation of vein  . Tongue surgery    . Hand surgery    . Femur im nail  07/03/2011    Procedure: INTRAMEDULLARY (IM) NAIL FEMORAL;  Surgeon: Melina Schools, MD;  Location: [facility];  Service: Orthopedics;  Laterality: Left;  Synthes troch nail, jackson bed, c-arm  . Hip fracture surgery    . Breast surgery  1986    Left mastectomy  . Tongue surgery  2000    lump on back of tongue Dr. Owens Shark  . Cataract extraction w/ intraocular lens implant  2000    right  . Orif acetabular fracture  07/03/2011    left hip Dr. Rolena Infante  . Esophagogastroduodenoscopy endoscopy  [id number]    Dr. Cristina Gong with biopsy gastroesophageal junction mucosa with focal ulceration no intestinal metaplasia, dysphasia, or malignancy. Does have a hiatal hernia with esophagitis  . Colonoscopy w/ biopsies  02/21/2011    polyp-hyperplastic polyp, no adenomatous change or malignancy Dr. Cristina Gong   HPI:  pt is an 81 yo female adm to [facility] with UTI.  PMH + for cancer - breast, acid reflux, dysphagia, memory loss, OEA, esophagitis, dysphagia, anemia, allergic rhinitis, ? Parkinsonism, ? Progressive supranuclear palsy.  PSH + for lingual surgery.  Per caregiver Andria Frames, pt with progressive dysphagia since evaluation conducted 10/2014 in xray.  Caregiver coughs with all intake and even without and  has been too sleepy to adequately consume po over the last month.     Assessment / Plan / Recommendation Clinical Impression  Pt presents with symptoms of moderate oral and profound pharyngeal dysphagia that is much worse compared to 10/2014 MBS.  Pt is grossly weak and required oral care using oral suction.  SLP only administed single tsp bolus of thickened apple juice,= delayed swallow noted with decreased laryngeal elevation, multiple swallows, throat clearing and grossly wet  voice.  Pt did not follow directions to conduct further dry swallows and cough/throat clearing was grossly weak and ineffective.  Pt likely with gross pharyngeal residuals and at least laryngeal pentration/probable aspiration.    At this time, do not recommend MBS as do not anticipate will change pt's outcomes/careplan.  Coupling clinical results today, poor tolerance of po reported at home per caregiver report with MBS result from 10/2014 - pt is high aspiration/malnutrition/dehydration risk regardless of dietary consistency.    Note pt CXR negative at this time.  SLP informed RN and caregiver of recommendations and concerns.  Will follow up next date to determine if pt showing improvement or if MBS indicated.  Recommend strict npo, oral care/moisture.      Aspiration Risk   Severe    Diet Recommendation NPO;Other (Comment) (oral moisture for comfort and hygeine)   Medication Administration: Via alternative means    Other  Recommendations Oral Care Recommendations: Oral care QID Other Recommendations: Have oral suction available   Follow up Recommendations  None    Frequency and Duration min 2x/week  1 week       Prognosis Prognosis for Safe Diet Advancement: Guarded Barriers to Reach Goals: Time post onset;Severity of deficits      Swallow Study   General Date of Onset: 03/08/15 HPI: pt is an 81 yo female adm to [facility] with UTI.  PMH + for cancer - breast, acid reflux, dysphagia, memory loss, OEA, esophagitis, dysphagia, anemia, allergic rhinitis.  PSH + for lingual surgery.  Per caregiver Andria Frames, pt with progressive dysphagia since evaluation conducted 10/2014 in xray.  Caregiver coughs with all intake and even without and has been too sleepy to adequately consume po over the last month.   Type of Study: Bedside Swallow Evaluation Diet Prior to this Study: NPO Temperature Spikes Noted: No Respiratory Status: Room air History of Recent Intubation: No Behavior/Cognition: Requires  cueing;Doesn't follow directions;Lethargic/Drowsy Oral Cavity Assessment: Dried secretions Oral Care Completed by SLP: Yes Oral Cavity - Dentition: Adequate natural dentition Vision: Impaired for self-feeding Self-Feeding Abilities: Total assist Patient Positioning: Upright in bed Baseline Vocal Quality: Low vocal intensity;Wet Volitional Cough: Weak Volitional Swallow: Unable to elicit    Oral/Motor/Sensory Function Overall Oral Motor/Sensory Function:  (gross weakness, appeared worse on right)   Ice Chips Ice chips: Not tested Other Comments: pt dislikes icechips per caregiver   Thin Liquid Thin Liquid: Not tested    Nectar Thick Nectar Thick Liquid: Impaired Presentation: Spoon Oral phase functional implications: Prolonged oral transit Pharyngeal Phase Impairments: Suspected delayed Swallow;Decreased hyoid-laryngeal movement;Multiple swallows;Wet Vocal Quality;Other (comments)   Honey Thick Honey Thick Liquid: Not tested   Puree Puree: Not tested   Solid   GO    Solid: Not tested       Claudie Fisherman, [place] [facility] SLP [phone]

## 2015-03-08 NOTE — Progress Notes (Addendum)
Initial Nutrition Assessment  DOCUMENTATION CODES:   Not applicable  INTERVENTION:  -Diet Advancement per MD  NUTRITION DIAGNOSIS:   Inadequate oral intake related to dysphagia, chronic illness as evidenced by per patient/family report.  GOAL:   Patient will meet greater than or equal to 90% of their needs  MONITOR:   PO intake, Labs, I & O's, Skin, Diet advancement  REASON FOR ASSESSMENT:   Low [place]    ASSESSMENT:   81 y.o. female , lives with son, has assistance from CNA's at home, bed bound and moves around with wheelchair assistance, PMH of long-term parkinsonism, progressive supranuclear palsy, CVA, dysphagia, dementia, bowel and bladder incontinence, GERD, HTN, presented to the [facility] ED with cough, decreased oral intake, weakness and low-grade fever. Evaluation in the ED was suggestive of dehydration and UTI. As per CNA who has known patient for approximately 2 years, patient has had a steady decline over the last 1 month with increased sleepiness, decreased interaction, intermittent persistent cough-sometimes with oral intake, decreased oral intake which now is only soups.  Spoke with pt's CNA at bedside. Per chart, pt has been eating mostly soups since her 1 month decline. CNA confirmed, though she believes pt has improved some during stay. Pt has been NPO thus far.  Nutrition-Focused physical exam completed. Findings are no fat depletion, mild muscle depletion, and moderate edema.   Considering pt's poor PO over the past month, PMH of CVA, Parkinson's, and Dysphagia, and possible dehydration/UTI, RD believes PEG would be best for nutrition at this point. SLP eval is pending. Nurse will page RD when son arrives to speak to son about possible PEG.   -Per MD, a Palliative Care consult would need to take place before any possibility of PEG, and a PEG may not be warranted with her decline in mental status and other medical hx. Will await MD workup.  Labs  and Medications reviewed.   Diet Order:  Diet NPO time specified Except for: Sips with Meds  Skin:  Reviewed, no issues  Last BM:  PTA  Height:   Ht Readings from Last 1 Encounters:  03/07/15 5\' 9"  (1.753 m)    Weight:   Wt Readings from Last 1 Encounters:  03/08/15 157 lb 8 oz (71.442 kg)    Ideal Body Weight:  65.9 kg  BMI:  Body mass index is 23.25 kg/(m^2).  Estimated Nutritional Needs:   Kcal:  1450-1800 calories  Protein:  70-85 grams  Fluid:  >/= 1.45L  EDUCATION NEEDS:   No education needs identified at this time  Satira Anis. Criselda Starke, MS, RD LDN After Hours/Weekend Pager [phone]

## 2015-03-08 NOTE — Progress Notes (Signed)
PROGRESS NOTE    Isabella George [id number] DOB: 06-Sep-1932 DOA: 03/07/2015 PCP: Hollace Kinnier, DO  Outpatient Specialists:   Neurology: Dr. Wells Guiles Tat  HPI/Brief narrative 81 y.o. female , lives with son, has assistance from CNA's at home, bed bound and moves around with wheelchair assistance, PMH of long-term parkinsonism, progressive supranuclear palsy, CVA, dysphagia, dementia, bowel and bladder incontinence, GERD, HTN, presented to the [facility] ED with cough, decreased oral intake, weakness and low-grade fever. Evaluation in the ED was suggestive of dehydration and UTI. As per CNA who has known patient for approximately 2 years, patient has had a steady decline over the last 1 month with increased sleepiness, decreased interaction, intermittent persistent cough-sometimes with oral intake, decreased oral intake which now is only soups.   Assessment/Plan:  Urinary tract infection - Continue IV Rocephin pending urine culture results.  Acute encephalopathy complicating chronic mental status changes - Likely precipitated by UTI and dehydration. Treat underlying cause and monitor closely. - Easily arousable, alert, oriented to place and person this morning. Seems to have improved.  Cough - No pneumonia on initial chest x-ray. May be acute viral bronchitis. Treat supportively. Nebulizations apparently help at home. Concern for aspiration given mental status changes and hence we'll make nothing by mouth until more alert and will obtain speech therapy consultation. - Repeat chest x-ray 12/29 to see if pneumonia showing a post-hydration.  Dehydration - Gentle IV fluids-continue for additional 24 hours.  GERD - IV PPI   Essential hypertension - Controlled off of medications at home  Long-term parkinsonism/progressive supranuclear palsy/muscle spasticity - Follows with Dr. Carles Collet, neurology as outpatient. Nonambulatory at baseline and does not speak much either.  - ?  Appropriate for palliative care consultation for goals of care. We'll discuss with son.  Prolonged QTC - Avoid precipitating medications. Check magnesium and try to keep potassium >4. Follow EKG in am.  Leukopenia - Unclear etiology. Better. Mild thrombocytopenia. Follow CBC in a.m.   DVT prophylaxis: Lovenox Code Status: DO NOT RESUSCITATE-confirmed with son Mr. Isabella George in the presence of admitting RN and patient's CNA , in the ED. Family Communication: Discussed extensively with patient's son and healthcare power of attorney Mr. Isabella George at bedside on 12/28. Discussed with patient's CNA at bedside on 12/29 Disposition Plan: DC home when medically stable.    Consultants:  None  Procedures:  None  Antibiotics:  IV Rocephin 2/28 >  Subjective: Patient sleeping but easily aroused. Awake and alert. Difficult to understand speech. Oriented to person and lace. States that she feels "okay". Denies complaints. As per CNA, mental status has improved. Patient asking for something to drink.  Objective: Filed Vitals:   03/07/15 1600 03/07/15 1716 03/07/15 2027 03/08/15 0450  BP: 110/64 132/59 116/48 109/51  Pulse: 104 83 97 99  Temp:  97.4 F (36.3 C) 97.3 F (36.3 C) 97.8 F (36.6 C)  TempSrc:  Oral Axillary Axillary  Resp: 26 20 24 20   Height:      Weight:  68.493 kg (151 lb)  71.442 kg (157 lb 8 oz)  SpO2: 100% 94% 94% 92%   No intake or output data in the 24 hours ending 03/08/15 0904 Filed Weights   03/07/15 1132 03/07/15 1716 03/08/15 0450  Weight: 58.968 kg (130 lb) 68.493 kg (151 lb) 71.442 kg (157 lb 8 oz)     Exam:  General exam: Moderately built and nourished elderly female patient, lying comfortably supine on the gurney in no obvious distress.  She is generally contracted at baseline. Respiratory system: Clear. No increased work of breathing. Cardiovascular system: S1 & S2 heard, RRR. No JVD, murmurs, gallops, clicks or pedal edema. Gastrointestinal  system: Abdomen is nondistended, soft and nontender. Normal bowel sounds heard. Central nervous system: Alert and oriented to person and place. No focal neurological deficits. Extremities: Generally contracted at baseline, upper extremities >lower extremities. Peripheral pulses symmetrically felt.    Data Reviewed: Basic Metabolic Panel:  Recent Labs Lab 03/06/15 2017 03/07/15 1225  NA 141 142  K 3.7 3.7  CL 107 106  CO2 23 25  GLUCOSE 104* 94  BUN 20 21*  CREATININE 0.63 0.68  CALCIUM 9.2 9.0  MG  --  1.9   Liver Function Tests: No results for input(s): AST, ALT, ALKPHOS, BILITOT, PROT, ALBUMIN in the last 168 hours. No results for input(s): LIPASE, AMYLASE in the last 168 hours. No results for input(s): AMMONIA in the last 168 hours. CBC:  Recent Labs Lab 03/06/15 2017 03/07/15 1225 03/08/15 0430  WBC 3.6* 3.0* 3.6*  HGB 14.6 15.2* 12.6  HCT 44.5 46.8* 38.7  MCV 91.8 92.5 92.4  PLT 166 163 148*   Cardiac Enzymes: No results for input(s): CKTOTAL, CKMB, CKMBINDEX, TROPONINI in the last 168 hours. BNP (last 3 results) No results for input(s): PROBNP in the last 8760 hours. CBG:  Recent Labs Lab 03/06/15 1956  GLUCAP 96    No results found for this or any previous visit (from the past 240 hour(s)).       Studies: Dg Chest 2 View  03/07/2015  CLINICAL DATA:  Cough x 2 days; pt's family member states that pt has chronic cough which worsened 2 days ago; hx breast CA; EXAM: CHEST  2 VIEW COMPARISON:  01/30/2015. FINDINGS: Cardiomegaly. Severe thoracolumbar scoliosis convex RIGHT. LEFT mastectomy and axillary node dissection. Compressive atelectasis at the LEFT base due to elevated LEFT hemidiaphragm, but focal airspace disease not established. No evidence for failure or effusion. IMPRESSION: No active cardiopulmonary disease.  Stable exam. Electronically Signed   By: Staci Righter M.D.   On: 03/07/2015 13:58        Scheduled Meds: . aspirin EC  81 mg  Oral Daily  . cefTRIAXone (ROCEPHIN)  IV  1 g Intravenous Q24H  . enoxaparin (LOVENOX) injection  40 mg Subcutaneous Q24H  . pantoprazole (PROTONIX) IV  40 mg Intravenous Q24H   Continuous Infusions: . sodium chloride 0.9 % 1,000 mL with potassium chloride 40 mEq infusion 75 mL/hr at 03/07/15 1835    Principal Problem:   UTI (lower urinary tract infection) Active Problems:   Acid reflux   Essential hypertension   Cough   Muscle spasticity   PSP (progressive supranuclear palsy) (HCC)   Parkinsonism (HCC)    Time spent: 35 minutes.    Vernell Leep, MD, FACP, FHM. [facility] Pager [phone]  If 7PM-7AM, please contact night-coverage [email or web address] Password [email or web address] 03/08/2015, 9:04 AM    LOS: 1 day

## 2015-03-09 ENCOUNTER — Telehealth: Payer: Self-pay

## 2015-03-09 DIAGNOSIS — R1310 Dysphagia, unspecified: Secondary | ICD-10-CM | POA: Insufficient documentation

## 2015-03-09 DIAGNOSIS — G9340 Encephalopathy, unspecified: Secondary | ICD-10-CM | POA: Insufficient documentation

## 2015-03-09 DIAGNOSIS — Z515 Encounter for palliative care: Secondary | ICD-10-CM

## 2015-03-09 DIAGNOSIS — E785 Hyperlipidemia, unspecified: Secondary | ICD-10-CM

## 2015-03-09 DIAGNOSIS — Z66 Do not resuscitate: Secondary | ICD-10-CM

## 2015-03-09 LAB — BASIC METABOLIC PANEL
ANION GAP: 12 (ref 5–15)
BUN: 10 mg/dL (ref 6–20)
CHLORIDE: 109 mmol/L (ref 101–111)
CO2: 19 mmol/L — AB (ref 22–32)
Calcium: 8.3 mg/dL — ABNORMAL LOW (ref 8.9–10.3)
Creatinine, Ser: 0.52 mg/dL (ref 0.44–1.00)
GFR calc non Af Amer: >60 mL/min (ref >60)
Glucose, Bld: 65 mg/dL (ref 65–99)
POTASSIUM: 4.4 mmol/L (ref 3.5–5.1)
SODIUM: 140 mmol/L (ref 135–145)

## 2015-03-09 LAB — CBC
HEMATOCRIT: 40.1 % (ref 36.0–46.0)
HEMOGLOBIN: 13 g/dL (ref 12.0–15.0)
MCH: 30.4 pg (ref 26.0–34.0)
MCHC: 32.4 g/dL (ref 30.0–36.0)
MCV: 93.9 fL (ref 78.0–100.0)
Platelets: 158 10*3/uL (ref 150–400)
RBC: 4.27 MIL/uL (ref 3.87–5.11)
RDW: 13.8 % (ref 11.5–15.5)
WBC: 3.9 10*3/uL — AB (ref 4.0–10.5)

## 2015-03-09 LAB — GLUCOSE, CAPILLARY
GLUCOSE-CAPILLARY: 60 mg/dL — AB (ref 65–99)
GLUCOSE-CAPILLARY: 81 mg/dL (ref 65–99)

## 2015-03-09 LAB — MAGNESIUM: Magnesium: 1.7 mg/dL (ref 1.7–2.4)

## 2015-03-09 NOTE — Progress Notes (Signed)
PROGRESS NOTE    Isabella George [id number] DOB: 11/27/1932 DOA: 03/07/2015 PCP: Hollace Kinnier, DO  Outpatient Specialists:   Neurology: Dr. Wells Guiles Tat  HPI/Brief narrative 81 y.o. female , lives with son, has assistance from CNA's at home, bed bound and moves around with wheelchair assistance, PMH of long-term parkinsonism, progressive supranuclear palsy, CVA, dysphagia, dementia, bowel and bladder incontinence, GERD, HTN, presented to the [facility] ED with cough, decreased oral intake, weakness and low-grade fever. Evaluation in the ED was suggestive of dehydration and UTI. As per CNA who has known patient for approximately 2 years, patient has had a steady decline over the last 1 month with increased sleepiness, decreased interaction, intermittent persistent cough-sometimes with oral intake, decreased oral intake which now is only soups.   Assessment/Plan:  Urinary tract infection - Continue IV Rocephin pending urine culture results. Urine culture results are still pending.  Acute encephalopathy complicating chronic mental status changes - Likely precipitated by UTI and dehydration. Treat underlying cause and monitor closely. - Mental status has definitely improved since admission. As per CNA at bedside, mental status may be close to her baseline.  Cough - No pneumonia on initial or follow-up chest x-ray. May be acute viral bronchitis. Treat supportively. Nebulizations apparently help at home.  - Seems to have improved.  Dehydration - Gentle IV fluids-continue for additional 24 hours.  GERD - IV PPI   Essential hypertension - Controlled off of medications at home  Long-term parkinsonism/progressive supranuclear palsy/muscle spasticity with functional quadriplegia - Follows with Dr. Carles Collet, neurology as outpatient. Nonambulatory at baseline and does not speak much either.  - Palliative care team consulted after discussing with patient's son  Prolonged QTC -  Avoid precipitating medications. Resolved as per RN report on 12/29 (EKG from yesterday does not seem to have been scanned into Epic)  Leukopenia - Unclear etiology. Improved. Thrombocytopenia resolved  Adult failure to thrive - Palliative care was consulted after discussing with patient's healthcare power of attorney. Discussed with Tiffany Kocher of care team: Treat the treatable to prolong life at this time, no artificial feeding now or in the future, comfort feeds as tolerated with family being aware of risks of aspiration, DC home when stable with home health services and 24/7 caregivers and son is aware to trigger hospice   DVT prophylaxis: Lovenox Code Status: DO NOT RESUSCITATE-confirmed with son Mr. Sharis Fesperman in the presence of admitting RN and patient's CNA , in the ED. Family Communication: Discussed with patient's CNA at bedside. Had discussed with patient's son on 12/29. Disposition Plan: DC home possibly 12/31.    Consultants:  Palliative care team.  Procedures:  None  Antibiotics:  IV Rocephin 2/28 >  Subjective: "I'm fine". Asking for orange juice to drink. As per CNA at bedside, mental status has improved and almost back to baseline.  Objective: Filed Vitals:   03/08/15 1333 03/08/15 2210 03/09/15 0615 03/09/15 1327  BP: 119/65 144/62 127/76 108/53  Pulse: 110 81 98 96  Temp: 98 F (36.7 C) 97.3 F (36.3 C) 98.5 F (36.9 C) 99 F (37.2 C)  TempSrc: Core (Comment) Oral Oral Axillary  Resp: 18 16 16 20   Height:      Weight:      SpO2: 93% 98% 95% 98%   No intake or output data in the 24 hours ending 03/09/15 1600 Filed Weights   03/07/15 1132 03/07/15 1716 03/08/15 0450  Weight: 58.968 kg (130 lb) 68.493 kg (151 lb) 71.442 kg (157 lb  8 oz)     Exam:  General exam: Moderately built and nourished elderly female patient, lying comfortably supine on the gurney in no obvious distress. She is generally contracted at baseline. Respiratory system: Clear. No  increased work of breathing. Cardiovascular system: S1 & S2 heard, RRR. No JVD, murmurs, gallops, clicks or pedal edema. Gastrointestinal system: Abdomen is nondistended, soft and nontender. Normal bowel sounds heard. Central nervous system: Alert and oriented to person and place. Able to answer simple questions. No focal neurological deficits. Extremities: Generally contracted at baseline, upper extremities >lower extremities. Peripheral pulses symmetrically felt.    Data Reviewed: Basic Metabolic Panel:  Recent Labs Lab 03/06/15 2017 03/07/15 1225 03/09/15 0410  NA 141 142 140  K 3.7 3.7 4.4  CL 107 106 109  CO2 23 25 19*  GLUCOSE 104* 94 65  BUN 20 21* 10  CREATININE 0.63 0.68 0.52  CALCIUM 9.2 9.0 8.3*  MG  --  1.9 1.7   Liver Function Tests: No results for input(s): AST, ALT, ALKPHOS, BILITOT, PROT, ALBUMIN in the last 168 hours. No results for input(s): LIPASE, AMYLASE in the last 168 hours. No results for input(s): AMMONIA in the last 168 hours. CBC:  Recent Labs Lab 03/06/15 2017 03/07/15 1225 03/08/15 0430 03/09/15 0410  WBC 3.6* 3.0* 3.6* 3.9*  HGB 14.6 15.2* 12.6 13.0  HCT 44.5 46.8* 38.7 40.1  MCV 91.8 92.5 92.4 93.9  PLT 166 163 148* 158   Cardiac Enzymes: No results for input(s): CKTOTAL, CKMB, CKMBINDEX, TROPONINI in the last 168 hours. BNP (last 3 results) No results for input(s): PROBNP in the last 8760 hours. CBG:  Recent Labs Lab 03/06/15 1956  GLUCAP 96    Recent Results (from the past 240 hour(s))  Urine culture     Status: None (Preliminary result)   Collection Time: 03/07/15  3:34 PM  Result Value Ref Range Status   Specimen Description URINE, CATHETERIZED  Final   Special Requests Normal  Final   Culture   Final    CULTURE REINCUBATED FOR BETTER GROWTH Performed at [facility]    Report Status PENDING  Incomplete         Studies: Dg Chest Port 1 View  03/08/2015  CLINICAL DATA:  Cough. EXAM: PORTABLE CHEST 1  VIEW COMPARISON:  March 07, 2015. FINDINGS: Stable cardiomegaly. No pneumothorax or significant pleural effusion is noted. Left axillary surgical clips are noted. Stable dextroscoliosis of lower thoracic spine is noted. No acute pulmonary disease is noted. IMPRESSION: No acute cardiopulmonary abnormality seen. Electronically Signed   By: Marijo Conception, M.D.   On: 03/08/2015 09:59        Scheduled Meds: . aspirin EC  81 mg Oral Daily  . cefTRIAXone (ROCEPHIN)  IV  1 g Intravenous Q24H  . enoxaparin (LOVENOX) injection  40 mg Subcutaneous Q24H  . magnesium oxide  400 mg Oral BID  . pantoprazole (PROTONIX) IV  40 mg Intravenous Q24H   Continuous Infusions: . 0.9 % NaCl with KCl 40 mEq / L 60 mL/hr (03/08/15 2145)    Principal Problem:   UTI (lower urinary tract infection) Active Problems:   Acid reflux   Essential hypertension   Cough   Muscle spasticity   PSP (progressive supranuclear palsy) (HCC)   Parkinsonism ([place])   Palliative care encounter   DNR (do not resuscitate)   Dysphagia    Time spent: 15 minutes.    Vernell Leep, MD, FACP, FHM. [facility] Pager [phone]  If 7PM-7AM, please contact night-coverage [email or web address] Password [email or web address] 03/09/2015, 4:00 PM    LOS: 2 days

## 2015-03-09 NOTE — Progress Notes (Signed)
caregiver at bedside asked to wait for the pts dinner tray (with pudding) before trying to correct low blood sugar with juice or D50 IV. Pt is asymptomatic. Will continue to monitor an report this to night shift RN.

## 2015-03-09 NOTE — Telephone Encounter (Signed)
Left message on voicemail for patient's son to return call when available. Reason for call: Dr.Reed wrote rx for [employer] (placed at the front in file folder under the S's), question if patient would like RX faxed, mailed, or pick-up.

## 2015-03-09 NOTE — Consult Note (Signed)
Consultation Note Date: 03/09/2015   Patient Name: Isabella George  DOB: 1932-10-25  MRN: [id number]  Age / Sex: 81 y.o., female  PCP: Gayland Curry, DO Referring Physician: Modena Jansky, MD  Reason for Consultation: Establishing goals of care    Clinical Assessment/Narrative:   81 y.o. female , lives with son, has assistance from CNA's at home, bed bound and moves around with wheelchair assistance, PMH of long-term parkinsonism, progressive supranuclear palsy, CVA, dysphagia, dementia, bowel and bladder incontinence, GERD, HTN, presented to the [facility] ED with above complaints. Admitted with poo po intake, cough and fever   In the ED, evaluation shows stable vital signs, WBC 3, chest x-ray without acute cardiopulmonary disease and urine microscopy suggestive of UTI.  Marland Kitchen   Patient is nonverbal and all history obtained from patient's son/healthcare power of attorney Mr.Brian Fetting at bedside. Continued physical, function decline per family over the past three years.  Her son tells me regardless of the situation  "she is a happy person"  Family is faced with advanced directive and anticipatory care needs.  This NP Wadie Lessen reviewed medical records, received report from team, assessed the patient and then meet at the patient's bedside along with her son/ Isabella George to discuss diagnosis ( terminal disease of SNP and overall failure to thrive)  prognosis, GOC, EOL wishes disposition and options.  A detailed discussion was had today regarding advanced directives.  Concepts specific to code status, artifical feeding and hydration, continued IV antibiotics and rehospitalization was had.  The difference between a aggressive medical intervention path  and a palliative comfort care path for this patient at this time was had.  Values and goals of care important to patient and family were attempted to be  elicited.  Concept of Hospice and Palliative Care were discussed.  Natural trajectory and expectations at EOL were discussed.  Questions and concerns addressed.  Hard Choices booklet left for review. Family encouraged to call with questions or concerns.  PMT will continue to support holistically.   Primary Decision Maker: sons/ Isabella George primary    HCPOA: yes    SUMMARY OF RECOMMENDATIONS  - treat the treatable to prolong life at this time - no artificial feeding now or in the future, comfort feeds as tolerated with known risk of aspiration, Isabella George verbalizes understanding of the risks but "wants my mother to eat" -continue treatment for UTI, transition to oral agents/solution and discharge home with home health and current care plan of 24/7 caregivers, anticipates discharge tomorrow -son will trigger hospice services when he thinks "its time"  Code Status/Advance Care Planning: DNR    Code Status Orders        Start     Ordered   03/07/15 1715  Do not attempt resuscitation (DNR)   Continuous    Question Answer Comment  In the event of cardiac or respiratory ARREST Do not call a "code blue"   In the event of cardiac or respiratory ARREST Do not perform Intubation, CPR, defibrillation or ACLS   In the event of cardiac or respiratory ARREST Use medication by any route, position, wound care, and other measures to relive pain and suffering. May use oxygen, suction and manual treatment of airway obstruction as needed for comfort.      03/07/15 1714      Other Directives:Advanced Directive and Living Will  Symptom Management:   Dysphagia: comfort feeds/ aspiration precautions as tolerated  with known risk of aspiration, family accepts the risk  Additional Recommendations (Limitations, Scope, Preferences):  Avoid Hospitalization, Initiate Comfort Feeding and No Artificial Feeding   Psycho-social/Spiritual:  Support System: Strong Desire for further Chaplaincy  support:no Additional Recommendations: Education on Hospice  Prognosis:   Less than 6 months  Discharge Planning: Home with Home Health   Chief Complaint/ Primary Diagnoses: Present on Admission:  . UTI (lower urinary tract infection) . Cough . Acid reflux . Muscle spasticity . Essential hypertension . PSP (progressive supranuclear palsy) ([place]) . Parkinsonism ([place])  I have reviewed the medical record, interviewed the patient and family, and examined the patient. The following aspects are pertinent.  Past Medical History  Diagnosis Date  . Osteoporosis, senile   . Cancer [facility])     Breast Cancer  . Acid reflux   . Hip fracture ([place])   . Depressive disorder, not elsewhere classified 2012  . Anemia, unspecified   . Acute posthemorrhagic anemia 06/2011  . Malignant neoplasm of breast (female), unspecified site 65    s/p left mastectomy  . Sleep disturbance, unspecified   . Abnormality of gait   . Allergic rhinitis due to pollen   . Unspecified essential hypertension   . Restless leg syndrome 08/04/2012  . Osteoarthritis of both knees   . Rhinitis, non-allergic   . Memory loss   . Osteoarthrosis, unspecified whether generalized or localized, unspecified site 12/17/2011  . Debility, unspecified 07/25/2011  . Aneurysm of unspecified site ([place]) 07/08/2011  . Unspecified venous (peripheral) insufficiency 07/08/2011  . Closed fracture of intertrochanteric section of femur ([place]) 07/07/2011  . Dizziness and giddiness 06/23/2011  . Personal history of fall 06/23/2011  . Disturbance of skin sensation 05/26/2011  . Contusion of toe 05/26/2011  . Esophagitis, unspecified 04/14/2011  . Diaphragmatic hernia without mention of obstruction or gangrene 04/14/2011  . Loss of weight 04/14/2011  . Closed fracture of two ribs 04/14/2011  . Ulcer of esophagus 02/25/2011  . Chest pain, unspecified 02/10/2011  . Unspecified hereditary and idiopathic peripheral neuropathy 02/05/2011  . Scoliosis (and  kyphoscoliosis), idiopathic   . Unspecified urinary incontinence 12/27/2010  . Closed fracture of rib(s), unspecified 12/27/2010  . 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 Hyperlipidemia    Social History   Social History  . Marital Status: Widowed    Spouse Name: N/A  . Number of Children: N/A  . Years of Education: N/A   Social History Main Topics  . Smoking status: Never Smoker   . Smokeless tobacco: Never Used  . Alcohol Use: 0.0 oz/week    0 Standard drinks or equivalent per week     Comment: rare  . Drug Use: No  . Sexual Activity: No   Other Topics Concern  . None   Social History Narrative   Lives off [address]   Patient is widowed.    Patient has 2 children.   Patient has 3 years or college.   Patient is retired.    Never smoked   Alcohol none   Exercise none         Family History  Problem Relation Age of Onset  . Heart disease Mother   . Heart disease Brother   . Heart attack Brother   . Cancer Father     Liver cancer  . Diabetes Paternal Aunt   . COPD Brother   . Deep vein thrombosis Brother   . Varicose Veins Brother   . Hyperlipidemia Son    Scheduled Meds: . aspirin EC  81 mg Oral Daily  . cefTRIAXone (ROCEPHIN)  IV  1 g Intravenous Q24H  . enoxaparin (LOVENOX) injection  40 mg Subcutaneous Q24H  . magnesium oxide  400 mg Oral BID  . pantoprazole (PROTONIX) IV  40 mg Intravenous Q24H   Continuous Infusions: . 0.9 % NaCl with KCl 40 mEq / L 60 mL/hr (03/08/15 2145)   PRN Meds:.acetaminophen **OR** acetaminophen, bisacodyl, EPINEPHrine, ipratropium-albuterol Medications Prior to Admission:  Prior to Admission medications   Medication Sig Start Date End Date Taking? Authorizing Provider  AMBULATORY NON FORMULARY MEDICATION Suction Aspirator Machine Use As Directed  Dx: G23.1 (PSP), R13.14 (Dysphagia) 03/02/15  Yes Tiffany L Reed, DO  aspirin 81 MG tablet Take 81 mg by mouth daily.   Yes Historical  Provider, MD  b complex vitamins capsule Take 1 capsule by mouth daily.   Yes Historical Provider, MD  chlorpheniramine (CHLOR-TRIMETON) 4 MG tablet Take 8 mg by mouth daily.    Yes Historical Provider, MD  Cholecalciferol (VITAMIN D3) LIQD Take 1,000 Units by mouth daily.    Yes Historical Provider, MD  denosumab (PROLIA) 60 MG/ML SOLN injection Inject 60 mg into the skin every 6 (six) months. Administer in upper arm, thigh, or abdomen   Yes Historical Provider, MD  donepezil (ARICEPT) 5 MG tablet Take 1 tablet (5 mg total) by mouth daily with breakfast. Patient taking differently: Take 5 mg by mouth at bedtime.  01/19/15  Yes Rebecca S Tat, DO  EPINEPHrine 0.3 mg/0.3 mL IJ SOAJ injection Inject 0.3 mLs (0.3 mg total) into the muscle once. Patient taking differently: Inject 0.3 mg into the muscle daily as needed (allergic reaction).  10/11/14  Yes Tiffany L Reed, DO  famotidine (PEPCID AC) 10 MG chewable tablet Chew 10 mg by mouth 2 (two) times daily as needed for heartburn.    Yes Historical Provider, MD  ipratropium-albuterol (DUONEB) 0.5-2.5 (3) MG/3ML SOLN Take 3 mLs by nebulization every 6 (six) hours as needed (congestion, wheezing, coughing). Dx acute bronchitis with bronchospasm 01/31/15  Yes Tiffany L Reed, DO  mupirocin ointment (BACTROBAN) 2 % Apply 1 application topically 2 (two) times daily. APPLY TO AFFECTED AREAS AS NEEDED Patient taking differently: Apply 1 application topically 2 (two) times daily as needed (preventing infection).  03/22/14  Yes Lauree Chandler, NP  omeprazole (PRILOSEC) 40 MG capsule Take 40 mg by mouth 2 (two) times daily.    Yes Historical Provider, MD  ondansetron (ZOFRAN ODT) 8 MG disintegrating tablet Take 1 tablet (8 mg total) by mouth every 8 (eight) hours as needed for nausea or vomiting. 01/30/15  Yes Jola Schmidt, MD  Probiotic Product (ALIGN PO) Take 1 capsule by mouth daily.    Yes Historical Provider, MD  triamcinolone cream (KENALOG) 0.1 % Apply 1  application topically daily as needed (rash).  04/12/13  Yes Historical Provider, MD  vitamin B-12 (CYANOCOBALAMIN) 1000 MCG tablet Take 1,000 mcg by mouth daily.   Yes Historical Provider, MD   Allergies  Allergen Reactions  . Bee Venom Anaphylaxis  . Adhesive [Tape] Rash    Review of Systems  Unable to perform ROS   Physical Exam  Constitutional: She appears lethargic. She appears cachectic. She appears ill.  HENT:  Mouth/Throat: Mucous membranes are normal.  Cardiovascular: Normal rate, regular rhythm and normal heart sounds.   Respiratory: She has decreased breath sounds in the right lower field and the left lower field.  GI: Soft. Normal appearance.  Musculoskeletal:  Right shoulder: She exhibits decreased strength.  Neurological: She appears lethargic.  Skin: Skin is warm and dry.    Vital Signs: BP 127/76 mmHg  Pulse 98  Temp(Src) 98.5 F (36.9 C) (Oral)  Resp 16  Ht 5\' 9"  (1.753 m)  Wt 71.442 kg (157 lb 8 oz)  BMI 23.25 kg/m2  SpO2 95%  SpO2: SpO2: 95 % O2 Device:SpO2: 95 % O2 Flow Rate: .   IO: Intake/output summary: No intake or output data in the 24 hours ending 03/09/15 1157  LBM: Last BM Date:  (pta) Baseline Weight: Weight: 58.968 kg (130 lb) Most recent weight: Weight: 71.442 kg (157 lb 8 oz)      Palliative Assessment/Data:    Additional Data Reviewed:  CBC:    Component Value Date/Time   WBC 3.9* 03/09/2015 0410   WBC 4.9 10/26/2014 1314   HGB 13.0 03/09/2015 0410   HCT 40.1 03/09/2015 0410   HCT 42.3 10/26/2014 1314   PLT 158 03/09/2015 0410   MCV 93.9 03/09/2015 0410   NEUTROABS 2.2 10/26/2014 1314   NEUTROABS 9.1* 07/03/2011 1150   LYMPHSABS 1.9 10/26/2014 1314   LYMPHSABS 1.1 07/03/2011 1150   MONOABS 0.7 07/03/2011 1150   EOSABS 0.2 03/28/2014 1116   EOSABS 0.0 07/03/2011 1150   BASOSABS 0.1 10/26/2014 1314   BASOSABS 0.0 07/03/2011 1150   Comprehensive Metabolic Panel:    Component Value Date/Time   NA 140 03/09/2015  0410   NA 140 10/26/2014 1314   K 4.4 03/09/2015 0410   CL 109 03/09/2015 0410   CO2 19* 03/09/2015 0410   BUN 10 03/09/2015 0410   BUN 15 10/26/2014 1314   CREATININE 0.52 03/09/2015 0410   CREATININE 1.10 02/03/2011 1533   GLUCOSE 65 03/09/2015 0410   GLUCOSE 91 10/26/2014 1314   CALCIUM 8.3* 03/09/2015 0410   AST 20 01/30/2015 1233   ALT 19 01/30/2015 1233   ALKPHOS 62 01/30/2015 1233   BILITOT 0.7 01/30/2015 1233   PROT 7.2 01/30/2015 1233   PROT 6.8 03/28/2014 1116   ALBUMIN 4.2 01/30/2015 1233   ALBUMIN 3.9 03/28/2014 1116   Discussed with Dr Exie Parody  Time In: 1130 Time Out: 1300 Time Total: 90 min Greater than 50%  of this time was spent counseling and coordinating care related to the above assessment and plan.  Signed by: Wadie Lessen, NP  Knox Royalty, NP  03/09/2015, 11:57 AM  Please contact Palliative Medicine Team phone at [phone] for questions and concerns.

## 2015-03-09 NOTE — Progress Notes (Signed)
SLP Cancellation Note  Patient Details Name: Isabella George MRN: [id number] DOB: [birth year]   Cancelled treatment:       Reason Eval/Treat Not Completed: Other (comment) (note palliative meeting planned today, will follow up to determine if slp input indicated, thanks)   Luanna Salk, [facility] SLP [phone]

## 2015-03-10 DIAGNOSIS — Z66 Do not resuscitate: Secondary | ICD-10-CM

## 2015-03-10 MED ORDER — CEFUROXIME AXETIL 250 MG PO TABS: 250.0000 mg | ORAL_TABLET | Freq: Two times a day (BID) | ORAL | Status: DC

## 2015-03-10 MED ORDER — DONEPEZIL HCL 5 MG PO TABS: 5.0000 mg | ORAL_TABLET | Freq: Every day | ORAL | Status: DC

## 2015-03-10 NOTE — Discharge Instructions (Signed)
Dysphagia Diet Level 1, Pureed The dysphasia level 1 diet includes foods that are completely pureed and smooth. The foods have a pudding-like texture, such as the texture of pureed pancakes, mashed potatoes, and yogurt. The diet does not include foods with lumps or coarse textures. Liquids should be smooth and may either be thin, nectar-thick, honey-like, or spoon-thick. This diet is helpful for people with moderate to severe swallowing problems. It reduces the risk of food getting caught in the windpipe, trachea, or lungs. You may need help or supervision during meals while following this diet. WHAT DO I NEED TO KNOW ABOUT THIS DIET? Foods  You may eat foods that are soft and have a pudding-like texture. If a food does not have this texture, you may be able to eat the food after:  Pureeing it. This can be done with a blender or whisk.  Moistening it with liquid. For example, you may have bread if you soak it in milk or syrup.  Avoid foods that are hard, dry, sticky, chunky, lumpy, or stringy. Also avoid foods with nuts, seeds, raisins, skins, and pulp.  Do not eat foods that you have to chew. If you have to chew the food, then you cannot eat it.  Eat a variety of foods to get all the nutrients you need. Liquids  You may drink liquids that are smooth. Your health care provider will tell you if you should drink thin or thickened liquids.  To thicken a liquid, use a food and beverage thickener or a thickening food. Thickened liquids are usually a "pudding-like" consistency.  Thin liquids include fruit juices, milk, coffee, tea, yogurts, shakes, and similar foods that melt to thin liquid at room temperature.  Avoid liquids with seeds, pulp, or chunks. See your dietitian or health care provider regularly for help with your dietary changes. WHAT FOODS CAN I EAT? Grains Store-bought soft breads, pancakes, and [place] toast that have a smooth, moist texture and do not have nuts or seeds (you  will need to moisten the food with liquid). Cooked cereals that have a pudding-like consistency, such as cream of wheat or farina (no oatmeal). Pureed, well-cooked pasta, rice, and plain bread stuffing. Vegetables Pureed vegetables. Soft avocado. Smooth tomato paste or sauce. Strained or pureed soups (these may need to be thickened as directed). Mashed or pureed potatoes without skin (can be seasoned with butter, smooth gravy, margarine, or sour cream). Fruits Pureed fruits such as melons and apples without seeds or pulp. Mashed bananas. Smooth tomato paste or sauce. Fruit juices without pulp or seeds. Strained or pureed soups. Meat and Other Protein Sources Pureed meat. Smooth pate or liverwurst. Smooth souffles. Pureed beans (such as lentils). Pureed eggs. Dairy Yogurt. Smooth cheese sauces. Milk (may need to be thickened). Nutritional dairy drinks or shakes. Ask your health care provider whether you can have ice cream. Condiments Finely ground salt, pepper, and other ground spices. Sweets/Desserts Smooth puddings and custards. Pureed desserts. Souffles. Whipped topping. Ask your health care provider whether you can have frozen desserts. Fats and Oils Butter. Margarine. Smooth and strained gravy. Sour cream. Mayonnaise. Cream cheese. Whipped topping. Smooth sauces (such as white sauce, cheese sauce, or hollandaise sauce). The items listed above may not be a complete list of recommended foods or beverages. Contact your dietitian for more options. WHAT FOODS ARE NOT RECOMMENDED? Grains Oatmeal. Dry cereals. Hard breads. Vegetables Whole vegetables. Stringy vegetables (such as celery). Thin tomato sauce. Fruits Whole fresh, frozen, canned, or dried fruits that have  not been pureed. Stringy fruits (such as pineapple). Meat and Other Protein Sources Whole or ground meat, fish, or poultry. Dried or cooked lentils or legumes that have been cooked but not mashed or pureed. Non-pureed eggs. Nuts and  seeds. Peanut butter. Dairy Non-pureed cheese. Dairy products with lumps or chunks. Ask your health care provider whether you can have ice cream. Condiments Coarse or seeded herbs and spices. Sweets/Desserts Luana preserves. Jams with seeds. Solid desserts. Sticky, chewy sweets (such as licorice and caramel). Ask your health care provider whether you can have frozen desserts. Fats and Oils Sauces of fats with lumps or chunks. The items listed above may not be a complete list of foods and beverages to avoid. Contact your dietitian for more information.   This information is not intended to replace advice given to you by your health care provider. Make sure you discuss any questions you have with your health care provider.   Document Released: 02/24/2005 Document Revised: 03/17/2014 Document Reviewed: 02/07/2013 Elsevier Interactive Patient Education 2016 Elsevier Inc.  Urinary Tract Infection Urinary tract infections (UTIs) can develop anywhere along your urinary tract. Your urinary tract is your body's drainage system for removing wastes and extra water. Your urinary tract includes two kidneys, two ureters, a bladder, and a urethra. Your kidneys are a pair of bean-shaped organs. Each kidney is about the size of your fist. They are located below your ribs, one on each side of your spine. CAUSES Infections are caused by microbes, which are microscopic organisms, including fungi, viruses, and bacteria. These organisms are so small that they can only be seen through a microscope. Bacteria are the microbes that most commonly cause UTIs. SYMPTOMS  Symptoms of UTIs may vary by age and gender of the patient and by the location of the infection. Symptoms in young women typically include a frequent and intense urge to urinate and a painful, burning feeling in the bladder or urethra during urination. Older women and men are more likely to be tired, shaky, and weak and have muscle aches and abdominal  pain. A fever may mean the infection is in your kidneys. Other symptoms of a kidney infection include pain in your back or sides below the ribs, nausea, and vomiting. DIAGNOSIS To diagnose a UTI, your caregiver will ask you about your symptoms. Your caregiver will also ask you to provide a urine sample. The urine sample will be tested for bacteria and white blood cells. White blood cells are made by your body to help fight infection. TREATMENT  Typically, UTIs can be treated with medication. Because most UTIs are caused by a bacterial infection, they usually can be treated with the use of antibiotics. The choice of antibiotic and length of treatment depend on your symptoms and the type of bacteria causing your infection. HOME CARE INSTRUCTIONS  If you were prescribed antibiotics, take them exactly as your caregiver instructs you. Finish the medication even if you feel better after you have only taken some of the medication.  Drink enough water and fluids to keep your urine clear or pale yellow.  Avoid caffeine, tea, and carbonated beverages. They tend to irritate your bladder.  Empty your bladder often. Avoid holding urine for long periods of time.  Empty your bladder before and after sexual intercourse.  After a bowel movement, women should cleanse from front to back. Use each tissue only once. SEEK MEDICAL CARE IF:   You have back pain.  You develop a fever.  Your symptoms do not  begin to resolve within 3 days. SEEK IMMEDIATE MEDICAL CARE IF:   You have severe back pain or lower abdominal pain.  You develop chills.  You have nausea or vomiting.  You have continued burning or discomfort with urination. MAKE SURE YOU:   Understand these instructions.  Will watch your condition.  Will get help right away if you are not doing well or get worse.   This information is not intended to replace advice given to you by your health care provider. Make sure you discuss any questions  you have with your health care provider.   Document Released: 12/04/2004 Document Revised: 11/15/2014 Document Reviewed: 04/04/2011 Elsevier Interactive Patient Education [employer].

## 2015-03-10 NOTE — Care Management Note (Signed)
Case Management Note  Patient Details  Name: HERLINDA MARMON MRN: [id number] Date of Birth: 1932-06-22  Subjective/Objective:    UTI, FTT                Action/Plan: NCM spoke to son, Duana Lautt. States they will discuss with the PCP about getting hospital bed. States his pt has a bed that head and feed adjust but need a bed that will go up for the caregivers. [employer] to deliver suction machine to the home. Pt is established with [facility]. Family continues to pay so that if pt needs to go to SNF, facility will accommodate. Pt has 24 hour aide services set up by [facility]. Son states they have used [place] in the past for [facility].   Expected Discharge Date:  03/10/2015              Expected Discharge Plan:  Home/Self Care  In-House Referral:  NA  Discharge planning Services  CM Consult  Post Acute Care Choice:  NA Choice offered to:  NA  DME Arranged:  N/A DME Agency:  NA  HH Arranged:  NA HH Agency:  NA  Status of Service:  Completed, signed off  Medicare Important Message Given:  Yes Date Medicare IM Given:    Medicare IM give by:    Date Additional Medicare IM Given:    Additional Medicare Important Message give by:     If discussed at [place] of Stay Meetings, dates discussed:    Additional Comments:  Erenest Rasher, RN 03/10/2015, 2:36 PM

## 2015-03-10 NOTE — Discharge Summary (Addendum)
Physician Discharge Summary  Isabella George [id number] DOB: May 16, 1932 DOA: 03/07/2015  PCP: Hollace Kinnier, DO  Outpatient Specialists:   Neurology: Dr. Wells Guiles Tat  Admit date: 03/07/2015 Discharge date: 03/10/2015  Time spent: Greater than 30 minutes  Recommendations for Outpatient Follow-up:  1. Dr. Hollace Kinnier, PCP in one week with repeat labs (CBC & BMP). Please follow final urine culture results that were sent from the hospital.  Discharge Diagnoses:  Principal Problem:   UTI (lower urinary tract infection) Active Problems:   Acid reflux   Essential hypertension   Cough   Muscle spasticity   PSP (progressive supranuclear palsy) (HCC)   Parkinsonism ([place])   Palliative care encounter   DNR (do not resuscitate)   Dysphagia   Acute encephalopathy   Discharge Condition: Improved & Stable  Diet recommendation: Dysphagia 1 diet and thin liquids.  Filed Weights   03/07/15 1716 03/08/15 0450 03/10/15 0530  Weight: 68.493 kg (151 lb) 71.442 kg (157 lb 8 oz) 67.8 kg (149 lb 7.6 oz)    History of present illness:  81 y.o. female , lives with son, has assistance from CNA's at home, bed bound and moves around with wheelchair assistance, PMH of long-term parkinsonism, progressive supranuclear palsy, CVA, dysphagia, dementia, bowel and bladder incontinence, GERD, HTN, presented to the [facility] ED with cough, decreased oral intake, weakness and low-grade fever. Evaluation in the ED was suggestive of dehydration and UTI. As per CNA who has known patient for approximately 2 years, patient has had a steady decline over the last 1 month with increased sleepiness, decreased interaction, intermittent persistent cough-sometimes with oral intake, decreased oral intake which now is only soups.  Hospital Course:   Escherichia coli Urinary tract infection - Treated empirically with IV Rocephin-completed 3 days treatment. Final sensitivities are pending. We will discharge  patient on oral Ceftin to complete a total 7 days course. Discussed with microbiology lab-they will call me on 03/11/15 with final sensitivities to see if any antibiotic changes need to be made.  Acute encephalopathy complicating chronic mental status changes - Likely precipitated by UTI and dehydration. Treat underlying cause and monitor closely. - Mental status has definitely improved since admission. As per CNA at bedside, mental status may be close to her baseline.  Cough - No pneumonia on initial or follow-up chest x-ray. May be acute viral bronchitis versus periodic aspiration. Treat supportively. Nebulizations apparently help at home.  - Seems to have improved. Haven't seen her cough since admission.  Dehydration - Resolved after IV fluid hydration.  GERD - Continue home Pepcid and omeprazole.  Essential hypertension - Controlled off of medications at home  Long-term parkinsonism/progressive supranuclear palsy/muscle spasticity with functional quadriplegia - Follows with Dr. Carles Collet, neurology as outpatient. Nonambulatory at baseline and does not speak much either.  - Palliative care team consulted after discussing with patient's son   Prolonged QTC - Resolved as per RN report on 12/29 (EKG from yesterday does not seem to have been scanned into Epic)  Leukopenia - Unclear etiology. Improved. Thrombocytopenia resolved  Adult failure to thrive - Palliative care was consulted after discussing with patient's healthcare power of attorney. Discussed with palliative care team: Treat the treatable to prolong life at this time, no artificial feeding now or in the future, comfort feeds as tolerated with family being aware of risks of aspiration, DC home when stable with home health services and 24/7 caregivers and son is aware to trigger hospice - As per son at bedside, patient  tolerating comfort feeds.  Transient hypoglycemia - Noted on BMP yesterday morning. Likely secondary to poor oral  intake. Asymptomatic. Resolved.  DO NOT RESUSCITATE  Discussed with patient's son at bedside.    Consultants:  Palliative care team.  Procedures:  None  Antibiotics:  IV Rocephin 2/28 >   Discharge Exam:  Complaints: No complaints reported. As per RN, alert and tolerated breakfast. As per son at bedside, no acute events and did tolerate comfort feeds.  Filed Vitals:   03/09/15 1327 03/09/15 2154 03/09/15 2200 03/10/15 0530  BP: 108/53 148/64 140/93 145/61  Pulse: 96 93 101 92  Temp: 99 F (37.2 C) 98.7 F (37.1 C) 98.4 F (36.9 C) 98.8 F (37.1 C)  TempSrc: Axillary Axillary Axillary Axillary  Resp: 20 18 20 17   Height:      Weight:    67.8 kg (149 lb 7.6 oz)  SpO2: 98% 97% 100% 99%    General exam: Moderately built and nourished elderly female patient, lying comfortably supine on the gurney in no obvious distress. She is generally contracted at baseline. Respiratory system: Clear. No increased work of breathing. Cardiovascular system: S1 & S2 heard, RRR. No JVD, murmurs, gallops, clicks or pedal edema. Gastrointestinal system: Abdomen is nondistended, soft and nontender. Normal bowel sounds heard. Central nervous system: Alert and oriented to person and place. Able to answer simple questions. No focal neurological deficits. Extremities: Generally contracted at baseline, upper extremities >lower extremities. Peripheral pulses symmetrically felt.   Discharge Instructions      Discharge Instructions    Call MD for:  difficulty breathing, headache or visual disturbances    Complete by:  As directed      Call MD for:  extreme fatigue    Complete by:  As directed      Call MD for:  persistant dizziness or light-headedness    Complete by:  As directed      Call MD for:  persistant nausea and vomiting    Complete by:  As directed      Call MD for:  severe uncontrolled pain    Complete by:  As directed      Call MD for:  temperature >100.4    Complete by:   As directed      Call MD for:    Complete by:  As directed   Worsening confusion or mental status change.     Discharge instructions    Complete by:  As directed   Diet: Dysphagia 1 diet and thin liquids as per instructions.     Increase activity slowly    Complete by:  As directed             Medication List    STOP taking these medications        chlorpheniramine 4 MG tablet  Commonly known as:  CHLOR-TRIMETON      TAKE these medications        ALIGN PO  Take 1 capsule by mouth daily.     AMBULATORY NON FORMULARY MEDICATION  Suction Aspirator Machine Use As Directed  Dx: G23.1 (PSP), R13.14 (Dysphagia)     aspirin 81 MG tablet  Take 81 mg by mouth daily.     b complex vitamins capsule  Take 1 capsule by mouth daily.     cefUROXime 250 MG tablet  Commonly known as:  CEFTIN  Take 1 tablet (250 mg total) by mouth 2 (two) times daily with a meal.     denosumab 60  MG/ML Soln injection  Commonly known as:  PROLIA  Inject 60 mg into the skin every 6 (six) months. Administer in upper arm, thigh, or abdomen     donepezil 5 MG tablet  Commonly known as:  ARICEPT  Take 1 tablet (5 mg total) by mouth at bedtime.     EPINEPHrine 0.3 mg/0.3 mL Soaj injection  Commonly known as:  EPI-PEN  Inject 0.3 mLs (0.3 mg total) into the muscle once.     famotidine 10 MG chewable tablet  Commonly known as:  PEPCID AC  Chew 10 mg by mouth 2 (two) times daily as needed for heartburn.     ipratropium-albuterol 0.5-2.5 (3) MG/3ML Soln  Commonly known as:  DUONEB  Take 3 mLs by nebulization every 6 (six) hours as needed (congestion, wheezing, coughing). Dx acute bronchitis with bronchospasm     mupirocin ointment 2 %  Commonly known as:  BACTROBAN  Apply 1 application topically 2 (two) times daily. APPLY TO AFFECTED AREAS AS NEEDED     omeprazole 40 MG capsule  Commonly known as:  PRILOSEC  Take 40 mg by mouth 2 (two) times daily.     ondansetron 8 MG disintegrating tablet   Commonly known as:  ZOFRAN ODT  Take 1 tablet (8 mg total) by mouth every 8 (eight) hours as needed for nausea or vomiting.     triamcinolone cream 0.1 %  Commonly known as:  KENALOG  Apply 1 application topically daily as needed (rash).     vitamin B-12 1000 MCG tablet  Commonly known as:  CYANOCOBALAMIN  Take 1,000 mcg by mouth daily.     Vitamin D3 Liqd  Take 1,000 Units by mouth daily.       Follow-up Information    Follow up with REED, TIFFANY, DO. Schedule an appointment as soon as possible for a visit in 1 week.   Specialty:  Geriatric Medicine   Why:  To be seen with repeat labs (CBC & BMP).   Contact information:   [place] [address] [phone]        The results of significant diagnostics from this hospitalization (including imaging, microbiology, ancillary and laboratory) are listed below for reference.    Significant Diagnostic Studies: Dg Chest 2 View  03/07/2015  CLINICAL DATA:  Cough x 2 days; pt's family member states that pt has chronic cough which worsened 2 days ago; hx breast CA; EXAM: CHEST  2 VIEW COMPARISON:  01/30/2015. FINDINGS: Cardiomegaly. Severe thoracolumbar scoliosis convex RIGHT. LEFT mastectomy and axillary node dissection. Compressive atelectasis at the LEFT base due to elevated LEFT hemidiaphragm, but focal airspace disease not established. No evidence for failure or effusion. IMPRESSION: No active cardiopulmonary disease.  Stable exam. Electronically Signed   By: Staci Righter M.D.   On: 03/07/2015 13:58   Dg Chest Port 1 View  03/08/2015  CLINICAL DATA:  Cough. EXAM: PORTABLE CHEST 1 VIEW COMPARISON:  March 07, 2015. FINDINGS: Stable cardiomegaly. No pneumothorax or significant pleural effusion is noted. Left axillary surgical clips are noted. Stable dextroscoliosis of lower thoracic spine is noted. No acute pulmonary disease is noted. IMPRESSION: No acute cardiopulmonary abnormality seen. Electronically Signed    By: Marijo Conception, M.D.   On: 03/08/2015 09:59    Microbiology: Recent Results (from the past 240 hour(s))  Urine culture     Status: None (Preliminary result)   Collection Time: 03/07/15  3:34 PM  Result Value Ref Range Status   Specimen Description  URINE, CATHETERIZED  Final   Special Requests Normal  Final   Culture   Final    CULTURE REINCUBATED FOR BETTER GROWTH Performed at [facility]    Report Status PENDING  Incomplete     Labs: Basic Metabolic Panel:  Recent Labs Lab 03/06/15 2017 03/07/15 1225 03/09/15 0410  NA 141 142 140  K 3.7 3.7 4.4  CL 107 106 109  CO2 23 25 19*  GLUCOSE 104* 94 65  BUN 20 21* 10  CREATININE 0.63 0.68 0.52  CALCIUM 9.2 9.0 8.3*  MG  --  1.9 1.7   Liver Function Tests: No results for input(s): AST, ALT, ALKPHOS, BILITOT, PROT, ALBUMIN in the last 168 hours. No results for input(s): LIPASE, AMYLASE in the last 168 hours. No results for input(s): AMMONIA in the last 168 hours. CBC:  Recent Labs Lab 03/06/15 2017 03/07/15 1225 03/08/15 0430 03/09/15 0410  WBC 3.6* 3.0* 3.6* 3.9*  HGB 14.6 15.2* 12.6 13.0  HCT 44.5 46.8* 38.7 40.1  MCV 91.8 92.5 92.4 93.9  PLT 166 163 148* 158   Cardiac Enzymes: No results for input(s): CKTOTAL, CKMB, CKMBINDEX, TROPONINI in the last 168 hours. BNP: BNP (last 3 results) No results for input(s): BNP in the last 8760 hours.  ProBNP (last 3 results) No results for input(s): PROBNP in the last 8760 hours.  CBG:  Recent Labs Lab 03/06/15 1956 03/09/15 1834 03/09/15 2353  GLUCAP 96 60* 66        Signed:  Vernell Leep, MD, FACP, FHM. [facility] Pager [phone]  If 7PM-7AM, please contact night-coverage [email or web address] Password [email or web address] 03/10/2015, 1:20 PM

## 2015-03-10 NOTE — Care Management Important Message (Signed)
Important Message  Patient Details  Name: Isabella George MRN: [id number] Date of Birth: 1933-01-02   Medicare Important Message Given:  Yes    Erenest Rasher, RN 03/10/2015, 2:07 PM

## 2015-03-11 LAB — URINE CULTURE: SPECIAL REQUESTS: NORMAL

## 2015-03-14 ENCOUNTER — Encounter: Payer: Self-pay | Admitting: Internal Medicine

## 2015-03-14 ENCOUNTER — Non-Acute Institutional Stay (SKILLED_NURSING_FACILITY): Payer: Medicare Other | Admitting: Internal Medicine

## 2015-03-14 VITALS — BP 118/62 | HR 104 | Temp 99.7°F

## 2015-03-14 DIAGNOSIS — R1314 Dysphagia, pharyngoesophageal phase: Secondary | ICD-10-CM | POA: Diagnosis not present

## 2015-03-14 DIAGNOSIS — R627 Adult failure to thrive: Secondary | ICD-10-CM | POA: Diagnosis not present

## 2015-03-14 DIAGNOSIS — G231 Progressive supranuclear ophthalmoplegia [Steele-Richardson-Olszewski]: Secondary | ICD-10-CM | POA: Diagnosis not present

## 2015-03-14 DIAGNOSIS — R5382 Chronic fatigue, unspecified: Secondary | ICD-10-CM

## 2015-03-14 DIAGNOSIS — N390 Urinary tract infection, site not specified: Secondary | ICD-10-CM

## 2015-03-14 DIAGNOSIS — N3090 Cystitis, unspecified without hematuria: Secondary | ICD-10-CM

## 2015-03-14 DIAGNOSIS — A419 Sepsis, unspecified organism: Secondary | ICD-10-CM

## 2015-03-14 LAB — CBC AND DIFFERENTIAL
HCT: 44 % (ref 36–46)
Hemoglobin: 14.6 g/dL (ref 12.0–16.0)
Platelets: 272 10*3/uL (ref 150–399)
WBC: 7.7 10*3/mL

## 2015-03-14 LAB — BASIC METABOLIC PANEL
BUN: 18 mg/dL (ref 4–21)
CREATININE: 0.6 mg/dL (ref 0.5–1.1)
GLUCOSE: 109 mg/dL
POTASSIUM: 3.8 mmol/L (ref 3.4–5.3)
SODIUM: 142 mmol/L (ref 137–147)

## 2015-03-14 LAB — HEPATIC FUNCTION PANEL
ALT: 26 U/L (ref 7–35)
AST: 29 U/L (ref 13–35)
Alkaline Phosphatase: 55 U/L (ref 25–125)
Bilirubin, Total: 0.7 mg/dL

## 2015-03-14 NOTE — Progress Notes (Signed)
Patient ID: Isabella George, female   DOB: 08-19-1932, 82 y.o.   MRN: [id number]   Location: [facility] Provider: Rexene Edison. Mariea Clonts, D.O., C.M.D.  Code Status: DNR Goals of Care: Advanced Directive information Does patient have an advance directive?: Yes, Type of Advance Directive: Out of facility DNR (pink MOST or yellow form)  Chief Complaint  Patient presents with  . Hospitalization Follow-up    in ER 03/07/15 for cough, decrease oral intake, weakness, low grade fever. Dx UTI. Here with son and 2 aids    HPI: Patient is a 82 y.o. female seen in the office today for hospital follow-up for admission 03/07/15 with cough, decreased oral intake, weakness and low grade fever.  She has a h/o progressive supranuclear palsy with contractures, minimal speech, poor po intake at baseline.  She is dependent in all ADLs and has 24x7 care at home with 2 [facility] aides and 2 private aides (who are here now).  Pt was initially seen in clinic, but, due to tachycardia, fever, and persistent very poor intake (ongoing for about 3 wks per her son, but improved since d/c from the hospital), she was admitted to rehab and seen again here.  During her hospitalization, her workup revealed an e coli uti and she was started on ceftin bid for that prior to discharge.  She has missed one tablet thus far and it's being crushed and given in yogurt/pudding/applesauce, but pt is not accepting it every time. She has hardly eaten at all per caregivers though son reports some intake. She is not speaking to me at all today.  Also during hospital stay, she was evaluated by the palliative care team to consider feeding tube placement.  Overall, this was not recommended and Aaron Edelman clearly recalls Mrs. Harkin telling him that she did not want tube feedings due to another relative deteriorating after tube feeding was started.  He also notes she was clear that she did not want to be put through CPR/ventilation.  DNR form was  completed here and placed on rehab chart.  We also discussed avoiding further hospitalizations and Aaron Edelman agrees that his mother would prefer to stay here for her care.  We discussed the idea of hospice at home if she does not improve from here, and he seems to embrace this idea in conjunction with a hospital bed that goes up and down, the hoyer lift we've ordered, ongoing home care, and use of the aspirator, as well as a new hand splint through Insurance underwriter.  Goal is to get her UTI treatment completed, hydration and be sure she can be fed at home and kept comfortable.    Review of Systems:  Review of Systems  Unable to perform ROS: medical condition  PSP--mostly nonverbal, also lethargic  Past Medical History  Diagnosis Date  . Osteoporosis, senile   . Cancer [facility])     Breast Cancer  . Acid reflux   . Hip fracture ([place])   . Depressive disorder, not elsewhere classified 2012  . Anemia, unspecified   . Acute posthemorrhagic anemia 06/2011  . Malignant neoplasm of breast (female), unspecified site 71    s/p left mastectomy  . Sleep disturbance, unspecified   . Abnormality of gait   . Allergic rhinitis due to pollen   . Unspecified essential hypertension   . Restless leg syndrome 08/04/2012  . Osteoarthritis of both knees   . Rhinitis, non-allergic   . Memory loss   . Osteoarthrosis, unspecified whether generalized or  localized, unspecified site 12/17/2011  . Debility, unspecified 07/25/2011  . Aneurysm of unspecified site ([place]) 07/08/2011  . Unspecified venous (peripheral) insufficiency 07/08/2011  . Closed fracture of intertrochanteric section of femur ([place]) 07/07/2011  . Dizziness and giddiness 06/23/2011  . Personal history of fall 06/23/2011  . Disturbance of skin sensation 05/26/2011  . Contusion of toe 05/26/2011  . Esophagitis, unspecified 04/14/2011  . Diaphragmatic hernia without mention of obstruction or gangrene 04/14/2011  . Loss of weight 04/14/2011  . Closed fracture of two  ribs 04/14/2011  . Ulcer of esophagus 02/25/2011  . Chest pain, unspecified 02/10/2011  . Unspecified hereditary and idiopathic peripheral neuropathy 02/05/2011  . Scoliosis (and kyphoscoliosis), idiopathic   . Unspecified urinary incontinence 12/27/2010  . Closed fracture of rib(s), unspecified 12/27/2010  . 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 Hyperlipidemia     Past Surgical History  Procedure Laterality Date  . Appendectomy    . Tonsillectomy    . Anal fissuer    . Anal fissure repair    . Varicose vein surgery      Ligation of vein  . Tongue surgery    . Hand surgery    . Femur im nail  07/03/2011    Procedure: INTRAMEDULLARY (IM) NAIL FEMORAL;  Surgeon: Melina Schools, MD;  Location: [facility];  Service: Orthopedics;  Laterality: Left;  Synthes troch nail, jackson bed, c-arm  . Hip fracture surgery    . Breast surgery  1986    Left mastectomy  . Tongue surgery  2000    lump on back of tongue Dr. Owens Shark  . Cataract extraction w/ intraocular lens implant  2000    right  . Orif acetabular fracture  07/03/2011    left hip Dr. Rolena Infante  . Esophagogastroduodenoscopy endoscopy  [id number]    Dr. Cristina Gong with biopsy gastroesophageal junction mucosa with focal ulceration no intestinal metaplasia, dysphasia, or malignancy. Does have a hiatal hernia with esophagitis  . Colonoscopy w/ biopsies  02/21/2011    polyp-hyperplastic polyp, no adenomatous change or malignancy Dr. Cristina Gong   Social History   Social History  . Marital Status: Widowed    Spouse Name: N/A  . Number of Children: N/A  . Years of Education: N/A   Occupational History  . Not on file.   Social History Main Topics  . Smoking status: Never Smoker   . Smokeless tobacco: Never Used  . Alcohol Use: 0.0 oz/week    0 Standard drinks or equivalent per week     Comment: rare  . Drug Use: No  . Sexual Activity: No   Other Topics Concern  . Not on file   Social History Narrative   Lives off  [place]   Patient is widowed.    Patient has 2 children.   Patient has 3 years or college.   Patient is retired.    Never smoked   Alcohol none   Exercise none         Family History  Problem Relation Age of Onset  . Heart disease Mother   . Heart disease Brother   . Heart attack Brother   . Cancer Father     Liver cancer  . Diabetes Paternal Aunt   . COPD Brother   . Deep vein thrombosis Brother   . Varicose Veins Brother   . Hyperlipidemia Son     Allergies  Allergen Reactions  . Bee Venom Anaphylaxis  . Adhesive [Tape] Rash  Medication List       This list is accurate as of: 03/14/15  6:22 PM.  Always use your most recent med list.               ALIGN PO  Take 1 capsule by mouth daily.     AMBULATORY NON FORMULARY MEDICATION  Suction Aspirator Machine Use As Directed  Dx: G23.1 (PSP), R13.14 (Dysphagia)     aspirin 81 MG tablet  Take 81 mg by mouth daily.     b complex vitamins capsule  Take 1 capsule by mouth daily.     cefUROXime 250 MG tablet  Commonly known as:  CEFTIN  Take 1 tablet (250 mg total) by mouth 2 (two) times daily with a meal.     denosumab 60 MG/ML Soln injection  Commonly known as:  PROLIA  Inject 60 mg into the skin every 6 (six) months. Administer in upper arm, thigh, or abdomen     donepezil 5 MG tablet  Commonly known as:  ARICEPT  Take 1 tablet (5 mg total) by mouth at bedtime.     EPINEPHrine 0.3 mg/0.3 mL Soaj injection  Commonly known as:  EPI-PEN  Inject 0.3 mLs (0.3 mg total) into the muscle once.     famotidine 10 MG chewable tablet  Commonly known as:  PEPCID AC  Chew 10 mg by mouth 2 (two) times daily as needed for heartburn.     ipratropium-albuterol 0.5-2.5 (3) MG/3ML Soln  Commonly known as:  DUONEB  Take 3 mLs by nebulization every 6 (six) hours as needed (congestion, wheezing, coughing). Dx acute bronchitis with bronchospasm     mupirocin ointment 2 %  Commonly known as:  BACTROBAN    Apply 1 application topically 2 (two) times daily. APPLY TO AFFECTED AREAS AS NEEDED     omeprazole 40 MG capsule  Commonly known as:  PRILOSEC  Take 40 mg by mouth 2 (two) times daily.     ondansetron 8 MG disintegrating tablet  Commonly known as:  ZOFRAN ODT  Take 1 tablet (8 mg total) by mouth every 8 (eight) hours as needed for nausea or vomiting.     triamcinolone cream 0.1 %  Commonly known as:  KENALOG  Apply 1 application topically daily as needed (rash).     vitamin B-12 1000 MCG tablet  Commonly known as:  CYANOCOBALAMIN  Take 1,000 mcg by mouth daily.     Vitamin D3 Liqd  Take 1,000 Units by mouth daily.        Health Maintenance  Topic Date Due  . ZOSTAVAX  02/19/1995  . INFLUENZA VACCINE  10/09/2014  . TETANUS/TDAP  03/10/2017  . DEXA SCAN  Completed  . PNA vac Low Risk Adult  Completed    Physical Exam: Filed Vitals:   03/14/15 1604  BP: 118/62  Pulse: 104  Temp: 99.7 F (37.6 C)  TempSrc: Oral  SpO2: 99%   There is no weight on file to calculate BMI. Physical Exam  Constitutional: No distress.  HENT:  Head: Normocephalic and atraumatic.  Eyes:  ophthalmoplegia  Neck: Neck supple. No JVD present.  Cardiovascular: Intact distal pulses.   Tachy, regular  Pulmonary/Chest: Effort normal and breath sounds normal. No respiratory distress. She has no wheezes. She has no rales.  Abdominal: Soft. Bowel sounds are normal. She exhibits no distension. There is no tenderness.  Musculoskeletal:  Contractures of extremities present; dead weight; requires hoyer for transfers--cannot participate in process at all  Lymphadenopathy:  She has no cervical adenopathy.  Neurological:  Lethargic, opens eyes for some people, may say one word here and there  Skin: Skin is warm and dry.  Very warm  Psychiatric:  Flat affect    Labs reviewed: Basic Metabolic Panel:  Recent Labs  03/28/14 1116 10/26/14 1314  03/06/15 2017 03/07/15 1225 03/09/15 0410   NA 142 140  < > 141 142 140  K 4.3 4.5  < > 3.7 3.7 4.4  CL 104 101  < > 107 106 109  CO2 22 21  < > 23 25 19*  GLUCOSE 89 91  < > 104* 94 65  BUN 16 15  < > 20 21* 10  CREATININE 0.60 0.68  < > 0.63 0.68 0.52  CALCIUM 8.7 9.0  < > 9.2 9.0 8.3*  MG  --   --   --   --  1.9 1.7  TSH 2.180 2.700  --   --   --   --   < > = values in this interval not displayed. Liver Function Tests:  Recent Labs  03/28/14 1116 01/30/15 1233  AST 18 20  ALT 23 19  ALKPHOS 66 62  BILITOT 0.4 0.7  PROT 6.8 7.2  ALBUMIN 3.9 4.2   No results for input(s): LIPASE, AMYLASE in the last 8760 hours. No results for input(s): AMMONIA in the last 8760 hours. CBC:  Recent Labs  03/28/14 1116 10/26/14 1314  03/07/15 1225 03/08/15 0430 03/09/15 0410  WBC 5.6 4.9  < > 3.0* 3.6* 3.9*  NEUTROABS 2.6 2.2  --   --   --   --   HGB 14.4  --   < > 15.2* 12.6 13.0  HCT 41.0 42.3  < > 46.8* 38.7 40.1  MCV 89  --   < > 92.5 92.4 93.9  PLT 220  --   < > 163 148* 158  < > = values in this interval not displayed. Lipid Panel:  Recent Labs  03/28/14 1116 10/26/14 1314  CHOL 273* 279*  HDL 49 48  LDLCALC 204* 214*  TRIG 102 83  CHOLHDL 5.6* 5.8*   Procedures since last visit:  Dg Chest 2 View  03/07/2015  CLINICAL DATA:  Cough x 2 days; pt's family member states that pt has chronic cough which worsened 2 days ago; hx breast CA; EXAM: CHEST  2 VIEW COMPARISON:  01/30/2015. FINDINGS: Cardiomegaly. Severe thoracolumbar scoliosis convex RIGHT. LEFT mastectomy and axillary node dissection. Compressive atelectasis at the LEFT base due to elevated LEFT hemidiaphragm, but focal airspace disease not established. No evidence for failure or effusion. IMPRESSION: No active cardiopulmonary disease.  Stable exam. Electronically Signed   By: Staci Righter M.D.   On: 03/07/2015 13:58   Dg Chest Port 1 View  03/08/2015  CLINICAL DATA:  Cough. EXAM: PORTABLE CHEST 1 VIEW COMPARISON:  March 07, 2015. FINDINGS: Stable  cardiomegaly. No pneumothorax or significant pleural effusion is noted. Left axillary surgical clips are noted. Stable dextroscoliosis of lower thoracic spine is noted. No acute pulmonary disease is noted. IMPRESSION: No acute cardiopulmonary abnormality seen. Electronically Signed   By: Marijo Conception, M.D.   On: 03/08/2015 09:59   Assessment/Plan 1. Sepsis secondary to UTI [facility]) -suspected based on previous results, clear lungs -has known infection with tachycardia and fever -cbc, cmp, blood cxs x 2 and I/O cath UA c+s ordered stat -unable to take po at present so NPO (ideallly until seen by ST, but may  swab mouth with liquids) -IVF 100cc/hr x 2 liters -begin rocephin 1g IV daily for 3 doses -avoid hospitalization--reviewed goals of care as in hpi with her son, Aaron Edelman  2. Cystitis -cont UTI treatment with rocephin -was unable to take po ceftin at this point  3. Adult failure to thrive syndrome -has been progressing since November -seems aspiration has worsened  4. PSP (progressive supranuclear palsy) ([place]) -late stages with periods of no po intake -no feeding tube -fluids and then return to tolerable diet as per ST recs -has contractures and mostly nonverbal -discussed hospice care, but will see if she makes any improvements with UTI treatment and hydration  5. Dysphagia, pharyngoesophageal phase -seems this has worsened over time -ST eval and treat  6. Chronic fatigue -initially Aaron Edelman was asking for something to keep her awake to eat, but we discussed that that would be contraindicated in her current condition and it would be fighting the natural progression of her PSP disease process  Labs/tests ordered: cbc, cmp, blood cxs x 2, I/O UA c+s stat  At least 30 mins were spent with pt's son discussing goals of care  Shahzain Kiester L. Harveer Sadler, D.O. [facility] [address], [place] [address] Cell Phone (Mon-Fri 8am-5pm):   [phone] On Call:  [phone] & follow prompts after 5pm & weekends Office Phone:  [phone] Office Fax:  [phone]

## 2015-03-15 ENCOUNTER — Telehealth: Payer: Self-pay | Admitting: *Deleted

## 2015-03-15 NOTE — Telephone Encounter (Signed)
Patient son, Isabella George called and stated that you saw his mom yesterday. He wants to know how long do you think she will be in Rehab.?  And, son wants an order faxed to [facility] for a Hospital Bed to be delivered to patient's apartment this weekend for patient to have when she returns due to Palsy and trouble swallowing. Please Advise.

## 2015-03-15 NOTE — Telephone Encounter (Signed)
Unclear how long she'll be in rehab.  Depends on how she does.  Ok to do order for hospital bed to [facility]--must be electric and dxs:  Progressive supranuclear palsy, dysphagia, recurrent aspiration

## 2015-03-16 ENCOUNTER — Encounter: Payer: Self-pay | Admitting: *Deleted

## 2015-03-16 ENCOUNTER — Non-Acute Institutional Stay (SKILLED_NURSING_FACILITY): Payer: Medicare Other | Admitting: Adult Health

## 2015-03-16 ENCOUNTER — Encounter: Payer: Self-pay | Admitting: Adult Health

## 2015-03-16 DIAGNOSIS — K117 Disturbances of salivary secretion: Secondary | ICD-10-CM

## 2015-03-16 DIAGNOSIS — N390 Urinary tract infection, site not specified: Secondary | ICD-10-CM

## 2015-03-16 DIAGNOSIS — G231 Progressive supranuclear ophthalmoplegia [Steele-Richardson-Olszewski]: Secondary | ICD-10-CM

## 2015-03-16 DIAGNOSIS — R682 Dry mouth, unspecified: Secondary | ICD-10-CM

## 2015-03-16 DIAGNOSIS — R638 Other symptoms and signs concerning food and fluid intake: Secondary | ICD-10-CM | POA: Diagnosis not present

## 2015-03-16 MED ORDER — AMBULATORY NON FORMULARY MEDICATION: Status: DC

## 2015-03-16 NOTE — Telephone Encounter (Signed)
Patient son, Aaron Edelman notified and printed Rx for Hospital bed and faxed to [facility]. Fax#: [phone]

## 2015-03-16 NOTE — Progress Notes (Signed)
Patient ID: Isabella George, female   DOB: May 20, 1932, 82 y.o.   MRN: [id number]    Nursing Home Location:  [place] of Service: SNF [phone])  Patient Care Team: Gayland Curry, DO as PCP - General ([place]) Lindwood Coke, MD as Consulting Physician (Dermatology) Bennetta Laos, MD as Consulting Physician (Obstetrics and Gynecology) Clent Jacks, MD as Consulting Physician (Ophthalmology) Lennon Alstrom, MD as Consulting Physician (Neurology) Melina Schools, MD as Consulting Physician (Orthopedic Surgery) [facility] Gaynelle Arabian, MD as Consulting Physician (Orthopedic Surgery)  PCP: Hollace Kinnier, DO  Allergies  Allergen Reactions  . Bee Venom Anaphylaxis  . Adhesive [Tape] Rash    Chief Complaint  Patient presents with  . Acute Visit    f/u uti, decreased intake    HPI:  Patient is a 82 y.o. female seen today at [facility] in skilled rehab to follow up on IVF for dehydration and probable UTI diagnosis. Originally admitted 03/07/15-03/10/15 for UTI E.Coli and given Rocephin, changed to cipro and discharged home. At home she was not eating and drinking and was lethargic. Seen by Dr. Mariea Clonts on 1/4 and admitted to rehab for IV rocephin and 2L of IVF. Resident CBC and BMP were unrevealing. UA C and S also unrevealing. Resident has diagnosis of PSP and has progressive functional decline with decreased oral intake. Has periods of lethargy and periods of wakefullness. Has had trouble with managing oral secretions and needed suctioning. Resident did wake up this morning and request bacon and eggs. Resident lives at home with 24 hour care givers and wishes to return home when able. POA, Gaspar Bidding, would like to try to feed Ms. Gaccione when she is more alert.  She is a DNR and does not wish to have a feeding tube.  Review of Systems:  Review of Systems  Unable to perform ROS: Dementia    Past Medical History    Diagnosis Date  . Osteoporosis, senile   . Cancer [facility])     Breast Cancer  . Acid reflux   . Hip fracture ([place])   . Depressive disorder, not elsewhere classified 2012  . Anemia, unspecified   . Acute posthemorrhagic anemia 06/2011  . Malignant neoplasm of breast (female), unspecified site 53    s/p left mastectomy  . Sleep disturbance, unspecified   . Abnormality of gait   . Allergic rhinitis due to pollen   . Unspecified essential hypertension   . Restless leg syndrome 08/04/2012  . Osteoarthritis of both knees   . Rhinitis, non-allergic   . Memory loss   . Osteoarthrosis, unspecified whether generalized or localized, unspecified site 12/17/2011  . Debility, unspecified 07/25/2011  . Aneurysm of unspecified site ([place]) 07/08/2011  . Unspecified venous (peripheral) insufficiency 07/08/2011  . Closed fracture of intertrochanteric section of femur ([place]) 07/07/2011  . Dizziness and giddiness 06/23/2011  . Personal history of fall 06/23/2011  . Disturbance of skin sensation 05/26/2011  . Contusion of toe 05/26/2011  . Esophagitis, unspecified 04/14/2011  . Diaphragmatic hernia without mention of obstruction or gangrene 04/14/2011  . Loss of weight 04/14/2011  . Closed fracture of two ribs 04/14/2011  . Ulcer of esophagus 02/25/2011  . Chest pain, unspecified 02/10/2011  . Unspecified hereditary and idiopathic peripheral neuropathy 02/05/2011  . Scoliosis (and kyphoscoliosis), idiopathic   . Unspecified urinary incontinence 12/27/2010  . Closed fracture of rib(s), unspecified 12/27/2010  . Contracture of finger joint 07/25/2013    Left 2,3,4  fingers  . Hyperlipidemia    Past Surgical History  Procedure Laterality Date  . Appendectomy    . Tonsillectomy    . Anal fissuer    . Anal fissure repair    . Varicose vein surgery      Ligation of vein  . Tongue surgery    . Hand surgery    . Femur im nail  07/03/2011    Procedure: INTRAMEDULLARY (IM) NAIL FEMORAL;  Surgeon: Melina Schools, MD;   Location: [facility];  Service: Orthopedics;  Laterality: Left;  Synthes troch nail, jackson bed, c-arm  . Hip fracture surgery    . Breast surgery  1986    Left mastectomy  . Tongue surgery  2000    lump on back of tongue Dr. Owens Shark  . Cataract extraction w/ intraocular lens implant  2000    right  . Orif acetabular fracture  07/03/2011    left hip Dr. Rolena Infante  . Esophagogastroduodenoscopy endoscopy  [id number]    Dr. Cristina Gong with biopsy gastroesophageal junction mucosa with focal ulceration no intestinal metaplasia, dysphasia, or malignancy. Does have a hiatal hernia with esophagitis  . Colonoscopy w/ biopsies  02/21/2011    polyp-hyperplastic polyp, no adenomatous change or malignancy Dr. Cristina Gong   Social History:   reports that she has never smoked. She has never used smokeless tobacco. She reports that she drinks alcohol. She reports that she does not use illicit drugs.  Family History  Problem Relation Age of Onset  . Heart disease Mother   . Heart disease Brother   . Heart attack Brother   . Cancer Father     Liver cancer  . Diabetes Paternal Aunt   . COPD Brother   . Deep vein thrombosis Brother   . Varicose Veins Brother   . Hyperlipidemia Son     Medications: Patient's Medications  New Prescriptions   No medications on file  Previous Medications   AMBULATORY NON FORMULARY MEDICATION    Suction Aspirator Machine Use As Directed  Dx: G23.1 (PSP), R13.14 (Dysphagia)   ASPIRIN 81 MG TABLET    Take 81 mg by mouth daily. Reported on 03/16/2015   B COMPLEX VITAMINS CAPSULE    Take 1 capsule by mouth daily. Reported on 03/16/2015   CEFTRIAXONE (ROCEPHIN) 1 G INJECTION    Inject 1 g into the muscle daily.   CEFUROXIME (CEFTIN) 250 MG TABLET    Take 1 tablet (250 mg total) by mouth 2 (two) times daily with a meal.   CHOLECALCIFEROL (VITAMIN D3) LIQD    Take 1,000 Units by mouth daily. Reported on 03/16/2015   DENOSUMAB (PROLIA) 60 MG/ML SOLN INJECTION    Inject 60 mg into the skin every  6 (six) months. Reported on 03/16/2015   DONEPEZIL (ARICEPT) 5 MG TABLET    Take 1 tablet (5 mg total) by mouth at bedtime.   EPINEPHRINE 0.3 MG/0.3 ML IJ SOAJ INJECTION    Inject 0.3 mLs (0.3 mg total) into the muscle once.   FAMOTIDINE (PEPCID AC) 10 MG CHEWABLE TABLET    Chew 10 mg by mouth 2 (two) times daily as needed for heartburn. Reported on 03/16/2015   IPRATROPIUM-ALBUTEROL (DUONEB) 0.5-2.5 (3) MG/3ML SOLN    Take 3 mLs by nebulization every 6 (six) hours as needed (congestion, wheezing, coughing). Dx acute bronchitis with bronchospasm   OMEPRAZOLE (PRILOSEC) 40 MG CAPSULE    Take 40 mg by mouth 2 (two) times daily. Reported on 03/16/2015   ONDANSETRON (ZOFRAN ODT) 8  MG DISINTEGRATING TABLET    Take 1 tablet (8 mg total) by mouth every 8 (eight) hours as needed for nausea or vomiting.   PROBIOTIC PRODUCT (ALIGN PO)    Take 1 capsule by mouth daily. Reported on 03/16/2015   TRIAMCINOLONE CREAM (KENALOG) 0.1 %    Apply 1 application topically daily as needed (rash). Reported on 03/16/2015   VITAMIN B-12 (CYANOCOBALAMIN) 1000 MCG TABLET    Take 1,000 mcg by mouth daily. Reported on 03/16/2015  Modified Medications   No medications on file  Discontinued Medications   MUPIROCIN OINTMENT (BACTROBAN) 2 %    Apply 1 application topically 2 (two) times daily. APPLY TO AFFECTED AREAS AS NEEDED     Physical Exam: Filed Vitals:   03/16/15 1000  BP: 122/72  Pulse: 102  Temp: 99.2 F (37.3 C)  Resp: 20  SpO2: 91%    Physical Exam  Constitutional: No distress.  HENT:  Head: Normocephalic and atraumatic.  Mouth/Throat: Oropharynx is clear and moist. No oropharyngeal exudate.  Neck: No JVD present.  Cardiovascular: Normal rate and regular rhythm.   No murmur heard. No edema  Pulmonary/Chest: Effort normal and breath sounds normal. No respiratory distress.  Abdominal: Soft. Bowel sounds are normal. She exhibits no distension.  Musculoskeletal: She exhibits no edema or tenderness.  Rigidity to  upper and lower ext  Neurological: She is alert.  Not verbal, not able to f/c.  No obvious focal deficit. Reduced gag reflux  Skin: Skin is warm and dry. She is not diaphoretic.    Labs reviewed: Basic Metabolic Panel:  Recent Labs  03/06/15 2017 03/07/15 1225 03/09/15 0410  NA 141 142 140  K 3.7 3.7 4.4  CL 107 106 109  CO2 23 25 19*  GLUCOSE 104* 94 65  BUN 20 21* 10  CREATININE 0.63 0.68 0.52  CALCIUM 9.2 9.0 8.3*  MG  --  1.9 1.7   Liver Function Tests:  Recent Labs  03/28/14 1116 01/30/15 1233  AST 18 20  ALT 23 19  ALKPHOS 66 62  BILITOT 0.4 0.7  PROT 6.8 7.2  ALBUMIN 3.9 4.2   No results for input(s): LIPASE, AMYLASE in the last 8760 hours. No results for input(s): AMMONIA in the last 8760 hours. CBC:  Recent Labs  03/28/14 1116 10/26/14 1314  03/07/15 1225 03/08/15 0430 03/09/15 0410  WBC 5.6 4.9  < > 3.0* 3.6* 3.9*  NEUTROABS 2.6 2.2  --   --   --   --   HGB 14.4  --   < > 15.2* 12.6 13.0  HCT 41.0 42.3  < > 46.8* 38.7 40.1  MCV 89 91  < > 92.5 92.4 93.9  PLT 220 234  < > 163 148* 158  < > = values in this interval not displayed. TSH:  Recent Labs  03/28/14 1116 10/26/14 1314  TSH 2.180 2.700   A1C: No results found for: HGBA1C Lipid Panel:  Recent Labs  03/28/14 1116 10/26/14 1314  CHOL 273* 279*  HDL 49 48  LDLCALC 204* 214*  TRIG 102 83  CHOLHDL 5.6* 5.8*    Radiological Exams: 03/08/15: CXR: no acute cardiopulmonary findings  Assessment/Plan  1. PSP (progressive supranuclear palsy) (HCC) -periods of lethargy and wakefulness continue, more lethargy over time -decreased intake over time as well -I asked resident's son, Gaspar Bidding, to consider Hospice, however at this time he feels they have enough support with their home caregivers. I laid out various scenarios that he may find  hospice helpful. He let me know that he would reconsider if she does not eat more over the weekend. He is also not ready to commit to a no  hospitalizations order, but if she does not eat or drink over the weekend he may reconsider. -has needed oral suction at bedside  2. UTI (lower urinary tract infection) -has completed two doses of rocephin and continues with low grade temp -urine negative for growth at this point, would complete course and monitor -temp may be due to atelectasis or aspiration, however, lungs are clear without cough or purulent sputum  3. Decreased oral intake -has completed 2L of IVF, BMP unremarkable -will try puree diet with NTL when alert and upright -resident not able to work with speech  4. Xerostomia -biotene spray two sprays BID       Cindi Carbon, ANP [facility] [phone]

## 2015-03-20 ENCOUNTER — Encounter: Payer: Self-pay | Admitting: Internal Medicine

## 2015-03-20 ENCOUNTER — Non-Acute Institutional Stay (SKILLED_NURSING_FACILITY): Payer: Medicare Other | Admitting: Internal Medicine

## 2015-03-20 DIAGNOSIS — G231 Progressive supranuclear ophthalmoplegia [Steele-Richardson-Olszewski]: Secondary | ICD-10-CM

## 2015-03-20 DIAGNOSIS — R1314 Dysphagia, pharyngoesophageal phase: Secondary | ICD-10-CM | POA: Diagnosis not present

## 2015-03-20 DIAGNOSIS — N390 Urinary tract infection, site not specified: Secondary | ICD-10-CM | POA: Diagnosis not present

## 2015-03-20 DIAGNOSIS — R627 Adult failure to thrive: Secondary | ICD-10-CM

## 2015-03-20 DIAGNOSIS — R638 Other symptoms and signs concerning food and fluid intake: Secondary | ICD-10-CM | POA: Diagnosis not present

## 2015-03-20 DIAGNOSIS — R5382 Chronic fatigue, unspecified: Secondary | ICD-10-CM | POA: Diagnosis not present

## 2015-03-20 NOTE — Progress Notes (Signed)
Patient ID: Isabella George, female   DOB: 1933/01/17, 82 y.o.   MRN: [id number]   Location:  [facility] Provider:  Rexene Edison. Sena Hoopingarner, D.O., C.M.D. Hollace Kinnier, DO  Code Status:  DNR Goals of care: Advanced Directive information Does patient have an advance directive?: Yes, Type of Advance Directive: [place];Out of facility DNR (pink MOST or yellow form), Pre-existing out of facility DNR order (yellow form or pink MOST form): Yellow form placed in chart (order not valid for inpatient use)  Chief Complaint  Patient presents with  . Medical Management of Chronic Issues  . Discharge Note    d/c home when hoyer, suction kit and hospital bed in place  . goals of care    HPI:  Pt is a 82 y.o. white female seen today for possible d/c home from rehab, to further discuss the option of hospice care, and to review her medical conditions.  Her son notes she is still tachycardic and has had a low grade temp at times.  He requests nursing recheck HR manually tonight and monitor temp ongoing.  He again asked about IVFs/TPN and was counseled that this would prolong her life and require hospitalization (for tpn) which is not recommended at this point due to what she has told him in the past.  What he seems to struggle with is his perception that her quality of life is good b/c she has days where she smiles more, eats more, says a few more words and is more alert and other days where she is lethargic and does not eat at all or communicate whatsoever.  She had one great day 4 days ago and he's clinging to it.  We discussed how that is a natural trajectory for this illness.  We also discussed autonomic dysfunction as part of her neurologic disease contributing to her tachycardia.  He believes he has plenty of support with the current caregivers, but was educated that hospice can offer social and spiritual support.  The service may not even change the trajectory of her illness one way or the  other, or, could in fact, improve her day to day condition and quality of life by avoiding aggressive interventions she's been clear she would not have wanted to have done to her. After much discussion, Isabella George agreed to a hospice meeting.  Also, Isabella George cannot go home until the hoyer is in place for her safety and that of the staff.    Review of Systems  Unable to perform ROS: medical condition  Constitutional: Negative for chills.       T99 yesterday  pt with end stages of PSP and nonverbal most of the time  Past Medical History  Diagnosis Date  . Osteoporosis, senile   . Cancer [facility])     Breast Cancer  . Acid reflux   . Hip fracture ([place])   . Depressive disorder, not elsewhere classified 2012  . Anemia, unspecified   . Acute posthemorrhagic anemia 06/2011  . Malignant neoplasm of breast (female), unspecified site 15    s/p left mastectomy  . Sleep disturbance, unspecified   . Abnormality of gait   . Allergic rhinitis due to pollen   . Unspecified essential hypertension   . Restless leg syndrome 08/04/2012  . Osteoarthritis of both knees   . Rhinitis, non-allergic   . Memory loss   . Osteoarthrosis, unspecified whether generalized or localized, unspecified site 12/17/2011  . Debility, unspecified 07/25/2011  . Aneurysm of unspecified site (  [place]) 07/08/2011  . Unspecified venous (peripheral) insufficiency 07/08/2011  . Closed fracture of intertrochanteric section of femur ([place]) 07/07/2011  . Dizziness and giddiness 06/23/2011  . Personal history of fall 06/23/2011  . Disturbance of skin sensation 05/26/2011  . Contusion of toe 05/26/2011  . Esophagitis, unspecified 04/14/2011  . Diaphragmatic hernia without mention of obstruction or gangrene 04/14/2011  . Loss of weight 04/14/2011  . Closed fracture of two ribs 04/14/2011  . Ulcer of esophagus 02/25/2011  . Chest pain, unspecified 02/10/2011  . Unspecified hereditary and idiopathic peripheral neuropathy 02/05/2011  . Scoliosis (and  kyphoscoliosis), idiopathic   . Unspecified urinary incontinence 12/27/2010  . Closed fracture of rib(s), unspecified 12/27/2010  . 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 Hyperlipidemia    Past Surgical History  Procedure Laterality Date  . Appendectomy    . Tonsillectomy    . Anal fissuer    . Anal fissure repair    . Varicose vein surgery      Ligation of vein  . Tongue surgery    . Hand surgery    . Femur im nail  07/03/2011    Procedure: INTRAMEDULLARY (IM) NAIL FEMORAL;  Surgeon: Melina Schools, MD;  Location: [facility];  Service: Orthopedics;  Laterality: Left;  Synthes troch nail, jackson bed, c-arm  . Hip fracture surgery    . Breast surgery  1986    Left mastectomy  . Tongue surgery  2000    lump on back of tongue Dr. Owens Shark  . Cataract extraction w/ intraocular lens implant  2000    right  . Orif acetabular fracture  07/03/2011    left hip Dr. Rolena Infante  . Esophagogastroduodenoscopy endoscopy  [id number]    Dr. Cristina Gong with biopsy gastroesophageal junction mucosa with focal ulceration no intestinal metaplasia, dysphasia, or malignancy. Does have a hiatal hernia with esophagitis  . Colonoscopy w/ biopsies  02/21/2011    polyp-hyperplastic polyp, no adenomatous change or malignancy Dr. Cristina Gong    Allergies  Allergen Reactions  . Bee Venom Anaphylaxis  . Adhesive [Tape] Rash      Medication List       This list is accurate as of: 03/20/15 11:59 PM.  Always use your most recent med list.               ALIGN PO  Take 1 capsule by mouth daily. Reported on 03/16/2015     AMBULATORY NON FORMULARY MEDICATION  Suction Aspirator Machine Use As Directed  Dx: G23.1 (PSP), R13.14 (Dysphagia)     AMBULATORY NON [facility] Bed due to Progressive Supranuclear Palsy G23.1, Dysphagia R13.14 and Recurrent Aspiration     aspirin 81 MG tablet  Take 81 mg by mouth daily. Reported on 03/16/2015     b complex vitamins capsule  Take 1  capsule by mouth daily. Reported on 03/16/2015     cefUROXime 250 MG tablet  Commonly known as:  CEFTIN  Take 1 tablet (250 mg total) by mouth 2 (two) times daily with a meal.     denosumab 60 MG/ML Soln injection  Commonly known as:  PROLIA  Inject 60 mg into the skin every 6 (six) months. Reported on 03/16/2015     donepezil 5 MG tablet  Commonly known as:  ARICEPT  Take 1 tablet (5 mg total) by mouth at bedtime.     EPINEPHrine 0.3 mg/0.3 mL Soaj injection  Commonly known as:  EPI-PEN  Inject 0.3  mLs (0.3 mg total) into the muscle once.     famotidine 10 MG chewable tablet  Commonly known as:  PEPCID AC  Chew 10 mg by mouth 2 (two) times daily as needed for heartburn. Reported on 03/16/2015     ipratropium-albuterol 0.5-2.5 (3) MG/3ML Soln  Commonly known as:  DUONEB  Take 3 mLs by nebulization every 6 (six) hours as needed (congestion, wheezing, coughing). Dx acute bronchitis with bronchospasm     omeprazole 40 MG capsule  Commonly known as:  PRILOSEC  Take 40 mg by mouth 2 (two) times daily. Reported on 03/16/2015     ondansetron 8 MG disintegrating tablet  Commonly known as:  ZOFRAN ODT  Take 1 tablet (8 mg total) by mouth every 8 (eight) hours as needed for nausea or vomiting.     triamcinolone cream 0.1 %  Commonly known as:  KENALOG  Apply 1 application topically daily as needed (rash). Reported on 03/16/2015     vitamin B-12 1000 MCG tablet  Commonly known as:  CYANOCOBALAMIN  Take 1,000 mcg by mouth daily. Reported on 03/16/2015     Vitamin D3 Liqd  Take 1,000 Units by mouth daily. Reported on 03/16/2015        Immunization History  Administered Date(s) Administered  . Influenza Whole 12/09/2011, 12/08/2012  . Influenza-Unspecified 01/08/2014  . Pneumococcal Conjugate-13 10/11/2014  . Pneumococcal Polysaccharide-23 03/10/2000  . Td 03/11/2007   Pertinent  Health Maintenance Due  Topic Date Due  . INFLUENZA VACCINE  10/09/2014  . DEXA SCAN  Completed  . PNA vac  Low Risk Adult  Completed   Fall Risk  10/11/2014  Falls in the past year? No    Filed Vitals:   03/20/15 1514  BP: 131/73  Pulse: 73  Temp: 98.3 F (36.8 C)  Resp: 28  SpO2: 91%   There is no weight on file to calculate BMI. Physical Exam  Constitutional: No distress.  Seated in wheelchair asleep and cannot be awakened, would not even open eyes  Cardiovascular: Normal rate, regular rhythm, normal heart sounds and intact distal pulses.   Pulmonary/Chest: Effort normal and breath sounds normal.  Abdominal: Soft. Bowel sounds are normal.  Musculoskeletal:  Contractures of extremities  Neurological:  Unresponsive at present  Skin: Skin is warm and dry.    Labs reviewed:  Recent Labs  03/06/15 2017 03/07/15 1225 03/09/15 0410 03/14/15  NA 141 142 140 142  K 3.7 3.7 4.4 3.8  CL 107 106 109  --   CO2 23 25 19*  --   GLUCOSE 104* 94 65  --   BUN 20 21* 10 18  CREATININE 0.63 0.68 0.52 0.6  CALCIUM 9.2 9.0 8.3*  --   MG  --  1.9 1.7  --     Recent Labs  03/28/14 1116 01/30/15 1233 03/14/15  AST 18 20 29   ALT 23 19 26   ALKPHOS 66 62 55  BILITOT 0.4 0.7  --   PROT 6.8 7.2  --   ALBUMIN 3.9 4.2  --     Recent Labs  03/28/14 1116 10/26/14 1314  03/07/15 1225 03/08/15 0430 03/09/15 0410 03/14/15  WBC 5.6 4.9  < > 3.0* 3.6* 3.9* 7.7  NEUTROABS 2.6 2.2  --   --   --   --   --   HGB 14.4  --   < > 15.2* 12.6 13.0 14.6  HCT 41.0 42.3  < > 46.8* 38.7 40.1 44  MCV 89 91  < >  92.5 92.4 93.9  --   PLT 220 234  < > 163 148* 158 272  < > = values in this interval not displayed. Lab Results  Component Value Date   TSH 2.700 10/26/2014   No results found for: HGBA1C Lab Results  Component Value Date   CHOL 279* 10/26/2014   HDL 48 10/26/2014   LDLCALC 214* 10/26/2014   TRIG 83 10/26/2014   CHOLHDL 5.8* 10/26/2014    Significant Diagnostic Results in last 30 days:  Dg Chest 2 View  03/07/2015  CLINICAL DATA:  Cough x 2 days; pt's family member states that  pt has chronic cough which worsened 2 days ago; hx breast CA; EXAM: CHEST  2 VIEW COMPARISON:  01/30/2015. FINDINGS: Cardiomegaly. Severe thoracolumbar scoliosis convex RIGHT. LEFT mastectomy and axillary node dissection. Compressive atelectasis at the LEFT base due to elevated LEFT hemidiaphragm, but focal airspace disease not established. No evidence for failure or effusion. IMPRESSION: No active cardiopulmonary disease.  Stable exam. Electronically Signed   By: Staci Righter M.D.   On: 03/07/2015 13:58   Dg Chest Port 1 View  03/08/2015  CLINICAL DATA:  Cough. EXAM: PORTABLE CHEST 1 VIEW COMPARISON:  March 07, 2015. FINDINGS: Stable cardiomegaly. No pneumothorax or significant pleural effusion is noted. Left axillary surgical clips are noted. Stable dextroscoliosis of lower thoracic spine is noted. No acute pulmonary disease is noted. IMPRESSION: No acute cardiopulmonary abnormality seen. Electronically Signed   By: Marijo Conception, M.D.   On: 03/08/2015 09:59    Assessment/Plan 1. UTI (lower urinary tract infection) -should be resolved as susceptible to abx given -has been hydrated but po intake remains abismal  2. PSP (progressive supranuclear palsy) ([place]) -end stages -await hospice consult with her son to help further with goals of care--would like to avoid hospitalizations for pt's qol -dependent in all adls, nonambulatory, nonverbal for most part and some days without oral intake  3. Decreased oral intake -counseled that IVF or TPN would prolong life but not fix the baseline problem which is her PSP  4. Adult failure to thrive syndrome -due to #2 -comfort measures  5. Dysphagia, pharyngoesophageal phase -need suction machine at home to help prevent aspiration and hospital bed for positional changes b/c pt unable to make them herself due to her PSP  6. Chronic fatigue -due to PSP, end stage disease, lack of intake  Family/ staff Communication: more than 30 mins were spent with  pt's son Isabella George discussing her goals of care--he is now agreeable to a hospice referral to assist in his mother's care at home ; an additional 45 mins were spent completing pt's physical exam and coordinating care for DME, discussing with nursing staff  Labs/tests ordered:  No new Gladys Deckard L. Zayquan Bogard, D.O. [facility] [address], [place] [address] Cell Phone (Mon-Fri 8am-5pm):  [phone] On Call:  [phone] & follow prompts after 5pm & weekends Office Phone:  [phone] Office Fax:  [phone]

## 2015-03-20 NOTE — Telephone Encounter (Signed)
[facility] faxed and stated that they do not send out Hospital beds.  Son notified and requested to be sent to [facility]. [facility] Bed order to [facility] Fax#: [phone]

## 2015-03-21 ENCOUNTER — Telehealth: Payer: Self-pay | Admitting: *Deleted

## 2015-03-21 NOTE — Telephone Encounter (Signed)
Received a call from [facility] with [facility] [phone] requesting more information regarding [employer]. Needs NPI written on original Rx and needs OV notes and a Dr. Serena Croissant. Fax received and Notes Printed, NPI written and left for Dr. Mariea Clonts to review and sign. To be faxed back to [facility] at [place] [phone]

## 2015-03-23 ENCOUNTER — Telehealth: Payer: Self-pay | Admitting: *Deleted

## 2015-03-23 NOTE — Telephone Encounter (Signed)
Yes, I will be attending. 

## 2015-03-23 NOTE — Telephone Encounter (Signed)
Isabella George with Hospice called and stated that patient was discharged home from Rehab with Hospice and they want to know if you are going to be the Attending Physician for Hospice orders. Please Advise.

## 2015-03-26 NOTE — Telephone Encounter (Signed)
Isabella George with Hospice Notified

## 2015-04-11 ENCOUNTER — Encounter: Payer: Medicare Other | Admitting: Internal Medicine

## 2015-04-16 NOTE — Telephone Encounter (Signed)
Phone call never returned

## 2015-05-01 ENCOUNTER — Ambulatory Visit (INDEPENDENT_AMBULATORY_CARE_PROVIDER_SITE_OTHER): Admitting: Neurology

## 2015-05-01 ENCOUNTER — Encounter: Payer: Self-pay | Admitting: Neurology

## 2015-05-01 VITALS — BP 100/68 | HR 91 | Resp 12

## 2015-05-01 DIAGNOSIS — N3942 Incontinence without sensory awareness: Secondary | ICD-10-CM

## 2015-05-01 DIAGNOSIS — G231 Progressive supranuclear ophthalmoplegia [Steele-Richardson-Olszewski]: Secondary | ICD-10-CM | POA: Diagnosis not present

## 2015-05-01 DIAGNOSIS — M6289 Other specified disorders of muscle: Secondary | ICD-10-CM | POA: Diagnosis not present

## 2015-05-01 DIAGNOSIS — R531 Weakness: Secondary | ICD-10-CM

## 2015-05-01 NOTE — Progress Notes (Signed)
Isabella George was seen today in the movement disorders clinic for neurologic consultation at the request of REED, TIFFANY, DO.  The consultation is for the evaluation of gait changes.  Pt is accompanied by her son as well as a caregiver,  who supplements the history.  The patient has previously seen [name], and I have reviewed records all the way back to 1998.  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  Pt doesn't remember what exactly what happened in 2006 but her son thinks that the primary issue was being off balance and insomnia.  In 2008, she had a significant fall with a sternum fx according to her son.  She required a walker full time by 2012, when she moved to [facility].  She broke her hip in 2013 from a fall.  The walker seemed to only make falls worse.    She has had multiple sleep studies and those demonstrated PLM's.  She has had EDS and that has been one of her bigger c/o and that started in 2006.  It appears that bladder incontinence occurred early on as well but pt denies that and states that it was incorrect.  Her son does state that bladder incontinence may have started in 2008 but she has significant and complete bladder incontinence and over the last year, has had bowel incontinence as well.  Pt could intermittently walk with a platform walker and gait belt over the summer but now is basically WC bound.    Pt and son noted that there was a weakness in the L side/arm about a year ago.  She did fall and break her hand with multiple surgeries in 2009 but seemed to do well after that, but the thumb never completely recovered function.  There is no leg weakness.  The caregiver said that she does drool out the L side of the mouth with eating.  She went to [facility] earlier this year and saw Dr. [name] for Botox.  They do not know if they got a  diagnosis there but state that the botox did not help the clenched fist.  She only had it one time and was told if it didn't help then, no need to proceed further.  03/21/14 update:  Pt returns today for follow up.  She is accompanied by 2 caregivers and multiple questions from her son who is unable to attend the visit.  There are also questions from an occupational therapist that was written down.  Pt had a MBE on 02/01/14 and mechanical soft diet with thin liquids between meals were recommended but nectar thick liquids with the meals due to pharyngeal residuals.  They are following this.  Referral for ST/PT.  They are using an OT/yoga therapist.   Pts caregiver asks about using mucinex and was told to ask about a referral to pulmonology.    05/01/15 update: The patient follows up today, accompanied by her son and a caregiver who supplement the history.  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  She is on Aricept for dementia and hallucinations.  Son states that it is more difficult to tell now if she has hallucinations because of inability to communicate well.  She had a MBE for 10/13/14 that demonstrated moderate oral and pharyngeal phase dysphagia and mechanical soft with thin liquids  was recommended. Son states that she had more trouble in Dec/Jan but that was when she had a UTI.  Once she recovered from that, she did much better but never returned to baseline like she was last summer.  They are using a suction aspirator. There are full time caregivers, and sometimes 2 around.   Her son may need a hospital bed; virtually all her feeding is in bed because she falls asleep in the wheelchair more than in the bed.  She spends 95% of the day in the bed, especially because she is fed in the bed and needs to be upright to prevent aspiration with this.  She cannot move in order to change her body position in bed.   They are having the patient evaluated for a communication device as she has  difficulty communicating with family.  She sometimes can blink to communicate when she has trouble speaking.   WC break on the L doesn't work.    PREVIOUS MEDICATIONS:   The patient has been on multiple medications in the past, either for restless leg syndrome, the "gait disorder" or excessive daytime hypersomnolence.  Some of these medications include melatonin, clonazepam, Mirapex, levodopa, gabapentin, Requip (for restless leg responses and nuvigil (made the patient manic)  ALLERGIES:   Allergies  Allergen Reactions  . Bee Venom Anaphylaxis  . Adhesive [Tape] Rash    CURRENT MEDICATIONS:  Outpatient Encounter Prescriptions as of 05/01/2015  Medication Sig  . AMBULATORY NON FORMULARY MEDICATION Suction Aspirator Machine Use As Directed  Dx: G23.1 (PSP), R13.14 (Dysphagia) (Patient not taking: Reported on 03/16/2015)  . AMBULATORY NON [facility] Bed due to Progressive Supranuclear Palsy G23.1, Dysphagia R13.14 and Recurrent Aspiration  . aspirin 81 MG tablet Take 81 mg by mouth daily. Reported on 03/16/2015  . b complex vitamins capsule Take 1 capsule by mouth daily. Reported on 03/16/2015  . cefUROXime (CEFTIN) 250 MG tablet Take 1 tablet (250 mg total) by mouth 2 (two) times daily with a meal. (Patient not taking: Reported on 03/16/2015)  . Cholecalciferol (VITAMIN D3) LIQD Take 1,000 Units by mouth daily. Reported on 03/16/2015  . denosumab (PROLIA) 60 MG/ML SOLN injection Inject 60 mg into the skin every 6 (six) months. Reported on 03/16/2015  . donepezil (ARICEPT) 5 MG tablet Take 1 tablet (5 mg total) by mouth at bedtime. (Patient not taking: Reported on 03/16/2015)  . EPINEPHrine 0.3 mg/0.3 mL IJ SOAJ injection Inject 0.3 mLs (0.3 mg total) into the muscle once. (Patient not taking: Reported on 03/16/2015)  . famotidine (PEPCID AC) 10 MG chewable tablet Chew 10 mg by mouth 2 (two) times daily as needed for heartburn. Reported on 03/16/2015  . ipratropium-albuterol (DUONEB)  0.5-2.5 (3) MG/3ML SOLN Take 3 mLs by nebulization every 6 (six) hours as needed (congestion, wheezing, coughing). Dx acute bronchitis with bronchospasm  . omeprazole (PRILOSEC) 40 MG capsule Take 40 mg by mouth 2 (two) times daily. Reported on 03/16/2015  . ondansetron (ZOFRAN ODT) 8 MG disintegrating tablet Take 1 tablet (8 mg total) by mouth every 8 (eight) hours as needed for nausea or vomiting. (Patient not taking: Reported on 03/16/2015)  . Probiotic Product (ALIGN PO) Take 1 capsule by mouth daily. Reported on 03/16/2015  . triamcinolone cream (KENALOG) 0.1 % Apply 1 application topically daily as needed (rash). Reported on 03/16/2015  . vitamin B-12 (CYANOCOBALAMIN) 1000 MCG tablet Take 1,000 mcg by mouth daily. Reported on 03/16/2015   No facility-administered encounter medications on file as of  05/01/2015.    PAST MEDICAL HISTORY:   Past Medical History  Diagnosis Date  . Osteoporosis, senile   . Cancer [facility])     Breast Cancer  . Acid reflux   . Hip fracture ([place])   . Depressive disorder, not elsewhere classified 2012  . Anemia, unspecified   . Acute posthemorrhagic anemia 06/2011  . Malignant neoplasm of breast (female), unspecified site 42    s/p left mastectomy  . Sleep disturbance, unspecified   . Abnormality of gait   . Allergic rhinitis due to pollen   . Unspecified essential hypertension   . Restless leg syndrome 08/04/2012  . Osteoarthritis of both knees   . Rhinitis, non-allergic   . Memory loss   . Osteoarthrosis, unspecified whether generalized or localized, unspecified site 12/17/2011  . Debility, unspecified 07/25/2011  . Aneurysm of unspecified site ([place]) 07/08/2011  . Unspecified venous (peripheral) insufficiency 07/08/2011  . Closed fracture of intertrochanteric section of femur ([place]) 07/07/2011  . Dizziness and giddiness 06/23/2011  . Personal history of fall 06/23/2011  . Disturbance of skin sensation 05/26/2011  . Contusion of toe 05/26/2011  . Esophagitis,  unspecified 04/14/2011  . Diaphragmatic hernia without mention of obstruction or gangrene 04/14/2011  . Loss of weight 04/14/2011  . Closed fracture of two ribs 04/14/2011  . Ulcer of esophagus 02/25/2011  . Chest pain, unspecified 02/10/2011  . Unspecified hereditary and idiopathic peripheral neuropathy 02/05/2011  . Scoliosis (and kyphoscoliosis), idiopathic   . Unspecified urinary incontinence 12/27/2010  . Closed fracture of rib(s), unspecified 12/27/2010  . 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 Hyperlipidemia     PAST SURGICAL HISTORY:   Past Surgical History  Procedure Laterality Date  . Appendectomy    . Tonsillectomy    . Anal fissuer    . Anal fissure repair    . Varicose vein surgery      Ligation of vein  . Tongue surgery    . Hand surgery    . Femur im nail  07/03/2011    Procedure: INTRAMEDULLARY (IM) NAIL FEMORAL;  Surgeon: Melina Schools, MD;  Location: [facility] ORS;  Service: Orthopedics;  Laterality: Left;  Synthes troch nail, jackson bed, c-arm  . Hip fracture surgery    . Breast surgery  1986    Left mastectomy  . Tongue surgery  2000    lump on back of tongue Dr. Owens Shark  . Cataract extraction w/ intraocular lens implant  2000    right  . Orif acetabular fracture  07/03/2011    left hip Dr. Rolena Infante  . Esophagogastroduodenoscopy endoscopy  [id number]    Dr. Cristina Gong with biopsy gastroesophageal junction mucosa with focal ulceration no intestinal metaplasia, dysphasia, or malignancy. Does have a hiatal hernia with esophagitis  . Colonoscopy w/ biopsies  02/21/2011    polyp-hyperplastic polyp, no adenomatous change or malignancy Dr. Cristina Gong    SOCIAL HISTORY:   Social History   Social History  . Marital Status: Widowed    Spouse Name: N/A  . Number of Children: N/A  . Years of Education: N/A   Occupational History  . Not on file.   Social History Main Topics  . Smoking status: Never Smoker   . Smokeless tobacco: Never Used  . Alcohol Use:  0.0 oz/week    0 Standard drinks or equivalent per week     Comment: rare  . Drug Use: No  . Sexual Activity: No   Other Topics Concern  .  Not on file   Social History Narrative   Lives [place] [address]   Patient is widowed.    Patient has 2 children.   Patient has 3 years or college.   Patient is retired.    Never smoked   Alcohol none   Exercise none          FAMILY HISTORY:   Family Status  Relation Status Death Age  . Mother Deceased     CAD  . Brother Alive     CAD  . Father Deceased     liver CA  . Brother Alive     COPD  . Son Alive   . Son Alive     ROS:  A complete 10 system review of systems was obtained and was unremarkable apart from what is mentioned above.  PHYSICAL EXAMINATION:    VITALS:   Filed Vitals:   05/01/15 1517  BP: 100/68  Pulse: 91  Resp: 12  SpO2: 98%    GEN:  The patient appears stated age and is in NAD. HEENT:  Normocephalic, atraumatic.  The mucous membranes are moist. The superficial temporal arteries are without ropiness or tenderness. CV:  RRR Lungs:  CTAB Neck/HEME:  There are no carotid bruits bilaterally.  Neurological examination:  Orientation: The patient does not participate with any questions of orientation and does not interact with the examiner today Cranial nerves: There is L facial droop (lower facial).  Forehead wrinkle symmetric.  The patient does not participate with extraocular muscle testing.  She does not speak at all, nor does she attempts speech.   Sensation: Does not participate with sensory testing. Motor: She does not voluntarily move the right or left hand (caregiver state that she never moves either arm).  Deep tendon reflexes: Not tested today  Movement examination: Tone: There is a complete flexion contracture of the left arm and the left arm is held at 90 and cannot be removed.  The right arm also has a component of spasticity, but it is able to be moved. Abnormal movements:  none Coordination: She does not participate at all with movements of coordination. Gait and Station: The patient is unable to get out of the wheelchair.  Family states that she cannot walk, but she can stand on her feet with transfer  ASSESSMENT/PLAN:  1.  Long-term parkinsonism, now with bulbar speech, eyelid opening apraxia, vertical gaze paresis  -Suspect that the patient has progressive supranuclear palsy but CBGD is certainly in the Ddx.  This has been going on for quite some time.  Strongly suspect that this is now complicated by cerebral infarction, given thefacial droop and left contractures.  MRI was negative for infarct but still suspect that is the case given hx that L side was acute.    -Son asked me today if Botox would be of value to the left arm.  I told him it would no longer be of value, as the left arm now has contractures, as opposed to just spasticity.  -They will let me know if she needs a new prescription for a hospital bed.   she spends 95% of time in bed and needs to be elevated due to significant aspiration risk because of PSP.  Even her own secretions provide a risk.  Bed wedges do not provide adequate elevation to resolve issues with aspiration, in addition to the fact that she slides off of these wedges easily and cannot reposition herself.  She is completely paralyzed on one side  of the body and is rigid on the other side.  -will write an order for OT to see if there is a device that can be placed over the L arm/wrist to prevent continued tendon shortening/contracture.    -I think she potentially would be a good hospice candidate, but family is paying out of pocket for full-time caregivers and are very happy with the caregiving that she is getting.  -Son thinks that she potentially needs a new wheelchair, as the break no longer works on the left side and the part is no longer available.  If so, she will need a mobility evaluation, as they do not want to get the chair in the  next 30 days. 2.  Dysphagia.  -She had a modified barium swallow on 02/01/2014 and mechanical soft with thin liquids between meals and nectar thick during meals was recommended.  She is intermittently following this diet.  Talked about her aspiration risks with her and her caregivers.  They understand that and are balance and quality of life issues with aspiration risk. 3.  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  She has 24-hour per day care.  She is on Aricept. 4.  Bladder and bowel incontinence.  -she is at very high risk for sepsis/urosepsis.  Caregivers are doing a great job caring for her currently. 5.  Follow-up as needed.  I spent much greater than 50% of this 50 minute visit in counseling with the patient and family.

## 2015-05-04 ENCOUNTER — Telehealth: Payer: Self-pay | Admitting: Neurology

## 2015-05-04 NOTE — Telephone Encounter (Signed)
Pt son [employer] called and states that we were to do rx for soft splint for arm and he did not get a rx please call [phone]

## 2015-05-07 NOTE — Telephone Encounter (Signed)
Patient's son made aware referral was made to OT at [facility] for them to evaluate a device to help arm. They left message for them to schedule an appt. Number provided to son to call them back.

## 2015-05-15 ENCOUNTER — Telehealth: Payer: Self-pay | Admitting: Neurology

## 2015-05-15 NOTE — Telephone Encounter (Signed)
Isabella George with Hospice called [phone]) to let us know that patient is being evaluated by speech therapy for communication device but this would not be covered under medicare or hospice. They are making family aware they would have to pay out of pocket for the device. FYI.

## 2015-05-22 ENCOUNTER — Ambulatory Visit: Attending: Neurology | Admitting: Occupational Therapy

## 2015-05-22 ENCOUNTER — Encounter: Payer: Self-pay | Admitting: Occupational Therapy

## 2015-05-22 DIAGNOSIS — M62838 Other muscle spasm: Secondary | ICD-10-CM

## 2015-05-22 DIAGNOSIS — M25641 Stiffness of right hand, not elsewhere classified: Secondary | ICD-10-CM | POA: Diagnosis present

## 2015-05-22 DIAGNOSIS — M6249 Contracture of muscle, multiple sites: Secondary | ICD-10-CM | POA: Diagnosis present

## 2015-05-22 DIAGNOSIS — M25642 Stiffness of left hand, not elsewhere classified: Secondary | ICD-10-CM | POA: Diagnosis present

## 2015-05-22 NOTE — Therapy (Signed)
[facility] [address] [place], [place], [address] Phone: [phone]   Fax:  [phone]  Occupational Therapy Evaluation  Patient Details  Name: Isabella George MRN: [id number] Date of Birth: [birth year] Referring Provider: Wells Guiles Tat  Encounter Date: 05/22/2015      OT End of Session - 05/22/15 1627    Visit Number 1   Number of Visits 4   Date for OT Re-Evaluation 07/17/15   Authorization Type Medicare, will need G code and PN every 10th visit   Authorization Time Period 60 days   Authorization - Visit Number 1   Authorization - Number of Visits 4   OT Start Time [id number]   OT Stop Time 1530   OT Time Calculation (min) 43 min   Activity Tolerance No increased pain      Past Medical History  Diagnosis Date  . Osteoporosis, senile   . Cancer [facility])     Breast Cancer  . Acid reflux   . Hip fracture ([place])   . Depressive disorder, not elsewhere classified 2012  . Anemia, unspecified   . Acute posthemorrhagic anemia 06/2011  . Malignant neoplasm of breast (female), unspecified site 82    s/p left mastectomy  . Sleep disturbance, unspecified   . Abnormality of gait   . Allergic rhinitis due to pollen   . Unspecified essential hypertension   . Restless leg syndrome 08/04/2012  . Osteoarthritis of both knees   . Rhinitis, non-allergic   . Memory loss   . Osteoarthrosis, unspecified whether generalized or localized, unspecified site 12/17/2011  . Debility, unspecified 07/25/2011  . Aneurysm of unspecified site ([place]) 07/08/2011  . Unspecified venous (peripheral) insufficiency 07/08/2011  . Closed fracture of intertrochanteric section of femur ([place]) 07/07/2011  . Dizziness and giddiness 06/23/2011  . Personal history of fall 06/23/2011  . Disturbance of skin sensation 05/26/2011  . Contusion of toe 05/26/2011  . Esophagitis, unspecified 04/14/2011  . Diaphragmatic hernia without mention of obstruction or gangrene  04/14/2011  . Loss of weight 04/14/2011  . Closed fracture of two ribs 04/14/2011  . Ulcer of esophagus 02/25/2011  . Chest pain, unspecified 02/10/2011  . Unspecified hereditary and idiopathic peripheral neuropathy 02/05/2011  . Scoliosis (and kyphoscoliosis), idiopathic   . Unspecified urinary incontinence 12/27/2010  . Closed fracture of rib(s), unspecified 12/27/2010  . 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 Hyperlipidemia     Past Surgical History  Procedure Laterality Date  . Appendectomy    . Tonsillectomy    . Anal fissuer    . Anal fissure repair    . Varicose vein surgery      Ligation of vein  . Tongue surgery    . Hand surgery    . Femur im nail  07/03/2011    Procedure: INTRAMEDULLARY (IM) NAIL FEMORAL;  Surgeon: Melina Schools, MD;  Location: [facility];  Service: Orthopedics;  Laterality: Left;  Synthes troch nail, jackson bed, c-arm  . Hip fracture surgery    . Breast surgery  1986    Left mastectomy  . Tongue surgery  2000    lump on back of tongue Dr. Owens Shark  . Cataract extraction w/ intraocular lens implant  2000    right  . Orif acetabular fracture  07/03/2011    left hip Dr. Rolena Infante  . Esophagogastroduodenoscopy endoscopy  [id number]    Dr. Cristina Gong with biopsy gastroesophageal junction mucosa with focal ulceration no intestinal metaplasia, dysphasia,  or malignancy. Does have a hiatal hernia with esophagitis  . Colonoscopy w/ biopsies  02/21/2011    polyp-hyperplastic polyp, no adenomatous change or malignancy Dr. Cristina Gong    There were no vitals filed for this visit.  Visit Diagnosis:  Stiffness of joint, hand, left - Plan: Ot plan of care cert/re-cert  Stiffness of joint, hand, right - Plan: Ot plan of care cert/re-cert  Muscle spasticity - Plan: Ot plan of care cert/re-cert      Subjective Assessment - 05/22/15 1455    Patient is accompained by: Family member  son Aaron Edelman, 2 caregivers, yoga therapist   Pertinent History see epic    Patient Stated Goals son states they are hoping to prevent further contacture to hands/wrists   Currently in Pain? No/denies  pt is non verbal- son reports no real pain with pt from what they can tell -  occassionally pain with knee,back and right wrist.            [facility] OT Assessment - 05/22/15 0001    Assessment   Diagnosis Contracture of both hands, severe dementia, long standing parkinson's   Referring Provider Wells Guiles Tat   Onset Date --  2013 - pt fell and has had rapid deterioration since then   Precautions   Precautions None   Restrictions   Weight Bearing Restrictions No   Balance Screen   Has the patient fallen in the past 6 months No   Home  Environment   Family/patient expects to be discharged to: Private residence   [place]  24 hour care with aides as well   Type of [place] Two level   Additional Comments Pt lives primarily on first floor and is totally dependent for all care.    Prior Function   Level of Independence Needs assistance with ADLs;Needs assistance with homemaking  pt has requred assistance for several years.   ADL   ADL comments Pt has advanced dementia, long standing parkinson's, probable CVA, primarily non verbal and needs assistance with all activities   Mobility   Mobility Status Needs assist   Written Expression   Dominant Hand Right   Vision - History   Baseline Vision Other (comment)  sensitivity to light;u/a to assess vision at this point   Cognition   Overall Cognitive Status History of cognitive impairments - at baseline  advanced dementia   Sensation   Additional Comments unable to assess sensation due to severe cognitive deficits and advanced dementia.  Will need to monitor any positioning device recommended carefully - son and caregivers able to verbalize understanding.    Tone   Assessment Location Left Upper Extremity   ROM / Strength   AROM / PROM / Strength PROM;Strength   PROM   Overall  PROM  Deficits   Overall PROM Comments Pt with severe spasticity in LUE and moderate in RUE. Pt signiciantly limited in all shoulder motion in BUE's.  Pt  with elbow contracture at approximately 130* and finger contratures in both hands.  Pt has no pasive wrist extension and severely reduced passive wrist flexion however family  and aides report pt will posture into flexion/radial deviation at times and can sometimes appear in pain from this.     Strength   Overall Strength Unable to assess   Overall Strength Comments U/a to asses due to spasticity and severely impaired cognition. Pt unable to use either UE functionally for any activity.   Hand Function   Right  Hand Gross Grasp Impaired   Left Hand Gross Grasp Impaired   Comment Pt is unable to use either hand/arm functionally   LUE Tone   LUE Tone Severe                              OT Long Term Goals - 05/24/2015 1612    OT LONG TERM GOAL #1   Title Pt's family will demosntrate understanding of wear and care of bilateral splints to prevent further contracture to wrist and hands - 06/19/2015   Status New   OT LONG TERM GOAL #2   Title Pt's aides and son will demonstrate ability to don and doff splints    Status New   OT LONG TERM GOAL #3   Title Pt's son and caregivers witll verbalize understanding of how to monitor splint tolerance   Status New   OT LONG TERM GOAL #4   Title Pt will tolerate splints for at least 4 hours 2x/day.to prevent further contractures/skin breakdown   Status New   OT LONG TERM GOAL #5   Title Pt's son and caregivers will demosntrate understanding of bed positioning to assist in reducing tone in UE's as well as reduce risk of futher contracture.   Status New               Plan - 24-May-2015 1615    Clinical Impression Statement Pt is an 82 year old femaile with advanced dementia, long standing Parkinson's, probable CVA,  who was referred for L hand weakness/possible splints to prevent  further contracture. Pt currently is predominantly non verbal, and is not able to use UE's or hands in any functional capacity.  Pt will benefit from a short course of skilled OT to address fabrication of custom splints and education of son and caregivers to prevent further contactures/skin breakdown/pain in B hands. Pt's son in agreement - pt unable to verbalize.    Pt will benefit from skilled therapeutic intervention in order to improve on the following deficits (Retired) Decreased range of motion;Impaired UE functional use;Impaired tone;Impaired flexibility;Pain   Rehab Potential Fair   Clinical Impairments Affecting Rehab Potential severe cognitive deficits, primarily non verbal, unable to determine sensation, at risk for breakdown and futher contracture   OT Frequency 1x / week   OT Duration 4 weeks   OT Treatment/Interventions Therapeutic exercise;Manual Therapy;Passive range of motion;Splinting;Patient/family education;Moist Heat   Plan initate fabrication of custom splints for both hands   Consulted and Agree with Plan of Care Patient;Family member/caregiver   Family Member Consulted son          G-Codes - 05/24/15 1631    Functional Assessment Tool Used skilled clinical observation   Functional Limitation Self care  caregiver for wear and care of splints for pt   Self Care Current Status [phone]) 100 percent impaired, limited or restricted   Self Care Goal Status [id number]) 0 percent impaired, limited or restricted  caregiver wear and care of splints for pt      Problem List Patient Active Problem List   Diagnosis Date Noted  . Palliative care encounter 03/09/2015  . DNR (do not resuscitate) 03/09/2015  . Dysphagia   . Acute encephalopathy   . UTI (lower urinary tract infection) 03/07/2015  . PSP (progressive supranuclear palsy) ([place]) 03/07/2015  . Parkinsonism ([place]) 03/07/2015  . Hyperlipidemia   . Apgar appearance score 1, pink body, bluish hands or feet 05/09/2014  .  Cough 04/05/2014  . Dysphagia, pharyngoesophageal phase 10/25/2013  . Contracture of finger joint 07/25/2013  . Muscle spasticity 05/20/2013  . Mild cognitive impairment with memory loss 12/23/2012  . Encounter for long-term (current) use of other medications 12/22/2012  . Rhinitis, non-allergic   . Osteoarthritis of both knees   . Restless leg syndrome 08/04/2012  . Sleep disturbance, unspecified 06/23/2012  . Depressive disorder, not elsewhere classified 06/23/2012  . Essential hypertension   . Hip fracture ([place])   . Fracture of left hip ([place]) 07/03/2011  . Osteoporosis 07/03/2011  . Breast cancer ([place]) 07/03/2011  . Cancer ([place])   . Acid reflux   . Abnormality of gait 06/24/2007    Quay Burow, OTR/L 05/22/2015, 4:42 PM  [facility] [address] [place], [place], [address] Phone: [phone]   Fax:  [phone]  Name: ARYELLA MAY MRN: [id number] Date of Birth: May 27, 1932

## 2015-05-23 ENCOUNTER — Other Ambulatory Visit: Payer: BLUE CROSS/BLUE SHIELD

## 2015-05-25 ENCOUNTER — Encounter: Payer: BLUE CROSS/BLUE SHIELD | Admitting: Internal Medicine

## 2015-05-29 ENCOUNTER — Encounter: Payer: Self-pay | Admitting: Occupational Therapy

## 2015-05-29 ENCOUNTER — Ambulatory Visit: Admitting: Occupational Therapy

## 2015-05-29 DIAGNOSIS — M62838 Other muscle spasm: Secondary | ICD-10-CM

## 2015-05-29 DIAGNOSIS — M25642 Stiffness of left hand, not elsewhere classified: Secondary | ICD-10-CM

## 2015-05-29 DIAGNOSIS — M25641 Stiffness of right hand, not elsewhere classified: Secondary | ICD-10-CM

## 2015-05-29 NOTE — Patient Instructions (Signed)
Your Splint This splint should initially be fitted by a healthcare practitioner.  The healthcare practitioner is responsible for providing wearing instructions and precautions to the patient, other healthcare practitioners and care provider involved in the patient's care.  This splint was custom made for you. Please read the following instructions to learn about wearing and caring for your splint.  Precautions Should your splint cause any of the following problems, remove the splint immediately and contact your therapist/physician.  Swelling  Severe Pain  Pressure Areas  Stiffness  Numbness  Do not wear your splint while operating machinery unless it has been fabricated for that purpose.  When To Wear Your Splint Where your splint according to your therapist/physician instructions. Patient will wear splint for shorter periods of time when building up tolerance.  Please build up tolerance over at least one week as follows: Day 1:  Wear for 20 minutes on, 2 hours off during the day only.  You will want to work around the time that you are providing bathing and dressing assistance as well as transfers as the splints will make these activities much more difficult. Day 2: Wear for 30 minutes on, 2 hours off during the day only. Day 3: Wear for 45 minutes on, 2 hours off during the day only. Day 4:  Wear for an hour on, 2 hours off during the day only Day 5: Wear for 75 minutes on, 2 hours off during the day only. Day 6: Wear for 90 minutes on, 2 hours off during the day only Day 7; Wear 2 hours on, 2 hours off during the day only.  This will be her permanent wearing schedule.  Daytime for 2 hours and Remove splint every 2 hours - you will need to always monitor her skin, even once her tolerance is built up.   Care and Cleaning of Your Splint 1. Keep your splint away from open flames. 2. Your splint will lose its shape in temperatures over 135 degrees Farenheit, ( in car windows, near  radiators, ovens or in hot water).  Never make any adjustments to your splint, if the splint needs adjusting remove it and make an appointment to see your therapist. 3. Your splint, including the cushion liner may be cleaned with soap and lukewarm water.  Do not immerse in hot water over 135 degrees Farenheit. 4. Straps may be washed with soap and water, but do not moisten the self-adhesive portion. 5. For ink or hard to remove spots use a scouring cleanser which contains chlorine.  Rinse the splint thoroughly after using chlorine cleanser. 6.

## 2015-05-29 NOTE — Therapy (Signed)
[facility] [address] [place], [place], [address] Phone: [phone]   Fax:  [phone]  Occupational Therapy Treatment  Patient Details  Name: Isabella George MRN: [id number] Date of Birth: [birth year] Referring Provider: Wells Guiles Tat  Encounter Date: 05/29/2015      OT End of Session - 05/29/15 1704    Visit Number 2   Number of Visits 4   Date for OT Re-Evaluation 07/17/15   Authorization Type Medicare, will need G code and PN every 10th visit   Authorization Time Period 60 days   Authorization - Visit Number 2   Authorization - Number of Visits 4   OT Start Time 1535  pt arrived late   OT Stop Time 1615   OT Time Calculation (min) 40 min   Activity Tolerance Patient tolerated treatment well      Past Medical History  Diagnosis Date  . Osteoporosis, senile   . Cancer [facility])     Breast Cancer  . Acid reflux   . Hip fracture ([place])   . Depressive disorder, not elsewhere classified 2012  . Anemia, unspecified   . Acute posthemorrhagic anemia 06/2011  . Malignant neoplasm of breast (female), unspecified site 73    s/p left mastectomy  . Sleep disturbance, unspecified   . Abnormality of gait   . Allergic rhinitis due to pollen   . Unspecified essential hypertension   . Restless leg syndrome 08/04/2012  . Osteoarthritis of both knees   . Rhinitis, non-allergic   . Memory loss   . Osteoarthrosis, unspecified whether generalized or localized, unspecified site 12/17/2011  . Debility, unspecified 07/25/2011  . Aneurysm of unspecified site ([place]) 07/08/2011  . Unspecified venous (peripheral) insufficiency 07/08/2011  . Closed fracture of intertrochanteric section of femur ([place]) 07/07/2011  . Dizziness and giddiness 06/23/2011  . Personal history of fall 06/23/2011  . Disturbance of skin sensation 05/26/2011  . Contusion of toe 05/26/2011  . Esophagitis, unspecified 04/14/2011  . Diaphragmatic hernia without mention  of obstruction or gangrene 04/14/2011  . Loss of weight 04/14/2011  . Closed fracture of two ribs 04/14/2011  . Ulcer of esophagus 02/25/2011  . Chest pain, unspecified 02/10/2011  . Unspecified hereditary and idiopathic peripheral neuropathy 02/05/2011  . Scoliosis (and kyphoscoliosis), idiopathic   . Unspecified urinary incontinence 12/27/2010  . Closed fracture of rib(s), unspecified 12/27/2010  . 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 Hyperlipidemia     Past Surgical History  Procedure Laterality Date  . Appendectomy    . Tonsillectomy    . Anal fissuer    . Anal fissure repair    . Varicose vein surgery      Ligation of vein  . Tongue surgery    . Hand surgery    . Femur im nail  07/03/2011    Procedure: INTRAMEDULLARY (IM) NAIL FEMORAL;  Surgeon: Melina Schools, MD;  Location: [facility];  Service: Orthopedics;  Laterality: Left;  Synthes troch nail, jackson bed, c-arm  . Hip fracture surgery    . Breast surgery  1986    Left mastectomy  . Tongue surgery  2000    lump on back of tongue Dr. Owens Shark  . Cataract extraction w/ intraocular lens implant  2000    right  . Orif acetabular fracture  07/03/2011    left hip Dr. Rolena Infante  . Esophagogastroduodenoscopy endoscopy  [id number]    Dr. Cristina Gong with biopsy gastroesophageal junction mucosa with focal  ulceration no intestinal metaplasia, dysphasia, or malignancy. Does have a hiatal hernia with esophagitis  . Colonoscopy w/ biopsies  02/21/2011    polyp-hyperplastic polyp, no adenomatous change or malignancy Dr. Cristina Gong    There were no vitals filed for this visit.  Visit Diagnosis:  Stiffness of joint, hand, left  Stiffness of joint, hand, right  Muscle spasticity      Subjective Assessment - 05/29/15 1651    Subjective  Pt non verbal and unable to engage in any way   Patient is accompained by: Family member  son, caregivers   Pertinent History see epic   Patient Stated Goals son states they are hoping to  prevent further contacture to hands/wrists   Currently in Pain? --  unable to assess but does not appear in distress                      OT Treatments/Exercises (OP) - 05/29/15 0001    Splinting   Splinting Fabrication of modified resting hand splint for RUE to prevent further contracture and prevent skin breakdown/promote good hygiene.  Pt did not appear to be any distress during session.  Pt is at high risk for potential poor tolerance given that she is unable to communicate verbally or non verbally to indicate any discomfort. Discussed at length with son  and caregivers the need to SLOWLY ramp pt up and suggested that she wear splints during day as pt is unable to use her arms functionally and this will enable easier monitoring.  Will finish fabrication of splint and provide wear and care instructions next session.                     OT Long Term Goals - 05/29/15 1703    OT LONG TERM GOAL #1   Title Pt's family will demosntrate understanding of wear and care of bilateral splints to prevent further contracture to wrist and hands - 06/19/2015   Status On-going   OT LONG TERM GOAL #2   Title Pt's aides and son will demonstrate ability to don and doff splints    Status On-going   OT LONG TERM GOAL #3   Title Pt's son and caregivers witll verbalize understanding of how to monitor splint tolerance   Status On-going   OT LONG TERM GOAL #4   Title Pt will tolerate splints for at least 4 hours 2x/day.to prevent further contractures/skin breakdown   Status On-going   OT LONG TERM GOAL #5   Title Pt's son and caregivers will demosntrate understanding of bed positioning to assist in reducing tone in UE's as well as reduce risk of futher contracture.   Status On-going               Plan - 05/29/15 1703    Clinical Impression Statement Pt/family progressing toward goals. Son and caregiver able to verbalize understanding of splints and need to closely monitor.    Pt will benefit from skilled therapeutic intervention in order to improve on the following deficits (Retired) Decreased range of motion;Impaired UE functional use;Impaired tone;Impaired flexibility;Pain   Rehab Potential Fair   Clinical Impairments Affecting Rehab Potential severe cognitive deficits, primarily non verbal, unable to determine sensation, at risk for breakdown and futher contracture   OT Frequency 1x / week   OT Duration 4 weeks   OT Treatment/Interventions Therapeutic exercise;Manual Therapy;Passive range of motion;Splinting;Patient/family education;Moist Heat   Plan provide education on wear and care, practice donning and doffing, start  other splint   Consulted and Agree with Plan of Care Patient;Family member/caregiver   Family Member Consulted son        Problem List Patient Active Problem List   Diagnosis Date Noted  . Palliative care encounter 03/09/2015  . DNR (do not resuscitate) 03/09/2015  . Dysphagia   . Acute encephalopathy   . UTI (lower urinary tract infection) 03/07/2015  . 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 Parkinsonism ([place]) 03/07/2015  . Hyperlipidemia   . Apgar appearance score 1, pink body, bluish hands or feet 05/09/2014  . Cough 04/05/2014  . Dysphagia, pharyngoesophageal phase 10/25/2013  . Contracture of finger joint 07/25/2013  . Muscle spasticity 05/20/2013  . Mild cognitive impairment with memory loss 12/23/2012  . Encounter for long-term (current) use of other medications 12/22/2012  . Rhinitis, non-allergic   . Osteoarthritis of both knees   . Restless leg syndrome 08/04/2012  . Sleep disturbance, unspecified 06/23/2012  . Depressive disorder, not elsewhere classified 06/23/2012  . Essential hypertension   . Hip fracture ([place])   . Fracture of left hip ([place]) 07/03/2011  . Osteoporosis 07/03/2011  . Breast cancer ([place]) 07/03/2011  . Cancer ([place])   . Acid reflux   . Abnormality of gait 06/24/2007     Quay Burow, OTR/L 05/29/2015, 5:05 PM  [facility] [address] [place], [place], [address] Phone: [phone]   Fax:  [phone]  Name: TERRAN DIMITRIOU MRN: [id number] Date of Birth: 25-Feb-1933

## 2015-06-05 ENCOUNTER — Encounter: Payer: Self-pay | Admitting: Occupational Therapy

## 2015-06-05 ENCOUNTER — Ambulatory Visit: Admitting: Occupational Therapy

## 2015-06-05 DIAGNOSIS — M62838 Other muscle spasm: Secondary | ICD-10-CM

## 2015-06-05 DIAGNOSIS — M25642 Stiffness of left hand, not elsewhere classified: Secondary | ICD-10-CM | POA: Diagnosis not present

## 2015-06-05 DIAGNOSIS — M25641 Stiffness of right hand, not elsewhere classified: Secondary | ICD-10-CM

## 2015-06-05 NOTE — Therapy (Signed)
[facility] [address] [place], [place], [address] Phone: [phone]   Fax:  [phone]  Occupational Therapy Treatment  Patient Details  Name: Isabella George MRN: [id number] Date of Birth: 06-Feb-1933 Referring Provider: Wells Guiles Tat  Encounter Date: 06/05/2015      OT End of Session - 06/05/15 1725    Visit Number 3   Number of Visits 4   Date for OT Re-Evaluation 07/17/15   Authorization Type Medicare, will need G code and PN every 10th visit   Authorization Time Period 60 days   Authorization - Visit Number 3   Authorization - Number of Visits 4   OT Start Time 1446   OT Stop Time 1530   OT Time Calculation (min) 44 min      Past Medical History  Diagnosis Date  . Osteoporosis, senile   . Cancer [facility])     Breast Cancer  . Acid reflux   . Hip fracture ([place])   . Depressive disorder, not elsewhere classified 2012  . Anemia, unspecified   . Acute posthemorrhagic anemia 06/2011  . Malignant neoplasm of breast (female), unspecified site 29    s/p left mastectomy  . Sleep disturbance, unspecified   . Abnormality of gait   . Allergic rhinitis due to pollen   . Unspecified essential hypertension   . Restless leg syndrome 08/04/2012  . Osteoarthritis of both knees   . Rhinitis, non-allergic   . Memory loss   . Osteoarthrosis, unspecified whether generalized or localized, unspecified site 12/17/2011  . Debility, unspecified 07/25/2011  . Aneurysm of unspecified site ([place]) 07/08/2011  . Unspecified venous (peripheral) insufficiency 07/08/2011  . Closed fracture of intertrochanteric section of femur ([place]) 07/07/2011  . Dizziness and giddiness 06/23/2011  . Personal history of fall 06/23/2011  . Disturbance of skin sensation 05/26/2011  . Contusion of toe 05/26/2011  . Esophagitis, unspecified 04/14/2011  . Diaphragmatic hernia without mention of obstruction or gangrene 04/14/2011  . Loss of weight 04/14/2011  .  Closed fracture of two ribs 04/14/2011  . Ulcer of esophagus 02/25/2011  . Chest pain, unspecified 02/10/2011  . Unspecified hereditary and idiopathic peripheral neuropathy 02/05/2011  . Scoliosis (and kyphoscoliosis), idiopathic   . Unspecified urinary incontinence 12/27/2010  . Closed fracture of rib(s), unspecified 12/27/2010  . 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 Hyperlipidemia     Past Surgical History  Procedure Laterality Date  . Appendectomy    . Tonsillectomy    . Anal fissuer    . Anal fissure repair    . Varicose vein surgery      Ligation of vein  . Tongue surgery    . Hand surgery    . Femur im nail  07/03/2011    Procedure: INTRAMEDULLARY (IM) NAIL FEMORAL;  Surgeon: Melina Schools, MD;  Location: [facility];  Service: Orthopedics;  Laterality: Left;  Synthes troch nail, jackson bed, c-arm  . Hip fracture surgery    . Breast surgery  1986    Left mastectomy  . Tongue surgery  2000    lump on back of tongue Dr. Owens Shark  . Cataract extraction w/ intraocular lens implant  2000    right  . Orif acetabular fracture  07/03/2011    left hip Dr. Rolena Infante  . Esophagogastroduodenoscopy endoscopy  [id number]    Dr. Cristina Gong with biopsy gastroesophageal junction mucosa with focal ulceration no intestinal metaplasia, dysphasia, or malignancy. Does have a hiatal hernia  with esophagitis  . Colonoscopy w/ biopsies  02/21/2011    polyp-hyperplastic polyp, no adenomatous change or malignancy Dr. Cristina Gong    There were no vitals filed for this visit.  Visit Diagnosis:  Stiffness of joint, hand, left  Stiffness of joint, hand, right  Muscle spasticity      Subjective Assessment - 06/05/15 1450    Subjective  Pt non verbal and unable to engage in any way   Patient is accompained by: Family member  son   Pertinent History see epic   Patient Stated Goals son states they are hoping to prevent further contacture to hands/wrists                       OT Treatments/Exercises (OP) - 06/05/15 0001    Splinting   Splinting Rechecked final fitting for splint for R hand and provided education to son and 2 caregivers on donning and doffing,, wear and care and importance of skin checks and ramping pt up slowly to tolerance of splint.  Able to return demonstrate how to don splint and to verbalize understanding of wear and care.  See pt instructions for details.  Began pattern for custom fit splint for LUE which will be more difficult due to more severe contractures.                 OT Education - 06/05/15 1723    Education provided Yes   Education Details splint wear and care for RUE   Person(s) Educated Patient;Caregiver(s);Child(ren)   Methods Explanation;Demonstration;Verbal cues;Handout   Comprehension Verbalized understanding;Returned demonstration             OT Long Term Goals - 06/05/15 1724    OT LONG TERM GOAL #1   Title Pt's family will demosntrate understanding of wear and care of bilateral splints to prevent further contracture to wrist and hands - 06/19/2015   Status On-going   OT LONG TERM GOAL #2   Title Pt's aides and son will demonstrate ability to don and doff splints    Status On-going   OT LONG TERM GOAL #3   Title Pt's son and caregivers witll verbalize understanding of how to monitor splint tolerance   Status Achieved   OT LONG TERM GOAL #4   Title Pt will tolerate splints for at least 4 hours 2x/day.to prevent further contractures/skin breakdown   Status On-going   OT LONG TERM GOAL #5   Title Pt's son and caregivers will demosntrate understanding of bed positioning to assist in reducing tone in UE's as well as reduce risk of futher contracture.   Status On-going               Plan - 06/05/15 1724    Clinical Impression Statement Pt/family progressing toward goals.  Will address fabrication of second splint next visit.   Pt will benefit from skilled therapeutic intervention in order to  improve on the following deficits (Retired) Decreased range of motion;Impaired UE functional use;Impaired tone;Impaired flexibility;Pain   Rehab Potential Fair   Clinical Impairments Affecting Rehab Potential severe cognitive deficits, primarily non verbal, unable to determine sensation, at risk for breakdown and futher contracture   OT Frequency 1x / week   OT Duration 4 weeks   OT Treatment/Interventions Therapeutic exercise;Manual Therapy;Passive range of motion;Splinting;Patient/family education;Moist Heat   Plan fabricate custom splint for LUE, check tolerance for RUE   Consulted and Agree with Plan of Care Patient;Family member/caregiver   Family Member Consulted son  Problem List Patient Active Problem List   Diagnosis Date Noted  . Palliative care encounter 03/09/2015  . DNR (do not resuscitate) 03/09/2015  . Dysphagia   . Acute encephalopathy   . UTI (lower urinary tract infection) 03/07/2015  . PSP (progressive supranuclear palsy) ([place]) 03/07/2015  . Parkinsonism ([place]) 03/07/2015  . Hyperlipidemia   . Apgar appearance score 1, pink body, bluish hands or feet 05/09/2014  . Cough 04/05/2014  . Dysphagia, pharyngoesophageal phase 10/25/2013  . Contracture of finger joint 07/25/2013  . Muscle spasticity 05/20/2013  . Mild cognitive impairment with memory loss 12/23/2012  . Encounter for long-term (current) use of other medications 12/22/2012  . Rhinitis, non-allergic   . Osteoarthritis of both knees   . Restless leg syndrome 08/04/2012  . Sleep disturbance, unspecified 06/23/2012  . Depressive disorder, not elsewhere classified 06/23/2012  . Essential hypertension   . Hip fracture ([place])   . Fracture of left hip ([place]) 07/03/2011  . Osteoporosis 07/03/2011  . Breast cancer ([place]) 07/03/2011  . Cancer ([place])   . Acid reflux   . Abnormality of gait 06/24/2007    Quay Burow, OTR/L 06/05/2015, 5:27 PM  [facility] [address] [place], [place], [address] Phone: [phone]   Fax:  [phone]  Name: Isabella George MRN: [id number] Date of Birth: [birth year]

## 2015-06-05 NOTE — Patient Instructions (Addendum)
Your Splint This splint should initially be fitted by a healthcare practitioner. The healthcare practitioner is responsible for providing wearing instructions and precautions to the patient, other healthcare practitioners and care provider involved in the patient's care. This splint was custom made for you. Please read the following instructions to learn about wearing and caring for your splint.  Precautions Should your splint cause any of the following problems, remove the splint immediately and contact your therapist/physician.  Swelling  Severe Pain  Pressure Areas  Stiffness  Numbness  If she develops a red area and it does not go away within 15 minutes of removing the splint do not reapply the splint. Make a note of where the red area is and let your therapist know. We will readjust the splint.   When To Wear Your Splint Where your splint according to your therapist/physician instructions. Patient will wear splint for shorter periods of time when building up tolerance. Please build up tolerance over at least one week as follows: Day 1: Wear for 20 minutes on, 2 hours off during the day only. You will want to work around the time that you are providing bathing and dressing assistance as well as transfers as the splints will make these activities much more difficult. Day 2: Wear for 30 minutes on, 2 hours off during the day only. Day 3: Wear for 45 minutes on, 2 hours off during the day only. Day 4: Wear for an hour on, 2 hours off during the day only Day 5: Wear for 75 minutes on, 2 hours off during the day only. Day 6: Wear for 90 minutes on, 2 hours off during the day only Day 7; Wear 2 hours on, 2 hours off during the day only. This will be her permanent wearing schedule.  Daytime for 2 hours and Remove splint every 2 hours - you will need to always monitor her skin, even once her tolerance is built up.   Care and Cleaning of Your Splint 1. Keep your splint away from  open flames. 2. Your splint will lose its shape in temperatures over 135 degrees Farenheit, ( in car windows, near radiators, ovens or in hot water). Never make any adjustments to your splint, if the splint needs adjusting remove it and make an appointment to see your therapist. 3. Your splint, including the cushion liner may be cleaned with soap and lukewarm water. Do not immerse in hot water over 135 degrees Farenheit. 4. Straps may be washed with soap and water, but do not moisten the self-adhesive portion. Do not immerse the splint in water.  5. For ink or hard to remove spots you can use rubbing alcohol however make sure you rinse and dry the splint well before reapplying to avoid irritating her skin.

## 2015-06-06 ENCOUNTER — Telehealth: Payer: Self-pay

## 2015-06-06 NOTE — Telephone Encounter (Signed)
1.) FYI patient is seeing a neurologist, Neurologist has ordered a custome made sling. Hospice was unaware that patient was seeing a neurologist, just found out yesterday.  2.) Patient impacted, hospice recommended miralax. Patient's son gave miralax x 1 and said his mother does not like it. Hospice is now doing an enema once weekly. Hospice nurse would like a treatment plan moving forward to address constipation.  Last OV 03-20-15, no pending appointment.   Please advise

## 2015-06-06 NOTE — Telephone Encounter (Signed)
I agree with the miralax daily.  Perhaps it can be mixed in something tasty that she likes.

## 2015-06-06 NOTE — Telephone Encounter (Signed)
I spoke with Aaron Edelman and he will try miralax daily. I called patient's hospice nurse and informed her of Dr.Reed's recommendation as well.

## 2015-06-12 ENCOUNTER — Ambulatory Visit: Attending: Neurology | Admitting: Occupational Therapy

## 2015-06-12 ENCOUNTER — Encounter: Payer: Self-pay | Admitting: Occupational Therapy

## 2015-06-12 DIAGNOSIS — M25642 Stiffness of left hand, not elsewhere classified: Secondary | ICD-10-CM | POA: Insufficient documentation

## 2015-06-12 DIAGNOSIS — M25641 Stiffness of right hand, not elsewhere classified: Secondary | ICD-10-CM | POA: Insufficient documentation

## 2015-06-12 DIAGNOSIS — M6249 Contracture of muscle, multiple sites: Secondary | ICD-10-CM | POA: Diagnosis not present

## 2015-06-12 NOTE — Therapy (Signed)
[facility] [address] [place], [place], [address] Phone: [phone]   Fax:  [phone]  Occupational Therapy Treatment  Patient Details  Name: Isabella George MRN: [id number] Date of Birth: [birth year] Referring Provider: Wells Guiles Tat  Encounter Date: 06/12/2015      OT End of Session - 06/12/15 1645    Visit Number 4   Number of Visits 4   Date for OT Re-Evaluation 07/17/15   Authorization Type Medicare, will need G code and PN every 10th visit   Authorization Time Period 60 days   Authorization - Visit Number 4   Authorization - Number of Visits 4   OT Start Time 1535   OT Stop Time 1616   OT Time Calculation (min) 41 min   Activity Tolerance Patient tolerated treatment well      Past Medical History  Diagnosis Date  . Osteoporosis, senile   . Cancer [facility])     Breast Cancer  . Acid reflux   . Hip fracture ([place])   . Depressive disorder, not elsewhere classified 2012  . Anemia, unspecified   . Acute posthemorrhagic anemia 06/2011  . Malignant neoplasm of breast (female), unspecified site 24    s/p left mastectomy  . Sleep disturbance, unspecified   . Abnormality of gait   . Allergic rhinitis due to pollen   . Unspecified essential hypertension   . Restless leg syndrome 08/04/2012  . Osteoarthritis of both knees   . Rhinitis, non-allergic   . Memory loss   . Osteoarthrosis, unspecified whether generalized or localized, unspecified site 12/17/2011  . Debility, unspecified 07/25/2011  . Aneurysm of unspecified site ([place]) 07/08/2011  . Unspecified venous (peripheral) insufficiency 07/08/2011  . Closed fracture of intertrochanteric section of femur ([place]) 07/07/2011  . Dizziness and giddiness 06/23/2011  . Personal history of fall 06/23/2011  . Disturbance of skin sensation 05/26/2011  . Contusion of toe 05/26/2011  . Esophagitis, unspecified 04/14/2011  . Diaphragmatic hernia without mention of obstruction or  gangrene 04/14/2011  . Loss of weight 04/14/2011  . Closed fracture of two ribs 04/14/2011  . Ulcer of esophagus 02/25/2011  . Chest pain, unspecified 02/10/2011  . Unspecified hereditary and idiopathic peripheral neuropathy 02/05/2011  . Scoliosis (and kyphoscoliosis), idiopathic   . Unspecified urinary incontinence 12/27/2010  . Closed fracture of rib(s), unspecified 12/27/2010  . 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 Hyperlipidemia     Past Surgical History  Procedure Laterality Date  . Appendectomy    . Tonsillectomy    . Anal fissuer    . Anal fissure repair    . Varicose vein surgery      Ligation of vein  . Tongue surgery    . Hand surgery    . Femur im nail  07/03/2011    Procedure: INTRAMEDULLARY (IM) NAIL FEMORAL;  Surgeon: Melina Schools, MD;  Location: [facility];  Service: Orthopedics;  Laterality: Left;  Synthes troch nail, jackson bed, c-arm  . Hip fracture surgery    . Breast surgery  1986    Left mastectomy  . Tongue surgery  2000    lump on back of tongue Dr. Owens Shark  . Cataract extraction w/ intraocular lens implant  2000    right  . Orif acetabular fracture  07/03/2011    left hip Dr. Rolena Infante  . Esophagogastroduodenoscopy endoscopy  [id number]    Dr. Cristina Gong with biopsy gastroesophageal junction mucosa with focal ulceration no intestinal metaplasia,  dysphasia, or malignancy. Does have a hiatal hernia with esophagitis  . Colonoscopy w/ biopsies  02/21/2011    polyp-hyperplastic polyp, no adenomatous change or malignancy Dr. Cristina Gong    There were no vitals filed for this visit.  Visit Diagnosis:  Stiffness of joint, hand, left  Stiffness of joint, hand, right  Muscle spasticity      Subjective Assessment - 06/12/15 1636    Subjective  Pt non verbal and unable to engage in any way   Patient is accompained by: --  caregivers   Pertinent History see epic   Patient Stated Goals son states they are hoping to prevent further contacture to  hands/wrists   Currently in Pain? No/denies  unable to truly assess but does not appear to be in distress                      OT Treatments/Exercises (OP) - 06/12/15 0001    Splinting   Splinting fabricated dorsal splint for LUE to prevent pt from posturing into wirst flexion and deviation with soft roll in hand to prevent further contracture or skin break down. Supplied caregivers with extra straps and extra soft roll as well as instructed in wear and care and wearing schedule (same as other splint).  Recommneded that splints be alternated vs worn at the same time at least initially to avoid causing distress.  Caregivers able to don and doff and verbalized understanding of how to build up wearing schedule and what to monitor.  Caregiver reports no issues with splint for RUE and that pt is tolreating well.                      OT Long Term Goals - 06/12/15 1642    OT LONG TERM GOAL #1   Title Pt's family will demosntrate understanding of wear and care of bilateral splints to prevent further contracture to wrist and hands - 06/19/2015   Status Achieved   OT LONG TERM GOAL #2   Title Pt's aides and son will demonstrate ability to don and doff splints    Status Achieved   OT LONG TERM GOAL #3   Title Pt's son and caregivers witll verbalize understanding of how to monitor splint tolerance   Status Achieved   OT LONG TERM GOAL #4   Title Pt will tolerate splints for 2 hours on 2 hours off during the day only .to prevent further contractures/skin breakdown   Status Achieved   OT LONG TERM GOAL #5   Title Pt's son and caregivers will demosntrate understanding of bed positioning to assist in reducing tone in UE's as well as reduce risk of futher contracture.   Status Achieved               Plan - 06/12/15 1643    Clinical Impression Statement Pt has met all LTG's and caregivers are able to return demonstrate donning and doffing of both splints as well as  verbalize understanding of wearing schedule and how to monitor.     Pt will benefit from skilled therapeutic intervention in order to improve on the following deficits (Retired) Decreased range of motion;Impaired UE functional use;Impaired tone;Impaired flexibility;Pain   Rehab Potential Fair   Clinical Impairments Affecting Rehab Potential severe cognitive deficits, primarily non verbal, unable to determine sensation, at risk for breakdown and futher contracture   OT Frequency 1x / week   OT Duration 4 weeks   OT Treatment/Interventions Therapeutic exercise;Manual Therapy;Passive  range of motion;Splinting;Patient/family education;Moist Heat   Plan place pt on hold for one week to ensure that there are no issues with splints - caregivers to call if issue arises. Will then discharge.    Consulted and Agree with Plan of Care Patient;Family member/caregiver        Problem List Patient Active Problem List   Diagnosis Date Noted  . Palliative care encounter 03/09/2015  . DNR (do not resuscitate) 03/09/2015  . Dysphagia   . Acute encephalopathy   . UTI (lower urinary tract infection) 03/07/2015  . PSP (progressive supranuclear palsy) ([place]) 03/07/2015  . Parkinsonism ([place]) 03/07/2015  . Hyperlipidemia   . Apgar appearance score 1, pink body, bluish hands or feet 05/09/2014  . Cough 04/05/2014  . Dysphagia, pharyngoesophageal phase 10/25/2013  . Contracture of finger joint 07/25/2013  . Muscle spasticity 05/20/2013  . Mild cognitive impairment with memory loss 12/23/2012  . Encounter for long-term (current) use of other medications 12/22/2012  . Rhinitis, non-allergic   . Osteoarthritis of both knees   . Restless leg syndrome 08/04/2012  . Sleep disturbance, unspecified 06/23/2012  . Depressive disorder, not elsewhere classified 06/23/2012  . Essential hypertension   . Hip fracture ([place])   . Fracture of left hip ([place]) 07/03/2011  . Osteoporosis 07/03/2011  . Breast cancer ([place])  07/03/2011  . Cancer ([place])   . Acid reflux   . Abnormality of gait 06/24/2007    Quay Burow, OTR/L 06/12/2015, 4:46 PM  [facility] [address] [place], [place], [address] Phone: [phone]   Fax:  [phone]  Name: DAVEAH VARONE MRN: [id number] Date of Birth: [birth year]

## 2015-06-14 ENCOUNTER — Telehealth: Payer: Self-pay

## 2015-06-14 NOTE — Telephone Encounter (Signed)
Patients son called the office with a concern of patients right cheek and red right eye. He said the  O.T.  nurse saw that patients right cheek was swollen on Tuesday and mentioned it to him but since then it is also the right eye has become red and she still has right  swollen cheek. O.T. nurse concerned it might be infection in the eye. Please Advise

## 2015-06-15 NOTE — Telephone Encounter (Addendum)
Hospice nurse calling stating that she went out to evaluate pt after pt had went to eye dr and per the eye dr the swelling has nothing to do with the eye. Per hospice the swelling is on the right side and pt is denying pain. Please advise

## 2015-06-15 NOTE — Telephone Encounter (Signed)
Second call came where hospice was gong to evaluate patient.

## 2015-06-16 NOTE — Telephone Encounter (Signed)
Would recommend cold compresses over the area 20 mins 4x per day.  If swelling is not improving, would consider antibiotic.  This is hard to evaluate without seeing her myself.

## 2015-06-18 ENCOUNTER — Encounter: Payer: Self-pay | Admitting: Occupational Therapy

## 2015-06-18 ENCOUNTER — Ambulatory Visit: Admitting: Occupational Therapy

## 2015-06-18 DIAGNOSIS — M25641 Stiffness of right hand, not elsewhere classified: Secondary | ICD-10-CM

## 2015-06-18 DIAGNOSIS — M25642 Stiffness of left hand, not elsewhere classified: Secondary | ICD-10-CM | POA: Diagnosis not present

## 2015-06-18 NOTE — Addendum Note (Signed)
Addended by: Rivka Irine on: 06/18/2015 09:07 AM   Modules accepted: Orders

## 2015-06-18 NOTE — Therapy (Signed)
[facility] [address] [place], [place], [address] Phone: [phone]   Fax:  [phone]  Occupational Therapy Treatment  Patient Details  Name: Isabella George MRN: [id number] Date of Birth: 1932/09/20 Referring Provider: Wells Guiles Tat  Encounter Date: 06/18/2015    Past Medical History  Diagnosis Date  . Osteoporosis, senile   . Cancer [facility])     Breast Cancer  . Acid reflux   . Hip fracture ([place])   . Depressive disorder, not elsewhere classified 2012  . Anemia, unspecified   . Acute posthemorrhagic anemia 06/2011  . Malignant neoplasm of breast (female), unspecified site 11    s/p left mastectomy  . Sleep disturbance, unspecified   . Abnormality of gait   . Allergic rhinitis due to pollen   . Unspecified essential hypertension   . Restless leg syndrome 08/04/2012  . Osteoarthritis of both knees   . Rhinitis, non-allergic   . Memory loss   . Osteoarthrosis, unspecified whether generalized or localized, unspecified site 12/17/2011  . Debility, unspecified 07/25/2011  . Aneurysm of unspecified site ([place]) 07/08/2011  . Unspecified venous (peripheral) insufficiency 07/08/2011  . Closed fracture of intertrochanteric section of femur ([place]) 07/07/2011  . Dizziness and giddiness 06/23/2011  . Personal history of fall 06/23/2011  . Disturbance of skin sensation 05/26/2011  . Contusion of toe 05/26/2011  . Esophagitis, unspecified 04/14/2011  . Diaphragmatic hernia without mention of obstruction or gangrene 04/14/2011  . Loss of weight 04/14/2011  . Closed fracture of two ribs 04/14/2011  . Ulcer of esophagus 02/25/2011  . Chest pain, unspecified 02/10/2011  . Unspecified hereditary and idiopathic peripheral neuropathy 02/05/2011  . Scoliosis (and kyphoscoliosis), idiopathic   . Unspecified urinary incontinence 12/27/2010  . Closed fracture of rib(s), unspecified 12/27/2010  . Contracture of finger joint 07/25/2013    Left  2,3,4 fingers  . Hyperlipidemia     Past Surgical History  Procedure Laterality Date  . Appendectomy    . Tonsillectomy    . Anal fissuer    . Anal fissure repair    . Varicose vein surgery      Ligation of vein  . Tongue surgery    . Hand surgery    . Femur im nail  07/03/2011    Procedure: INTRAMEDULLARY (IM) NAIL FEMORAL;  Surgeon: Melina Schools, MD;  Location: [facility] ORS;  Service: Orthopedics;  Laterality: Left;  Synthes troch nail, jackson bed, c-arm  . Hip fracture surgery    . Breast surgery  1986    Left mastectomy  . Tongue surgery  2000    lump on back of tongue Dr. Owens Shark  . Cataract extraction w/ intraocular lens implant  2000    right  . Orif acetabular fracture  07/03/2011    left hip Dr. Rolena Infante  . Esophagogastroduodenoscopy endoscopy  [id number]    Dr. Cristina Gong with biopsy gastroesophageal junction mucosa with focal ulceration no intestinal metaplasia, dysphasia, or malignancy. Does have a hiatal hernia with esophagitis  . Colonoscopy w/ biopsies  02/21/2011    polyp-hyperplastic polyp, no adenomatous change or malignancy Dr. Cristina Gong    There were no vitals filed for this visit.                                 OT Long Term Goals - 06/18/15 1349    OT LONG TERM GOAL #1   Title Pt's family will demosntrate understanding of wear  and care of bilateral splints to prevent further contracture to wrist and hands - 06/19/2015   Status Achieved   OT LONG TERM GOAL #2   Title Pt's aides and son will demonstrate ability to don and doff splints    Status Achieved   OT LONG TERM GOAL #3   Title Pt's son and caregivers witll verbalize understanding of how to monitor splint tolerance   Status Achieved   OT LONG TERM GOAL #4   Title Pt will tolerate splints for 2 hours on 2 hours off during the day only .to prevent further contractures/skin breakdown   Status Achieved   OT LONG TERM GOAL #5   Title Pt's son and caregivers will demosntrate understanding  of bed positioning to assist in reducing tone in UE's as well as reduce risk of futher contracture.   Status Achieved             Patient will benefit from skilled therapeutic intervention in order to improve the following deficits and impairments:     Visit Diagnosis: Stiffness of joint, hand, left  Stiffness of joint, hand, right      G-Codes - 2015/07/11 1350    Functional Assessment Tool Used skilled clinical observation   Functional Limitation Self care  caregiver for wear and care of splints   Self Care Current Status (Z6109) 0 percent impaired, limited or restricted   Self Care Goal Status (U0454) 0 percent impaired, limited or restricted   Self Care Discharge Status [phone]) 0 percent impaired, limited or restricted      Problem List Patient Active Problem List   Diagnosis Date Noted  . Palliative care encounter 03/09/2015  . DNR (do not resuscitate) 03/09/2015  . Dysphagia   . Acute encephalopathy   . UTI (lower urinary tract infection) 03/07/2015  . PSP (progressive supranuclear palsy) ([place]) 03/07/2015  . Parkinsonism ([place]) 03/07/2015  . Hyperlipidemia   . Apgar appearance score 1, pink body, bluish hands or feet 05/09/2014  . Cough 04/05/2014  . Dysphagia, pharyngoesophageal phase 10/25/2013  . Contracture of finger joint 07/25/2013  . Muscle spasticity 05/20/2013  . Mild cognitive impairment with memory loss 12/23/2012  . Encounter for long-term (current) use of other medications 12/22/2012  . Rhinitis, non-allergic   . Osteoarthritis of both knees   . Restless leg syndrome 08/04/2012  . Sleep disturbance, unspecified 06/23/2012  . Depressive disorder, not elsewhere classified 06/23/2012  . Essential hypertension   . Hip fracture ([place])   . Fracture of left hip ([place]) 07/03/2011  . Osteoporosis 07/03/2011  . Breast cancer ([place]) 07/03/2011  . Cancer ([place])   . Acid reflux   . Abnormality of gait 06/24/2007   OCCUPATIONAL THERAPY DISCHARGE  SUMMARY  Visits from Start of Care: 4  Current functional level related to goals / functional outcomes: See above   Remaining deficits: See initial eval   Education / Equipment: Splints, wear and care instructions, monitoring instructions Plan: Patient agrees to discharge.  Patient goals were partially met. Patient is being discharged due to meeting the stated rehab goals.  ?????     Quay Burow, OTR/L 07-11-2015, 1:51 PM  [facility] [address] [place], [place], [address] Phone: [phone]   Fax:  [phone]  Name: RANEISHA BRESS MRN: [id number] Date of Birth: 1932-09-08

## 2015-06-18 NOTE — Telephone Encounter (Signed)
Spoke with son and advised results and he states her cheek is remaining the same, and they will call if anything changes.

## 2015-06-19 ENCOUNTER — Telehealth: Payer: Self-pay | Admitting: *Deleted

## 2015-06-19 NOTE — Telephone Encounter (Signed)
Sorbitol is fine.

## 2015-06-19 NOTE — Telephone Encounter (Signed)
Lindsey from Hospice called regarding patient having issues with constipation,she stated that the Miralax does not seem to be working for her anymore. She states that this has been ongoing for approximately 1 month,was given a enema on Fri with no results. She recommends sorbitol if you are in agreement, she also stated that she checked for impaction and their was nothing there. Please Advise!

## 2015-06-19 NOTE — Telephone Encounter (Signed)
Spoke with the Hospice nurse regarding the sorbitol, she stated that they will try this weekend and inform Dr. Mariea Clonts of the results.

## 2015-06-26 ENCOUNTER — Ambulatory Visit: Admitting: Occupational Therapy

## 2015-07-03 ENCOUNTER — Encounter: Payer: BLUE CROSS/BLUE SHIELD | Admitting: Occupational Therapy

## 2015-07-09 ENCOUNTER — Other Ambulatory Visit: Payer: Self-pay | Admitting: *Deleted

## 2015-08-27 ENCOUNTER — Telehealth: Payer: Self-pay | Admitting: *Deleted

## 2015-08-27 NOTE — Telephone Encounter (Signed)
Son, Isabella George called and stated that he needs a letter stating that his mother in incompetent and incapable of conducting her own affairs.  The son stated that patient does not actually live at [facility] but you have been seeing her there. He also stated that he see's no reason why she would need an appointment just to get a letter from you. Please Advise.

## 2015-08-30 NOTE — Telephone Encounter (Signed)
Son, Aaron Edelman called today requesting letter.

## 2015-08-31 ENCOUNTER — Encounter: Payer: Self-pay | Admitting: Internal Medicine

## 2015-08-31 NOTE — Telephone Encounter (Signed)
Done.  Would Aaron Edelman like to pick it up here or at [facility] next Tues or Wed?

## 2015-08-31 NOTE — Telephone Encounter (Signed)
Aaron Edelman notified and Deida will pick up from office.

## 2015-10-18 ENCOUNTER — Telehealth: Payer: Self-pay | Admitting: *Deleted

## 2015-10-18 NOTE — Telephone Encounter (Signed)
Received U/A Results from [facility] [phone] Fax#: [phone] Given to Dr. Mariea Clonts. Awaiting Final Culture.

## 2015-10-22 ENCOUNTER — Telehealth: Payer: Self-pay | Admitting: Neurology

## 2015-10-22 NOTE — Telephone Encounter (Signed)
Spoke with patient's son - states she is undergoing OT therapy and they mentioned possiblity of muscle relaxer like Baclofen to help patient. They are having a hard time even opening her legs to clean her because she is so stiff (they state they had to "pry" them open). She is getting to the point where she is having a hard time sitting in wheelchair due to constant muscle cramping in limbs. Her eating/drinking is worse and she can only take liquid or crushed medications.  He is aware if this is something you would prescribe they would probably need an appt first and request the soonest afternoon appt.  Only current medications are multivitamin, aspirin, and tylenol PRN.  Please advise.

## 2015-10-22 NOTE — Telephone Encounter (Signed)
Spoke with patient's son and advised results. UA and culture is negative

## 2015-10-22 NOTE — Telephone Encounter (Signed)
Looking at her records and appears being taken care of by hospice now and they should be able to address these things.  I know it is very difficult to get her out and about now.

## 2015-10-22 NOTE — Telephone Encounter (Signed)
Patient son Aaron Edelman called and wants to talk to someone about the drug Baclofen. He would like to know if would be helpful to his mother please call [phone]

## 2015-10-22 NOTE — Telephone Encounter (Signed)
Patient's son made aware. He wanted to make sure muscle relaxer wouldn't be a problem. They will discuss with hospice doctor.

## 2015-10-24 ENCOUNTER — Telehealth: Payer: Self-pay

## 2015-10-24 ENCOUNTER — Telehealth: Payer: Self-pay | Admitting: Neurology

## 2015-10-24 DIAGNOSIS — M2450 Contracture, unspecified joint: Secondary | ICD-10-CM

## 2015-10-24 MED ORDER — BACLOFEN 10 MG PO TABS: 10.0000 mg | ORAL_TABLET | Freq: Three times a day (TID) | ORAL | 5 refills | Status: DC

## 2015-10-24 NOTE — Telephone Encounter (Signed)
[name] from hospice called requesting order for Baclofen on the patient's son behalf. (see phone note from neurology dated 10/24/15). Patient with multiple joint pain. RX to be sent to [facility].[address] [name] would like a call back with outcome.  Please advise

## 2015-10-24 NOTE — Telephone Encounter (Signed)
Received voicemail from [place], hospice nurse [phone]), wanting order from Dr. Carles Collet for Baclofen.   Left her a message making her aware that she had no problem with Baclofen being used on patient but should be prescribed by hospice MD.

## 2015-10-25 NOTE — Telephone Encounter (Signed)
Ashley notified.

## 2015-11-06 ENCOUNTER — Telehealth: Payer: Self-pay | Admitting: *Deleted

## 2015-11-06 NOTE — Telephone Encounter (Signed)
[name] with Hospice called and stated that patient has increased mucus production since Friday, Afebrile, and Lungs clear. Family wants to know if an Antihistamine or Decongestion needs to be ordered. They are thinking she has a cold. Please Advise.

## 2015-11-07 NOTE — Telephone Encounter (Signed)
[name] notified and agreed.

## 2015-11-07 NOTE — Telephone Encounter (Signed)
mucinex DM can be used 76ml po q 6 hrs prn congestion for up to 14 days

## 2016-01-17 ENCOUNTER — Telehealth: Payer: Self-pay | Admitting: Neurology

## 2016-01-17 ENCOUNTER — Emergency Department (HOSPITAL_COMMUNITY)

## 2016-01-17 ENCOUNTER — Telehealth: Payer: Self-pay | Admitting: *Deleted

## 2016-01-17 ENCOUNTER — Encounter (HOSPITAL_COMMUNITY): Payer: Self-pay

## 2016-01-17 ENCOUNTER — Emergency Department (HOSPITAL_COMMUNITY)
Admission: EM | Admit: 2016-01-17 | Discharge: 2016-01-17 | Disposition: A | Discharge status: Home / Self Care | Attending: Emergency Medicine | Admitting: Emergency Medicine

## 2016-01-17 ENCOUNTER — Ambulatory Visit: Payer: Medicare Other | Admitting: Internal Medicine

## 2016-01-17 DIAGNOSIS — X58XXXA Exposure to other specified factors, initial encounter: Secondary | ICD-10-CM | POA: Diagnosis not present

## 2016-01-17 DIAGNOSIS — S42492A Other displaced fracture of lower end of left humerus, initial encounter for closed fracture: Secondary | ICD-10-CM | POA: Diagnosis not present

## 2016-01-17 DIAGNOSIS — Y939 Activity, unspecified: Secondary | ICD-10-CM | POA: Insufficient documentation

## 2016-01-17 DIAGNOSIS — Y999 Unspecified external cause status: Secondary | ICD-10-CM | POA: Diagnosis not present

## 2016-01-17 DIAGNOSIS — Z853 Personal history of malignant neoplasm of breast: Secondary | ICD-10-CM | POA: Insufficient documentation

## 2016-01-17 DIAGNOSIS — Y929 Unspecified place or not applicable: Secondary | ICD-10-CM | POA: Insufficient documentation

## 2016-01-17 DIAGNOSIS — Z7982 Long term (current) use of aspirin: Secondary | ICD-10-CM | POA: Insufficient documentation

## 2016-01-17 DIAGNOSIS — I10 Essential (primary) hypertension: Secondary | ICD-10-CM | POA: Diagnosis not present

## 2016-01-17 DIAGNOSIS — S4992XA Unspecified injury of left shoulder and upper arm, initial encounter: Secondary | ICD-10-CM | POA: Diagnosis present

## 2016-01-17 DIAGNOSIS — R52 Pain, unspecified: Secondary | ICD-10-CM

## 2016-01-17 MED ORDER — ACETAMINOPHEN 160 MG/5ML PO SOLN
650.0000 mg | Freq: Once | ORAL | Status: AC
  Administered 2016-01-17: 650 mg via ORAL
  Filled 2016-01-17: qty 20.3

## 2016-01-17 MED ORDER — HYDROCODONE-ACETAMINOPHEN 7.5-325 MG/15ML PO SOLN: 5.0000 mL | Freq: Once | ORAL | Status: DC

## 2016-01-17 MED ORDER — HYDROCODONE-ACETAMINOPHEN 7.5-325 MG/15ML PO SOLN: 5.0000 mL | Freq: Four times a day (QID) | ORAL | 0 refills | Status: AC | PRN

## 2016-01-17 NOTE — ED Provider Notes (Signed)
[facility] DEPT Provider Note   CSN: [id number] Arrival date & time: 01/17/16  1310     History   Chief Complaint Chief Complaint  Patient presents with  . Arm Pain    HPI Isabella George is a 82 y.o. female.  HPI Pt has a history of palsy and muscle spasticity.  This am while providing care for her, the caregivers were moving her left arm.  While doing that, they heard an audible pop and the arm now feels very loose.  Pt also now feels fine according to family.  It is typically hard for her to speak but she did mention her elbow hurting.  Pt at baseline is not able to walk.  She has to be transferred from the bed to a chair.  She requires maximal care.  Past Medical History:  Diagnosis Date  . Abnormality of gait   . Acid reflux   . Acute posthemorrhagic anemia 06/2011  . Allergic rhinitis due to pollen   . Anemia, unspecified   . Aneurysm of unspecified site 07/08/2011  . Cancer [facility])    Breast Cancer  . Chest pain, unspecified 02/10/2011  . Closed fracture of intertrochanteric section of femur ([place]) 07/07/2011  . Closed fracture of rib(s), unspecified 12/27/2010  . Closed fracture of two ribs 04/14/2011  . Contracture of finger joint 07/25/2013   Left 2,3,4 fingers  . Contusion of toe 05/26/2011  . Debility, unspecified 07/25/2011  . Depressive disorder, not elsewhere classified 2012  . Diaphragmatic hernia without mention of obstruction or gangrene 04/14/2011  . Disturbance of skin sensation 05/26/2011  . Dizziness and giddiness 06/23/2011  . Esophagitis, unspecified 04/14/2011  . Hip fracture ([place])   . Hyperlipidemia   . Loss of weight 04/14/2011  . Malignant neoplasm of breast (female), unspecified site 84   s/p left mastectomy  . Memory loss   . Osteoarthritis of both knees   . Osteoarthrosis, unspecified whether generalized or localized, unspecified site 12/17/2011  . Osteoporosis, senile   . Personal history of fall 06/23/2011  . Restless leg syndrome 08/04/2012  .  Rhinitis, non-allergic   . Scoliosis (and kyphoscoliosis), idiopathic   . Sleep disturbance, unspecified   . Ulcer of esophagus 02/25/2011  . Unspecified essential hypertension   . Unspecified hereditary and idiopathic peripheral neuropathy 02/05/2011  . Unspecified urinary incontinence 12/27/2010  . Unspecified venous (peripheral) insufficiency 07/08/2011    Patient Active Problem List   Diagnosis Date Noted  . Palliative care encounter 03/09/2015  . DNR (do not resuscitate) 03/09/2015  . Dysphagia   . Acute encephalopathy   . UTI (lower urinary tract infection) 03/07/2015  . 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 Parkinsonism ([place]) 03/07/2015  . Hyperlipidemia   . Apgar appearance score 1, pink body, bluish hands or feet 05/09/2014  . Cough 04/05/2014  . Dysphagia, pharyngoesophageal phase 10/25/2013  . Contracture of finger joint 07/25/2013  . Muscle spasticity 05/20/2013  . Mild cognitive impairment with memory loss 12/23/2012  . Encounter for long-term (current) use of other medications 12/22/2012  . Rhinitis, non-allergic   . Osteoarthritis of both knees   . Restless leg syndrome 08/04/2012  . Sleep disturbance, unspecified 06/23/2012  . Depressive disorder, not elsewhere classified 06/23/2012  . Essential hypertension   . Hip fracture ([place])   . Fracture of left hip ([place]) 07/03/2011  . Osteoporosis 07/03/2011  . Breast cancer ([place]) 07/03/2011  . Cancer ([place])   . Acid reflux   . Abnormality of gait  06/24/2007    Past Surgical History:  Procedure Laterality Date  . Anal fissuer    . ANAL FISSURE REPAIR    . APPENDECTOMY    . BREAST SURGERY  1986   Left mastectomy  . CATARACT EXTRACTION W/ INTRAOCULAR LENS IMPLANT  2000   right  . COLONOSCOPY W/ BIOPSIES  02/21/2011   polyp-hyperplastic polyp, no adenomatous change or malignancy Dr. Cristina Gong  . ESOPHAGOGASTRODUODENOSCOPY ENDOSCOPY  [id number]   Dr. Cristina Gong with biopsy gastroesophageal junction  mucosa with focal ulceration no intestinal metaplasia, dysphasia, or malignancy. Does have a hiatal hernia with esophagitis  . FEMUR IM NAIL  07/03/2011   Procedure: INTRAMEDULLARY (IM) NAIL FEMORAL;  Surgeon: Melina Schools, MD;  Location: [facility] ORS;  Service: Orthopedics;  Laterality: Left;  Synthes troch nail, jackson bed, c-arm  . HAND SURGERY    . HIP FRACTURE SURGERY    . ORIF ACETABULAR FRACTURE  07/03/2011   left hip Dr. Rolena Infante  . TONGUE SURGERY    . TONGUE SURGERY  2000   lump on back of tongue Dr. Owens Shark  . TONSILLECTOMY    . VARICOSE VEIN SURGERY     Ligation of vein    OB History    Gravida Para Term Preterm AB Living   3 2 2   1 2    SAB TAB Ectopic Multiple Live Births                   Home Medications    Prior to Admission medications   Medication Sig Start Date End Date Taking? Authorizing Provider  acetaminophen (TYLENOL) 160 MG/5ML liquid Take 500 mg by mouth every 4 (four) hours as needed for pain.   Yes Historical Provider, MD  Ascorbic Acid (VITAMIN C) POWD Take 1,200 mg by mouth daily.   Yes Historical Provider, MD  aspirin 81 MG chewable tablet Chew 81 mg by mouth daily.   Yes Historical Provider, MD  B Complex-Folic Acid (B COMPLEX-VITAMIN B12 PO) Take 5 mLs by mouth daily.   Yes Historical Provider, MD  bifidobacterium infantis (ALIGN) capsule Take 1 capsule by mouth daily.   Yes Historical Provider, MD  Cholecalciferol (VITAMIN D3) LIQD Take 1 drop by mouth daily. Reported on 03/16/2015   Yes Historical Provider, MD  denosumab (PROLIA) 60 MG/ML SOLN injection Inject 60 mg into the skin every 6 (six) months. Reported on 03/16/2015   Yes Historical Provider, MD  EPINEPHrine 0.3 mg/0.3 mL IJ SOAJ injection Inject 0.3 mLs (0.3 mg total) into the muscle once. 10/11/14  Yes Tiffany L Reed, DO  influenza vac recom quadrivalent (FLUBLOK) 0.5 ML injection Inject 0.5 mLs into the muscle once.   Yes Historical Provider, MD  sorbitol 70 % solution Take 15 mLs by mouth daily as  needed (for constipation).   Yes Historical Provider, MD  baclofen (LIORESAL) 10 MG tablet Take 1 tablet (10 mg total) by mouth 3 (three) times daily. Patient not taking: Reported on 01/17/2016 10/24/15   Tiffany L Reed, DO  donepezil (ARICEPT) 5 MG tablet Take 1 tablet (5 mg total) by mouth at bedtime. Patient not taking: Reported on 01/17/2016 03/10/15   Modena Jansky, MD  HYDROcodone-acetaminophen (HYCET) 7.5-325 mg/15 ml solution Take 5 mLs by mouth 4 (four) times daily as needed for moderate pain. 01/17/16 01/16/17  Dorie Rank, MD  ipratropium-albuterol (DUONEB) 0.5-2.5 (3) MG/3ML SOLN Take 3 mLs by nebulization every 6 (six) hours as needed (congestion, wheezing, coughing). Dx acute bronchitis with bronchospasm Patient not taking:  Reported on 01/17/2016 01/31/15   Gayland Curry, DO    Family History Family History  Problem Relation Age of Onset  . Heart disease Mother   . Heart disease Brother   . Heart attack Brother   . Cancer Father     Liver cancer  . COPD Brother   . Deep vein thrombosis Brother   . Varicose Veins Brother   . Hyperlipidemia Son   . Diabetes Paternal Aunt     Social History Social History  Substance Use Topics  . Smoking status: Never Smoker  . Smokeless tobacco: Never Used  . Alcohol use 0.0 oz/week     Comment: rare     Allergies   Bee venom and Adhesive [tape]   Review of Systems Review of Systems  Constitutional: Negative for fever.  Respiratory: Negative for wheezing.   Cardiovascular: Negative for chest pain.  Gastrointestinal: Negative for abdominal pain.  Neurological: Negative for syncope.  All other systems reviewed and are negative.    Physical Exam Updated Vital Signs BP 113/80 (BP Location: Right Arm)   Pulse 97   Temp 97.5 F (36.4 C) (Axillary)   Resp 20   SpO2 90%   Physical Exam  Constitutional: No distress.  Elderly, frail  HENT:  Head: Normocephalic and atraumatic.  Right Ear: External ear normal.  Left Ear:  External ear normal.  Eyes: Conjunctivae are normal. Right eye exhibits no discharge. Left eye exhibits no discharge. No scleral icterus.  Neck: Neck supple. No tracheal deviation present.  Cardiovascular: Normal rate, regular rhythm and intact distal pulses.   Pulmonary/Chest: Effort normal and breath sounds normal. No stridor. No respiratory distress. She has no wheezes. She has no rales.  Abdominal: Soft. Bowel sounds are normal. She exhibits no distension. There is no tenderness. There is no rebound and no guarding.  Musculoskeletal: She exhibits edema and tenderness.  Patient appears to have pain when I palpate the distal aspect of her left humerus, edema noted, no swelling or deformity noted of the forearm, no olecranon swelling or tenderness, shoulder without edema or tenderness; all extremities are atrophic and contracted  Neurological: She is alert. She displays atrophy. No cranial nerve deficit (no facial droop, extraocular movements intact, patient does not speak). She exhibits abnormal muscle tone. She displays no seizure activity. Coordination normal.  Contractures, patient looks at me when I speak to her but she does not answer any questions  Skin: Skin is warm and dry. No rash noted.  Psychiatric: She has a normal mood and affect.  Nursing note and vitals reviewed.    ED Treatments / Results    Radiology Dg Elbow 2 Views Left  Result Date: 01/17/2016 CLINICAL DATA:  Left humerus and elbow pain. Pt's son reports that pt has therapy to help with contracted extremities - today there was an audible pop when moving left arm. Best images sent. Tech held patient during imaging. Unable to extend arm for AP view. EXAM: LEFT ELBOW - 2 VIEW COMPARISON:  None. FINDINGS: RIGHT supracondylar fracture is obliquely oriented through the distal humeral metaphysis. There is lateral displacement of the distal fracture fragment and override. IMPRESSION: Oblique supracondylar fracture with lateral  displacement and override. Electronically Signed   By: Suzy Bouchard M.D.   On: 01/17/2016 15:06   Dg Humerus Left  Result Date: 01/17/2016 CLINICAL DATA:  Felipa Evener a pop moving left arm today EXAM: LEFT HUMERUS - 2+ VIEW COMPARISON:  None. FINDINGS: Two views of the left kidney measures  submitted. There is spiral displaced fracture in distal shaft of the left humerus. IMPRESSION: Spiral displaced fracture distal shaft of left humerus. Electronically Signed   By: Lahoma Crocker M.D.   On: 01/17/2016 15:07    Procedures Procedures (including critical care time)  Medications Ordered in ED Medications  HYDROcodone-acetaminophen (HYCET) 7.5-325 mg/15 ml solution 5 mL (0 mLs Oral Hold 01/17/16 1508)     Initial Impression / Assessment and Plan / ED Course  I have reviewed the triage vital signs and the nursing notes.  Pertinent labs & imaging results that were available during my care of the patient were reviewed by me and considered in my medical decision making (see chart for details).  Clinical Course as of Jan 17 1712  Thu Jan 17, 2016  1613 DG Humerus Left [JK]  1640 I Reviewed films with the family.   At baseline pt does not use her arm or get out of bed.  Discussed with Dr Delfino Lovett.  He will review the films and call back with recommendaitons  [JK]    Clinical Course User Index [JK] Dorie Rank, MD   Dr. Stann Mainland called back. He reviewed the films as well. He agrees that the patient could benefit from surgery however considering her comorbidities that may not be the best option. For now he recommended splinting. Patient can follow up in the office where he can discuss with the patient and the family further treatment options.  Final Clinical Impressions(s) / ED Diagnoses   Final diagnoses:  Other closed displaced fracture of distal end of left humerus, initial encounter    New Prescriptions New Prescriptions   HYDROCODONE-ACETAMINOPHEN (HYCET) 7.5-325 MG/15 ML SOLUTION    Take 5 mLs by  mouth 4 (four) times daily as needed for moderate pain.     Dorie Rank, MD 01/17/16 1714

## 2016-01-17 NOTE — ED Triage Notes (Signed)
Pt has palsy.  Pt has to have extremities exercise daily.  Today felt pop in left elbow.

## 2016-01-17 NOTE — Progress Notes (Addendum)
EDCM spoke to patient's son at bedside regarding wheelchair for patient.  Son reports the break on the wheelchair doesn't work and the patient des not sit in this wheelchair at all.  He reports he would like a new wheelchair for his mother, one that has the ability to lean back.  Patient's son agreeable to allow [facility] to call the [facility] to inform them of this request.  EDCM placed call to [facility] who will place call to on call nurse.  Awaiting call back.  1750pm  EDCM spoke to on call RN Santiago Glad of the [facility] regarding patient's ED visit and need for new wheelchair.  Santiago Glad reports she will inform patient's primary nurse and will get in touch with patient's son tomorrow.  No further EDCM needs at this time.

## 2016-01-17 NOTE — Telephone Encounter (Signed)
I would recommend for him to contact the hospice nurse for the patient because they will know all the details about coverage for equipment under hospice and it will likely have to be ordered through hospice MD. I noticed that patient is in the ED and if you speak to the son while patient is still in the ED then I would recommend him notifying the ED Case Manager and they could notify the hospice agency.

## 2016-01-17 NOTE — Telephone Encounter (Signed)
Called patient's son back and LMOM making him aware.

## 2016-01-17 NOTE — Telephone Encounter (Signed)
Isabella George 2032-08-03. Her son's # [id number] . He is needing to know how to go about getting another wheelchair for his mother. The one she has is just not working for her. Please lmom with any information you would have. Thank you

## 2016-01-17 NOTE — Telephone Encounter (Signed)
[name] with Hospice called and stated that patient has 24hr caregivers in home, they were repositioning her this morning in her gerichair and the Left upper extremity was extremely contracted and very tight and during transfer they heard a loud pop and now that joint is moveable where as before it was not. They wanted to make you aware. They are not sure what the son is going to want to do if anything.

## 2016-01-17 NOTE — Telephone Encounter (Signed)
Patient on hospice. What are the rules for obtaining new wheelchair?

## 2016-01-18 NOTE — Telephone Encounter (Signed)
I saw that she had a fracture during the repositioning when Aaron Edelman took her to the ED.  Surgery was indicated, but not recommended given her advanced PSP and goals of care.  Splinting was going to be performed.  If they need any new med orders for better pain control, please let me know.

## 2016-01-21 ENCOUNTER — Telehealth: Payer: Self-pay | Admitting: Gynecology

## 2016-01-21 NOTE — Telephone Encounter (Signed)
Phone call from son Myrla Amerson asking about Prolia for his Mom. I read Dr Phineas Real note referring Prolia injections to PCP left VM for Janus Molder.

## 2016-01-28 NOTE — Telephone Encounter (Signed)
Phone call to Isabella George explained that We are unable to discuss any medical information with him due to no DPR has been given for [facility]. He understands and will provide this information for POA in the event that he needs to discuss his mother's medical care with Dr Phineas Real .

## 2016-02-01 ENCOUNTER — Emergency Department (HOSPITAL_COMMUNITY)

## 2016-02-01 ENCOUNTER — Emergency Department (HOSPITAL_COMMUNITY)
Admission: EM | Admit: 2016-02-01 | Discharge: 2016-02-01 | Disposition: A | Discharge status: Home / Self Care | Attending: Emergency Medicine | Admitting: Emergency Medicine

## 2016-02-01 ENCOUNTER — Encounter (HOSPITAL_COMMUNITY): Payer: Self-pay | Admitting: Emergency Medicine

## 2016-02-01 DIAGNOSIS — Z853 Personal history of malignant neoplasm of breast: Secondary | ICD-10-CM | POA: Diagnosis not present

## 2016-02-01 DIAGNOSIS — Z4801 Encounter for change or removal of surgical wound dressing: Secondary | ICD-10-CM | POA: Insufficient documentation

## 2016-02-01 DIAGNOSIS — T148XXA Other injury of unspecified body region, initial encounter: Secondary | ICD-10-CM

## 2016-02-01 DIAGNOSIS — I10 Essential (primary) hypertension: Secondary | ICD-10-CM | POA: Diagnosis not present

## 2016-02-01 DIAGNOSIS — Z7982 Long term (current) use of aspirin: Secondary | ICD-10-CM | POA: Diagnosis not present

## 2016-02-01 DIAGNOSIS — Z5189 Encounter for other specified aftercare: Secondary | ICD-10-CM

## 2016-02-01 DIAGNOSIS — G20 Parkinson's disease: Secondary | ICD-10-CM | POA: Insufficient documentation

## 2016-02-01 LAB — CBC WITH DIFFERENTIAL/PLATELET
Basophils Absolute: 0 10*3/uL (ref 0.0–0.1)
Basophils Relative: 1 %
Eosinophils Absolute: 0 10*3/uL (ref 0.0–0.7)
Eosinophils Relative: 1 %
HEMATOCRIT: 35.7 % — AB (ref 36.0–46.0)
Hemoglobin: 11.8 g/dL — ABNORMAL LOW (ref 12.0–15.0)
LYMPHS PCT: 18 %
Lymphs Abs: 1.4 10*3/uL (ref 0.7–4.0)
MCH: 31.3 pg (ref 26.0–34.0)
MCHC: 33.1 g/dL (ref 30.0–36.0)
MCV: 94.7 fL (ref 78.0–100.0)
Monocytes Absolute: 0.8 10*3/uL (ref 0.1–1.0)
Monocytes Relative: 11 %
NEUTROS ABS: 5.5 10*3/uL (ref 1.7–7.7)
NEUTROS PCT: 69 %
PLATELETS: 327 10*3/uL (ref 150–400)
RBC: 3.77 MIL/uL — AB (ref 3.87–5.11)
RDW: 14.6 % (ref 11.5–15.5)
WBC: 7.8 10*3/uL (ref 4.0–10.5)

## 2016-02-01 LAB — BASIC METABOLIC PANEL
ANION GAP: 5 (ref 5–15)
BUN: 14 mg/dL (ref 6–20)
CO2: 26 mmol/L (ref 22–32)
Calcium: 8.5 mg/dL — ABNORMAL LOW (ref 8.9–10.3)
Chloride: 106 mmol/L (ref 101–111)
Creatinine, Ser: 0.42 mg/dL — ABNORMAL LOW (ref 0.44–1.00)
GFR calc Af Amer: >60 mL/min (ref >60)
GLUCOSE: 85 mg/dL (ref 65–99)
POTASSIUM: 3.9 mmol/L (ref 3.5–5.1)
Sodium: 137 mmol/L (ref 135–145)

## 2016-02-01 MED ORDER — BACITRACIN ZINC 500 UNIT/GM EX OINT
TOPICAL_OINTMENT | CUTANEOUS | Status: AC
  Filled 2016-02-01: qty 0.9

## 2016-02-01 MED ORDER — CEPHALEXIN 250 MG/5ML PO SUSR: 250.0000 mg | Freq: Four times a day (QID) | ORAL | 0 refills | Status: AC

## 2016-02-01 NOTE — ED Triage Notes (Signed)
Patient here from home with complaints of wound to left elbow. States that this is new since cast was removed from left arm 1 week ago.

## 2016-02-01 NOTE — ED Notes (Signed)
Bed: [facility] Expected date:  Expected time:  Means of arrival:  Comments: Left elbow check, ? Change when reapplying splint,

## 2016-02-01 NOTE — ED Provider Notes (Signed)
[facility] DEPT Provider Note   CSN: [id number] Arrival date & time: 02/01/16  1717     History   Chief Complaint Chief Complaint  Patient presents with  . Wound Check    HPI Isabella George is a 82 y.o. female.  Patient to the emergency department with chief complaint of left elbow support. She does not indicate. The history is given via her son. Patient recently sustained a left distal humerus fracture. The splint was recently removed, and the caretaker noticed that she has a sore on her skin overlying the left elbow. They deny any discharge or fever. Patient has been normal for her baseline.  Patient is DO NOT RESUSCITATE/DO NOT INTUBATE   The history is provided by a relative. No language interpreter was used.    Past Medical History:  Diagnosis Date  . Abnormality of gait   . Acid reflux   . Acute posthemorrhagic anemia 06/2011  . Allergic rhinitis due to pollen   . Anemia, unspecified   . Aneurysm of unspecified site 07/08/2011  . Cancer [facility])    Breast Cancer  . Chest pain, unspecified 02/10/2011  . Closed fracture of intertrochanteric section of femur ([place]) 07/07/2011  . Closed fracture of rib(s), unspecified 12/27/2010  . Closed fracture of two ribs 04/14/2011  . Contracture of finger joint 07/25/2013   Left 2,3,4 fingers  . Contusion of toe 05/26/2011  . Debility, unspecified 07/25/2011  . Depressive disorder, not elsewhere classified 2012  . Diaphragmatic hernia without mention of obstruction or gangrene 04/14/2011  . Disturbance of skin sensation 05/26/2011  . Dizziness and giddiness 06/23/2011  . Esophagitis, unspecified 04/14/2011  . Hip fracture ([place])   . Hyperlipidemia   . Loss of weight 04/14/2011  . Malignant neoplasm of breast (female), unspecified site 65   s/p left mastectomy  . Memory loss   . Osteoarthritis of both knees   . Osteoarthrosis, unspecified whether generalized or localized, unspecified site 12/17/2011  . Osteoporosis, senile   .  Personal history of fall 06/23/2011  . Restless leg syndrome 08/04/2012  . Rhinitis, non-allergic   . Scoliosis (and kyphoscoliosis), idiopathic   . Sleep disturbance, unspecified   . Ulcer of esophagus 02/25/2011  . Unspecified essential hypertension   . Unspecified hereditary and idiopathic peripheral neuropathy 02/05/2011  . Unspecified urinary incontinence 12/27/2010  . Unspecified venous (peripheral) insufficiency 07/08/2011    Patient Active Problem List   Diagnosis Date Noted  . Palliative care encounter 03/09/2015  . DNR (do not resuscitate) 03/09/2015  . Dysphagia   . Acute encephalopathy   . UTI (lower urinary tract infection) 03/07/2015  . 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 Parkinsonism ([place]) 03/07/2015  . Hyperlipidemia   . Apgar appearance score 1, pink body, bluish hands or feet 05/09/2014  . Cough 04/05/2014  . Dysphagia, pharyngoesophageal phase 10/25/2013  . Contracture of finger joint 07/25/2013  . Muscle spasticity 05/20/2013  . Mild cognitive impairment with memory loss 12/23/2012  . Encounter for long-term (current) use of other medications 12/22/2012  . Rhinitis, non-allergic   . Osteoarthritis of both knees   . Restless leg syndrome 08/04/2012  . Sleep disturbance, unspecified 06/23/2012  . Depressive disorder, not elsewhere classified 06/23/2012  . Essential hypertension   . Hip fracture ([place])   . Fracture of left hip ([place]) 07/03/2011  . Osteoporosis 07/03/2011  . Breast cancer ([place]) 07/03/2011  . Cancer ([place])   . Acid reflux   . Abnormality of gait 06/24/2007  Past Surgical History:  Procedure Laterality Date  . Anal fissuer    . ANAL FISSURE REPAIR    . APPENDECTOMY    . BREAST SURGERY  1986   Left mastectomy  . CATARACT EXTRACTION W/ INTRAOCULAR LENS IMPLANT  2000   right  . COLONOSCOPY W/ BIOPSIES  02/21/2011   polyp-hyperplastic polyp, no adenomatous change or malignancy Dr. Cristina Gong  . ESOPHAGOGASTRODUODENOSCOPY  ENDOSCOPY  [id number]   Dr. Cristina Gong with biopsy gastroesophageal junction mucosa with focal ulceration no intestinal metaplasia, dysphasia, or malignancy. Does have a hiatal hernia with esophagitis  . FEMUR IM NAIL  07/03/2011   Procedure: INTRAMEDULLARY (IM) NAIL FEMORAL;  Surgeon: Melina Schools, MD;  Location: [facility] ORS;  Service: Orthopedics;  Laterality: Left;  Synthes troch nail, jackson bed, c-arm  . HAND SURGERY    . HIP FRACTURE SURGERY    . ORIF ACETABULAR FRACTURE  07/03/2011   left hip Dr. Rolena Infante  . TONGUE SURGERY    . TONGUE SURGERY  2000   lump on back of tongue Dr. Owens Shark  . TONSILLECTOMY    . VARICOSE VEIN SURGERY     Ligation of vein    OB History    Gravida Para Term Preterm AB Living   3 2 2   1 2    SAB TAB Ectopic Multiple Live Births                   Home Medications    Prior to Admission medications   Medication Sig Start Date End Date Taking? Authorizing Provider  acetaminophen (TYLENOL) 160 MG/5ML liquid Take 500 mg by mouth every 4 (four) hours as needed for pain.    Historical Provider, MD  Ascorbic Acid (VITAMIN C) POWD Take 1,200 mg by mouth daily.    Historical Provider, MD  aspirin 81 MG chewable tablet Chew 81 mg by mouth daily.    Historical Provider, MD  B Complex-Folic Acid (B COMPLEX-VITAMIN B12 PO) Take 5 mLs by mouth daily.    Historical Provider, MD  baclofen (LIORESAL) 10 MG tablet Take 1 tablet (10 mg total) by mouth 3 (three) times daily. Patient not taking: Reported on 01/17/2016 10/24/15   Tiffany L Reed, DO  bifidobacterium infantis (ALIGN) capsule Take 1 capsule by mouth daily.    Historical Provider, MD  Cholecalciferol (VITAMIN D3) LIQD Take 1 drop by mouth daily. Reported on 03/16/2015    Historical Provider, MD  denosumab (PROLIA) 60 MG/ML SOLN injection Inject 60 mg into the skin every 6 (six) months. Reported on 03/16/2015    Historical Provider, MD  donepezil (ARICEPT) 5 MG tablet Take 1 tablet (5 mg total) by mouth at bedtime. Patient not  taking: Reported on 01/17/2016 03/10/15   Modena Jansky, MD  EPINEPHrine 0.3 mg/0.3 mL IJ SOAJ injection Inject 0.3 mLs (0.3 mg total) into the muscle once. 10/11/14   Tiffany L Reed, DO  HYDROcodone-acetaminophen (HYCET) 7.5-325 mg/15 ml solution Take 5 mLs by mouth 4 (four) times daily as needed for moderate pain. 01/17/16 01/16/17  Dorie Rank, MD  influenza vac recom quadrivalent (FLUBLOK) 0.5 ML injection Inject 0.5 mLs into the muscle once.    Historical Provider, MD  ipratropium-albuterol (DUONEB) 0.5-2.5 (3) MG/3ML SOLN Take 3 mLs by nebulization every 6 (six) hours as needed (congestion, wheezing, coughing). Dx acute bronchitis with bronchospasm Patient not taking: Reported on 01/17/2016 01/31/15   Tiffany L Reed, DO  sorbitol 70 % solution Take 15 mLs by mouth daily as needed (for constipation).  Historical Provider, MD    Family History Family History  Problem Relation Age of Onset  . Heart disease Mother   . Heart disease Brother   . Heart attack Brother   . Cancer Father     Liver cancer  . COPD Brother   . Deep vein thrombosis Brother   . Varicose Veins Brother   . Hyperlipidemia Son   . Diabetes Paternal Aunt     Social History Social History  Substance Use Topics  . Smoking status: Never Smoker  . Smokeless tobacco: Never Used  . Alcohol use 0.0 oz/week     Comment: rare     Allergies   Bee venom and Adhesive [tape]   Review of Systems Review of Systems  Skin: Positive for wound.  All other systems reviewed and are negative.    Physical Exam Updated Vital Signs BP 124/82 (BP Location: Right Arm)   Pulse 61   Temp 98.5 F (36.9 C) (Oral)   Resp 18   SpO2 99%   Physical Exam  Constitutional: No distress.  HENT:  Head: Normocephalic and atraumatic.  Eyes: Conjunctivae and EOM are normal. Pupils are equal, round, and reactive to light.  Neck: No tracheal deviation present.  Cardiovascular: Normal rate.   Pulmonary/Chest: Effort normal. No  respiratory distress.  Abdominal: Soft.  Musculoskeletal: Normal range of motion.  Contractures of extremities  Neurological: She is alert.  Skin: Skin is warm and dry. She is not diaphoretic.  Very small pencil eraser-sized sore to left elbow without marked cellulitis, discharge, or evidence of abscess  Psychiatric: Judgment normal.  Nursing note and vitals reviewed.    ED Treatments / Results  Labs (all labs ordered are listed, but only abnormal results are displayed) Labs Reviewed  CBC WITH DIFFERENTIAL/PLATELET - Abnormal; Notable for the following:       Result Value   RBC 3.77 (*)    Hemoglobin 11.8 (*)    HCT 35.7 (*)    All other components within normal limits  BASIC METABOLIC PANEL - Abnormal; Notable for the following:    Creatinine, Ser 0.42 (*)    Calcium 8.5 (*)    All other components within normal limits    EKG  EKG Interpretation None       Radiology Dg Elbow 2 Views Left  Result Date: 02/01/2016 CLINICAL DATA:  Elbow swelling, history of known left humeral fracture EXAM: LEFT ELBOW - 2 VIEW COMPARISON:  01/17/2016 FINDINGS: The known left humeral fracture is again seen with some very mild callus formation identified. There remains significant displacement of the distal fracture fragment with respect to the proximal fracture fragment. This appears to have increased somewhat in the interval from the prior exam. Soft tissue swelling is seen. No acute fracture is noted. IMPRESSION: Findings consistent with the known distal left humeral fracture. Some widening of the fracture line is seen although some mild callus formation is noted. Electronically Signed   By: Inez Catalina M.D.   On: 02/01/2016 18:08    Procedures Procedures (including critical care time)  Medications Ordered in ED Medications - No data to display   Initial Impression / Assessment and Plan / ED Course  I have reviewed the triage vital signs and the nursing notes.  Pertinent labs &  imaging results that were available during my care of the patient were reviewed by me and considered in my medical decision making (see chart for details).  Clinical Course     Patient seen by  and discussed with Dr. Johnney Killian, who recommends discharge with antibiotics and close orthopedic follow-up. Recommends placing bulky dressing over the pressure ulcer. Patient's family members and power of attorney agree with the treatment plan. Patient is stable and ready for discharge.  Final Clinical Impressions(s) / ED Diagnoses   Final diagnoses:  Visit for wound check    New Prescriptions New Prescriptions   CEPHALEXIN (KEFLEX) 250 MG/5ML SUSPENSION    Take 5 mLs (250 mg total) by mouth 4 (four) times daily.     Montine Circle, PA-C 02/01/16 2040    Charlesetta Shanks, MD 02/04/16 [phone]

## 2016-02-01 NOTE — ED Notes (Signed)
Bed: [id number] Expected date: 02/01/16 Expected time: 5:54 PM Means of arrival: Ambulance Comments: Low Calcium

## 2016-03-20 ENCOUNTER — Encounter (HOSPITAL_COMMUNITY): Payer: Self-pay | Admitting: Emergency Medicine

## 2016-03-20 ENCOUNTER — Emergency Department (HOSPITAL_COMMUNITY)

## 2016-03-20 ENCOUNTER — Inpatient Hospital Stay (HOSPITAL_COMMUNITY)
Admission: EM | Admit: 2016-03-20 | Discharge: 2016-03-28 | DRG: 871 | Disposition: A | Discharge status: Hospice | Attending: Internal Medicine | Admitting: Internal Medicine

## 2016-03-20 DIAGNOSIS — Z888 Allergy status to other drugs, medicaments and biological substances status: Secondary | ICD-10-CM

## 2016-03-20 DIAGNOSIS — Z681 Body mass index (BMI) 19 or less, adult: Secondary | ICD-10-CM

## 2016-03-20 DIAGNOSIS — J9600 Acute respiratory failure, unspecified whether with hypoxia or hypercapnia: Secondary | ICD-10-CM

## 2016-03-20 DIAGNOSIS — A419 Sepsis, unspecified organism: Secondary | ICD-10-CM | POA: Diagnosis present

## 2016-03-20 DIAGNOSIS — Z8249 Family history of ischemic heart disease and other diseases of the circulatory system: Secondary | ICD-10-CM

## 2016-03-20 DIAGNOSIS — T68XXXA Hypothermia, initial encounter: Secondary | ICD-10-CM

## 2016-03-20 DIAGNOSIS — J9601 Acute respiratory failure with hypoxia: Secondary | ICD-10-CM | POA: Diagnosis present

## 2016-03-20 DIAGNOSIS — D72829 Elevated white blood cell count, unspecified: Secondary | ICD-10-CM | POA: Diagnosis present

## 2016-03-20 DIAGNOSIS — F329 Major depressive disorder, single episode, unspecified: Secondary | ICD-10-CM | POA: Diagnosis present

## 2016-03-20 DIAGNOSIS — J189 Pneumonia, unspecified organism: Secondary | ICD-10-CM | POA: Diagnosis not present

## 2016-03-20 DIAGNOSIS — Z7401 Bed confinement status: Secondary | ICD-10-CM

## 2016-03-20 DIAGNOSIS — M810 Age-related osteoporosis without current pathological fracture: Secondary | ICD-10-CM | POA: Diagnosis present

## 2016-03-20 DIAGNOSIS — E876 Hypokalemia: Secondary | ICD-10-CM | POA: Diagnosis present

## 2016-03-20 DIAGNOSIS — G231 Progressive supranuclear ophthalmoplegia [Steele-Richardson-Olszewski]: Secondary | ICD-10-CM | POA: Diagnosis present

## 2016-03-20 DIAGNOSIS — Z91030 Bee allergy status: Secondary | ICD-10-CM

## 2016-03-20 DIAGNOSIS — M20099 Other deformity of finger(s), unspecified finger(s): Secondary | ICD-10-CM | POA: Diagnosis present

## 2016-03-20 DIAGNOSIS — M170 Bilateral primary osteoarthritis of knee: Secondary | ICD-10-CM | POA: Diagnosis present

## 2016-03-20 DIAGNOSIS — J690 Pneumonitis due to inhalation of food and vomit: Secondary | ICD-10-CM | POA: Diagnosis not present

## 2016-03-20 DIAGNOSIS — E43 Unspecified severe protein-calorie malnutrition: Secondary | ICD-10-CM | POA: Diagnosis present

## 2016-03-20 DIAGNOSIS — M2450 Contracture, unspecified joint: Secondary | ICD-10-CM | POA: Diagnosis present

## 2016-03-20 DIAGNOSIS — R0600 Dyspnea, unspecified: Secondary | ICD-10-CM | POA: Diagnosis present

## 2016-03-20 DIAGNOSIS — Z66 Do not resuscitate: Secondary | ICD-10-CM | POA: Diagnosis not present

## 2016-03-20 DIAGNOSIS — K449 Diaphragmatic hernia without obstruction or gangrene: Secondary | ICD-10-CM | POA: Diagnosis present

## 2016-03-20 DIAGNOSIS — G2581 Restless legs syndrome: Secondary | ICD-10-CM | POA: Diagnosis present

## 2016-03-20 DIAGNOSIS — Z853 Personal history of malignant neoplasm of breast: Secondary | ICD-10-CM | POA: Diagnosis not present

## 2016-03-20 DIAGNOSIS — Y95 Nosocomial condition: Secondary | ICD-10-CM | POA: Diagnosis present

## 2016-03-20 DIAGNOSIS — Z515 Encounter for palliative care: Secondary | ICD-10-CM

## 2016-03-20 DIAGNOSIS — I959 Hypotension, unspecified: Secondary | ICD-10-CM

## 2016-03-20 DIAGNOSIS — R0603 Acute respiratory distress: Secondary | ICD-10-CM

## 2016-03-20 DIAGNOSIS — E785 Hyperlipidemia, unspecified: Secondary | ICD-10-CM | POA: Diagnosis present

## 2016-03-20 DIAGNOSIS — K219 Gastro-esophageal reflux disease without esophagitis: Secondary | ICD-10-CM | POA: Diagnosis not present

## 2016-03-20 DIAGNOSIS — M4120 Other idiopathic scoliosis, site unspecified: Secondary | ICD-10-CM | POA: Diagnosis present

## 2016-03-20 DIAGNOSIS — I10 Essential (primary) hypertension: Secondary | ICD-10-CM | POA: Diagnosis not present

## 2016-03-20 DIAGNOSIS — T68XXXD Hypothermia, subsequent encounter: Secondary | ICD-10-CM | POA: Diagnosis not present

## 2016-03-20 DIAGNOSIS — Z9012 Acquired absence of left breast and nipple: Secondary | ICD-10-CM

## 2016-03-20 DIAGNOSIS — Z79899 Other long term (current) drug therapy: Secondary | ICD-10-CM

## 2016-03-20 DIAGNOSIS — L8990 Pressure ulcer of unspecified site, unspecified stage: Secondary | ICD-10-CM | POA: Insufficient documentation

## 2016-03-20 DIAGNOSIS — Z7982 Long term (current) use of aspirin: Secondary | ICD-10-CM

## 2016-03-20 LAB — URINALYSIS, ROUTINE W REFLEX MICROSCOPIC
Bacteria, UA: NONE SEEN
Bilirubin Urine: NEGATIVE
GLUCOSE, UA: NEGATIVE mg/dL
Hgb urine dipstick: NEGATIVE
Ketones, ur: NEGATIVE mg/dL
LEUKOCYTES UA: NEGATIVE
Nitrite: NEGATIVE
PROTEIN: 30 mg/dL — AB
Specific Gravity, Urine: 1.021 (ref 1.005–1.030)
pH: 5 (ref 5.0–8.0)

## 2016-03-20 LAB — INFLUENZA PANEL BY PCR (TYPE A & B)
Influenza A By PCR: NEGATIVE
Influenza B By PCR: NEGATIVE

## 2016-03-20 LAB — LACTIC ACID, PLASMA: Lactic Acid, Venous: 3.5 mmol/L (ref 0.5–1.9)

## 2016-03-20 LAB — I-STAT TROPONIN, ED: Troponin i, poc: 0.02 ng/mL (ref 0.00–0.08)

## 2016-03-20 LAB — COMPREHENSIVE METABOLIC PANEL
ALBUMIN: 3.5 g/dL (ref 3.5–5.0)
ALT: 29 U/L (ref 14–54)
AST: 32 U/L (ref 15–41)
Alkaline Phosphatase: 77 U/L (ref 38–126)
Anion gap: 12 (ref 5–15)
BUN: 12 mg/dL (ref 6–20)
CHLORIDE: 98 mmol/L — AB (ref 101–111)
CO2: 25 mmol/L (ref 22–32)
Calcium: 8.7 mg/dL — ABNORMAL LOW (ref 8.9–10.3)
Creatinine, Ser: 0.47 mg/dL (ref 0.44–1.00)
GFR calc Af Amer: >60 mL/min (ref >60)
Glucose, Bld: 191 mg/dL — ABNORMAL HIGH (ref 65–99)
POTASSIUM: 4.1 mmol/L (ref 3.5–5.1)
Sodium: 135 mmol/L (ref 135–145)
Total Bilirubin: 0.8 mg/dL (ref 0.3–1.2)
Total Protein: 6.2 g/dL — ABNORMAL LOW (ref 6.5–8.1)

## 2016-03-20 LAB — MAGNESIUM: Magnesium: 1.8 mg/dL (ref 1.7–2.4)

## 2016-03-20 LAB — CBC WITH DIFFERENTIAL/PLATELET
BASOS ABS: 0 10*3/uL (ref 0.0–0.1)
BASOS PCT: 0 %
EOS PCT: 0 %
Eosinophils Absolute: 0 10*3/uL (ref 0.0–0.7)
HCT: 43.3 % (ref 36.0–46.0)
Hemoglobin: 14.9 g/dL (ref 12.0–15.0)
Lymphocytes Relative: 16 %
Lymphs Abs: 1.7 10*3/uL (ref 0.7–4.0)
MCH: 32.2 pg (ref 26.0–34.0)
MCHC: 34.4 g/dL (ref 30.0–36.0)
MCV: 93.5 fL (ref 78.0–100.0)
MONO ABS: 0.2 10*3/uL (ref 0.1–1.0)
Monocytes Relative: 2 %
Neutro Abs: 8.7 10*3/uL — ABNORMAL HIGH (ref 1.7–7.7)
Neutrophils Relative %: 82 %
PLATELETS: 310 10*3/uL (ref 150–400)
RBC: 4.63 MIL/uL (ref 3.87–5.11)
RDW: 13.3 % (ref 11.5–15.5)
WBC: 10.6 10*3/uL — ABNORMAL HIGH (ref 4.0–10.5)

## 2016-03-20 LAB — BRAIN NATRIURETIC PEPTIDE: B NATRIURETIC PEPTIDE 5: 57.4 pg/mL (ref 0.0–100.0)

## 2016-03-20 LAB — I-STAT CG4 LACTIC ACID, ED: LACTIC ACID, VENOUS: 2.83 mmol/L — AB (ref 0.5–1.9)

## 2016-03-20 LAB — APTT: aPTT: 29 seconds (ref 24–36)

## 2016-03-20 LAB — PROTIME-INR
INR: 1.21
Prothrombin Time: 15.4 s — ABNORMAL HIGH (ref 11.4–15.2)

## 2016-03-20 LAB — MRSA PCR SCREENING: MRSA by PCR: NEGATIVE

## 2016-03-20 LAB — PROCALCITONIN: Procalcitonin: 4.7 ng/mL

## 2016-03-20 MED ORDER — ALBUTEROL SULFATE (2.5 MG/3ML) 0.083% IN NEBU
2.5000 mg | INHALATION_SOLUTION | RESPIRATORY_TRACT | Status: DC | PRN
  Filled 2016-03-20: qty 3

## 2016-03-20 MED ORDER — SODIUM CHLORIDE 0.9 % IV SOLN
INTRAVENOUS | Status: DC
  Administered 2016-03-21 (×2): via INTRAVENOUS

## 2016-03-20 MED ORDER — DEXTROSE 5 % IV SOLN
2.0000 g | Freq: Once | INTRAVENOUS | Status: AC
  Administered 2016-03-20: 2 g via INTRAVENOUS
  Filled 2016-03-20: qty 2

## 2016-03-20 MED ORDER — ENOXAPARIN SODIUM 40 MG/0.4ML ~~LOC~~ SOLN
40.0000 mg | SUBCUTANEOUS | Status: DC
  Administered 2016-03-21: 40 mg via SUBCUTANEOUS
  Filled 2016-03-20: qty 0.4

## 2016-03-20 MED ORDER — HYDROCODONE-ACETAMINOPHEN 7.5-325 MG/15ML PO SOLN: 5.0000 mL | Freq: Four times a day (QID) | ORAL | Status: DC | PRN

## 2016-03-20 MED ORDER — ONDANSETRON HCL 4 MG/2ML IJ SOLN: 4.0000 mg | Freq: Four times a day (QID) | INTRAMUSCULAR | Status: DC | PRN

## 2016-03-20 MED ORDER — VANCOMYCIN HCL IN DEXTROSE 1-5 GM/200ML-% IV SOLN: 1000.0000 mg | Freq: Once | INTRAVENOUS | Status: DC

## 2016-03-20 MED ORDER — OSELTAMIVIR PHOSPHATE 6 MG/ML PO SUSR
75.0000 mg | Freq: Two times a day (BID) | ORAL | Status: DC
  Filled 2016-03-20 (×3): qty 12.5

## 2016-03-20 MED ORDER — SODIUM CHLORIDE 0.9 % IV BOLUS (SEPSIS)
1000.0000 mL | Freq: Once | INTRAVENOUS | Status: AC
  Administered 2016-03-20: 1000 mL via INTRAVENOUS

## 2016-03-20 MED ORDER — GUAIFENESIN-DM 100-10 MG/5ML PO SYRP: 5.0000 mL | ORAL_SOLUTION | ORAL | Status: DC | PRN

## 2016-03-20 MED ORDER — ASPIRIN 81 MG PO CHEW: 81.0000 mg | CHEWABLE_TABLET | Freq: Every day | ORAL | Status: DC

## 2016-03-20 MED ORDER — IPRATROPIUM BROMIDE 0.02 % IN SOLN
0.5000 mg | Freq: Four times a day (QID) | RESPIRATORY_TRACT | Status: DC
  Administered 2016-03-20: 0.5 mg via RESPIRATORY_TRACT

## 2016-03-20 MED ORDER — IPRATROPIUM BROMIDE 0.02 % IN SOLN
0.5000 mg | RESPIRATORY_TRACT | Status: DC | PRN
  Filled 2016-03-20: qty 2.5

## 2016-03-20 MED ORDER — ACETAMINOPHEN 160 MG/5ML PO SOLN: 500.0000 mg | ORAL | Status: DC | PRN

## 2016-03-20 MED ORDER — VANCOMYCIN HCL IN DEXTROSE 1-5 GM/200ML-% IV SOLN
1000.0000 mg | Freq: Once | INTRAVENOUS | Status: DC
  Filled 2016-03-20: qty 200

## 2016-03-20 MED ORDER — DEXTROSE 5 % IV SOLN: 1.0000 g | INTRAVENOUS | Status: DC

## 2016-03-20 MED ORDER — ACETAMINOPHEN 325 MG PO TABS: 650.0000 mg | ORAL_TABLET | ORAL | Status: DC | PRN

## 2016-03-20 MED ORDER — SORBITOL 70 % PO SOLN
15.0000 mL | Freq: Every day | ORAL | Status: DC | PRN
  Filled 2016-03-20: qty 15

## 2016-03-20 MED ORDER — ACETAMINOPHEN 160 MG/5ML PO LIQD: 500.0000 mg | ORAL | Status: DC | PRN

## 2016-03-20 MED ORDER — IPRATROPIUM-ALBUTEROL 0.5-2.5 (3) MG/3ML IN SOLN: 3.0000 mL | Freq: Four times a day (QID) | RESPIRATORY_TRACT | Status: DC | PRN

## 2016-03-20 MED ORDER — ALBUTEROL SULFATE (2.5 MG/3ML) 0.083% IN NEBU
2.5000 mg | INHALATION_SOLUTION | Freq: Four times a day (QID) | RESPIRATORY_TRACT | Status: DC
  Administered 2016-03-20: 2.5 mg via RESPIRATORY_TRACT

## 2016-03-20 MED ORDER — SODIUM CHLORIDE 0.9 % IV BOLUS (SEPSIS)
500.0000 mL | Freq: Once | INTRAVENOUS | Status: AC
  Administered 2016-03-20: 500 mL via INTRAVENOUS

## 2016-03-20 MED ORDER — PIPERACILLIN-TAZOBACTAM 3.375 G IVPB
3.3750 g | Freq: Once | INTRAVENOUS | Status: DC
  Filled 2016-03-20: qty 50

## 2016-03-20 MED ORDER — VANCOMYCIN HCL 500 MG IV SOLR: 500.0000 mg | INTRAVENOUS | Status: DC

## 2016-03-20 MED ORDER — SODIUM CHLORIDE 0.9 % IV SOLN
Freq: Once | INTRAVENOUS | Status: AC
  Administered 2016-03-20: 999 mL/h via INTRAVENOUS

## 2016-03-20 NOTE — ED Triage Notes (Signed)
Per EMS patient from home for resp distress that started about hour ago. Patient is Hospice patient with DNR.  Patient labor breathing and no response from pain while getting stuck by EMS.  Patient on NRB, 146/84, CBG 178, RR 40,

## 2016-03-20 NOTE — Progress Notes (Signed)
Pharmacy Antibiotic Note  Isabella George is a 83 y.o. female currently under there care of home Hospice, presented to the ED on 03/20/16 with c/o SOB.  To start vancomycin and cefepime for suspected PNA.  - son reported that patient weighs about 41 kg. - wbc 10.6, scr 0.47 (crcl~28, rounded scr to 1)   Plan: - cefepime 2 gm IV x1, then 1 gm q24h - vancomycin 1000 mg IV x1, then 500 mg IV q24h  _______________________________  No data recorded.   Recent Labs Lab 03/20/16 1403 03/20/16 1413  WBC 10.6*  --   CREATININE 0.47  --   LATICACIDVEN  --  2.83*    CrCl cannot be calculated (Unknown ideal weight.).    Allergies  Allergen Reactions  . Bee Venom Anaphylaxis  . Adhesive [Tape] Rash     Thank you for allowing pharmacy to be a part of this patient's care.  Lynelle Doctor 03/20/2016 6:41 PM

## 2016-03-20 NOTE — ED Provider Notes (Signed)
[facility] DEPT Provider Note   CSN: [id number] Arrival date & time: 03/20/16  1315     History   Chief Complaint Chief Complaint  Patient presents with  . Respiratory Distress    HPI CYDNEI SOLOMONSON is a 83 y.o. female.  HPI Patient is brought in by EMS from home for respiratory distress. Patient has history of palsy and contractions and is on hospice. DO NOT RESUSCITATE paperwork at bedside. Per EMS patient began becoming more labored roughly one hour prior to presentation. Patient is nonverbal and unresponsive. Level V caveat applies. Past Medical History:  Diagnosis Date  . Abnormality of gait   . Acid reflux   . Acute posthemorrhagic anemia 06/2011  . Allergic rhinitis due to pollen   . Anemia, unspecified   . Aneurysm of unspecified site 07/08/2011  . Cancer [facility])    Breast Cancer  . Chest pain, unspecified 02/10/2011  . Closed fracture of intertrochanteric section of femur ([place]) 07/07/2011  . Closed fracture of rib(s), unspecified 12/27/2010  . Closed fracture of two ribs 04/14/2011  . Contracture of finger joint 07/25/2013   Left 2,3,4 fingers  . Contusion of toe 05/26/2011  . Debility, unspecified 07/25/2011  . Depressive disorder, not elsewhere classified 2012  . Diaphragmatic hernia without mention of obstruction or gangrene 04/14/2011  . Disturbance of skin sensation 05/26/2011  . Dizziness and giddiness 06/23/2011  . Esophagitis, unspecified 04/14/2011  . Hip fracture ([place])   . Hyperlipidemia   . Loss of weight 04/14/2011  . Malignant neoplasm of breast (female), unspecified site 18   s/p left mastectomy  . Memory loss   . Osteoarthritis of both knees   . Osteoarthrosis, unspecified whether generalized or localized, unspecified site 12/17/2011  . Osteoporosis, senile   . Personal history of fall 06/23/2011  . Restless leg syndrome 08/04/2012  . Rhinitis, non-allergic   . Scoliosis (and kyphoscoliosis), idiopathic   . Sleep disturbance, unspecified   . Ulcer of  esophagus 02/25/2011  . Unspecified essential hypertension   . Unspecified hereditary and idiopathic peripheral neuropathy 02/05/2011  . Unspecified urinary incontinence 12/27/2010  . Unspecified venous (peripheral) insufficiency 07/08/2011    Patient Active Problem List   Diagnosis Date Noted  . Palliative care encounter 03/09/2015  . DNR (do not resuscitate) 03/09/2015  . Dysphagia   . Acute encephalopathy   . UTI (lower urinary tract infection) 03/07/2015  . 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 Parkinsonism ([place]) 03/07/2015  . Hyperlipidemia   . Apgar appearance score 1, pink body, bluish hands or feet 05/09/2014  . Cough 04/05/2014  . Dysphagia, pharyngoesophageal phase 10/25/2013  . Contracture of finger joint 07/25/2013  . Muscle spasticity 05/20/2013  . Mild cognitive impairment with memory loss 12/23/2012  . Encounter for long-term (current) use of other medications 12/22/2012  . Rhinitis, non-allergic   . Osteoarthritis of both knees   . Restless leg syndrome 08/04/2012  . Sleep disturbance, unspecified 06/23/2012  . Depressive disorder, not elsewhere classified 06/23/2012  . Essential hypertension   . Hip fracture ([place])   . Fracture of left hip ([place]) 07/03/2011  . Osteoporosis 07/03/2011  . Breast cancer ([place]) 07/03/2011  . Cancer ([place])   . Acid reflux   . Abnormality of gait 06/24/2007    Past Surgical History:  Procedure Laterality Date  . Anal fissuer    . ANAL FISSURE REPAIR    . APPENDECTOMY    . BREAST SURGERY  1986   Left mastectomy  . CATARACT  EXTRACTION W/ INTRAOCULAR LENS IMPLANT  2000   right  . COLONOSCOPY W/ BIOPSIES  02/21/2011   polyp-hyperplastic polyp, no adenomatous change or malignancy Dr. Cristina Gong  . ESOPHAGOGASTRODUODENOSCOPY ENDOSCOPY  [id number]   Dr. Cristina Gong with biopsy gastroesophageal junction mucosa with focal ulceration no intestinal metaplasia, dysphasia, or malignancy. Does have a hiatal hernia with esophagitis   . FEMUR IM NAIL  07/03/2011   Procedure: INTRAMEDULLARY (IM) NAIL FEMORAL;  Surgeon: Melina Schools, MD;  Location: [facility] ORS;  Service: Orthopedics;  Laterality: Left;  Synthes troch nail, jackson bed, c-arm  . HAND SURGERY    . HIP FRACTURE SURGERY    . ORIF ACETABULAR FRACTURE  07/03/2011   left hip Dr. Rolena Infante  . TONGUE SURGERY    . TONGUE SURGERY  2000   lump on back of tongue Dr. Owens Shark  . TONSILLECTOMY    . VARICOSE VEIN SURGERY     Ligation of vein    OB History    Gravida Para Term Preterm AB Living   3 2 2   1 2    SAB TAB Ectopic Multiple Live Births                   Home Medications    Prior to Admission medications   Medication Sig Start Date End Date Taking? Authorizing Provider  acetaminophen (TYLENOL) 160 MG/5ML liquid Take 500 mg by mouth every 4 (four) hours as needed for pain.   Yes Historical Provider, MD  Ascorbic Acid (VITAMIN C) POWD Take 1,200 mg by mouth daily.   Yes Historical Provider, MD  aspirin 81 MG chewable tablet Chew 81 mg by mouth daily.   Yes Historical Provider, MD  B Complex-Folic Acid (B COMPLEX-VITAMIN B12 PO) Take 5 mLs by mouth daily.   Yes Historical Provider, MD  bifidobacterium infantis (ALIGN) capsule Take 1 capsule by mouth daily.   Yes Historical Provider, MD  Cholecalciferol (VITAMIN D3) LIQD Take 1 drop by mouth daily. Reported on 03/16/2015   Yes Historical Provider, MD  EPINEPHrine 0.3 mg/0.3 mL IJ SOAJ injection Inject 0.3 mLs (0.3 mg total) into the muscle once. Patient taking differently: Inject 0.3 mg into the muscle once as needed (allergies).  10/11/14  Yes Tiffany L Reed, DO  HYDROcodone-acetaminophen (HYCET) 7.5-325 mg/15 ml solution Take 5 mLs by mouth 4 (four) times daily as needed for moderate pain. 01/17/16 01/16/17 Yes Dorie Rank, MD  influenza vac recom quadrivalent (FLUBLOK) 0.5 ML injection Inject 0.5 mLs into the muscle once.   Yes Historical Provider, MD  ipratropium-albuterol (DUONEB) 0.5-2.5 (3) MG/3ML SOLN Take 3 mLs by  nebulization every 6 (six) hours as needed (congestion, wheezing, coughing). Dx acute bronchitis with bronchospasm 01/31/15  Yes Tiffany L Reed, DO  Multiple Vitamin (MULTIVITAMIN) LIQD Take 5 mLs by mouth 3 (three) times a week. No specific days of week   Yes Historical Provider, MD  Phenylephrine-DM (THERAFLU COLD/COUGH DAYTIME PO) Take 5 mLs by mouth every 6 (six) hours as needed (cold symptoms).   Yes Historical Provider, MD  sorbitol 70 % solution Take 15 mLs by mouth daily as needed (for constipation).   Yes Historical Provider, MD  baclofen (LIORESAL) 10 MG tablet Take 1 tablet (10 mg total) by mouth 3 (three) times daily. Patient not taking: Reported on 03/20/2016 10/24/15   Tiffany L Reed, DO  donepezil (ARICEPT) 5 MG tablet Take 1 tablet (5 mg total) by mouth at bedtime. Patient not taking: Reported on 03/20/2016 03/10/15   Modena Jansky,  MD    Family History Family History  Problem Relation Age of Onset  . Heart disease Mother   . Heart disease Brother   . Heart attack Brother   . Cancer Father     Liver cancer  . COPD Brother   . Deep vein thrombosis Brother   . Varicose Veins Brother   . Hyperlipidemia Son   . Diabetes Paternal Aunt     Social History Social History  Substance Use Topics  . Smoking status: Never Smoker  . Smokeless tobacco: Never Used  . Alcohol use 0.0 oz/week     Comment: rare     Allergies   Bee venom and Adhesive [tape]   Review of Systems Review of Systems  Unable to perform ROS: Dementia     Physical Exam Updated Vital Signs BP (!) 142/42   Pulse 77   Resp (!) 37   SpO2 100%   Physical Exam  Constitutional: She is oriented to person, place, and time. She appears well-developed.  Emaciated, respiratory distress  HENT:  Head: Normocephalic and atraumatic.  Dry mucous membranes  Eyes: EOM are normal. Pupils are equal, round, and reactive to light.  Neck: Normal range of motion. Neck supple. JVD present.  Cardiovascular:  Regular rhythm.   Tachycardia  Pulmonary/Chest: She is in respiratory distress. She has rales.  Crackles throughout. Accessory muscle use. Tachypnea.  Abdominal: Soft. Bowel sounds are normal. There is no tenderness. There is no rebound and no guarding.  Musculoskeletal: Normal range of motion. She exhibits no edema or tenderness.  Contracted extremities. No lower extremity asymmetry. Left upper in bulky dressing. Distal pulses intact.  Neurological: She is alert and oriented to person, place, and time.  The patient is not following commands. Nonverbal.  Skin: Skin is warm and dry. Capillary refill takes less than 2 seconds. No rash noted. No erythema.  Nursing note and vitals reviewed.    ED Treatments / Results  Labs (all labs ordered are listed, but only abnormal results are displayed) Labs Reviewed  CBC WITH DIFFERENTIAL/PLATELET - Abnormal; Notable for the following:       Result Value   WBC 10.6 (*)    Neutro Abs 8.7 (*)    All other components within normal limits  COMPREHENSIVE METABOLIC PANEL - Abnormal; Notable for the following:    Chloride 98 (*)    Glucose, Bld 191 (*)    Calcium 8.7 (*)    Total Protein 6.2 (*)    All other components within normal limits  I-STAT CG4 LACTIC ACID, ED - Abnormal; Notable for the following:    Lactic Acid, Venous 2.83 (*)    All other components within normal limits  CULTURE, BLOOD (ROUTINE X 2)  CULTURE, BLOOD (ROUTINE X 2)  BRAIN NATRIURETIC PEPTIDE  URINALYSIS, ROUTINE W REFLEX MICROSCOPIC  I-STAT TROPOININ, ED    EKG  EKG Interpretation None       Radiology Dg Chest Port 1 View  Result Date: 03/20/2016 CLINICAL DATA:  Shortness of breath.  Unresponsive patient. EXAM: PORTABLE CHEST 1 VIEW COMPARISON:  03/08/2015 FINDINGS: The cardiac silhouette is mildly enlarged. Mediastinal contours appear intact. There is no evidence of focal airspace consolidation, pleural effusion or pneumothorax. There is left lower lobe  atelectasis. Osseous structures are without acute abnormality. Postsurgical changes in the left chest wall. Soft tissues are otherwise grossly normal. IMPRESSION: Stably enlarged cardiac silhouette, mild. Left lower lobe atelectasis. Electronically Signed   By: Fidela Salisbury M.D.   On:  03/20/2016 14:09    Procedures Procedures (including critical care time)  Medications Ordered in ED Medications  piperacillin-tazobactam (ZOSYN) IVPB 3.375 g (not administered)  vancomycin (VANCOCIN) IVPB 1000 mg/200 mL premix (not administered)  sodium chloride 0.9 % bolus 500 mL (500 mLs Intravenous New Bag/Given 03/20/16 1635)     Initial Impression / Assessment and Plan / ED Course  I have reviewed the triage vital signs and the nursing notes.  Pertinent labs & imaging results that were available during my care of the patient were reviewed by me and considered in my medical decision making (see chart for details).  Clinical Course    Patient is more comfortable on BiPAP. Maintaining 100% saturations. Questionable right lower lobe infiltrate. Concern given acuity of decline for aspiration pneumonia. Hospice nurse at bedside to evaluate patient. Recommended inpatient treatment. Discussed with Dr. [place] Silos. We'll see patient in the emergency department. Long discussion with son regarding critical condition of patient. States he would like patient admitted for antibiotics to see if she improves. He understands this is likely futile.  Final Clinical Impressions(s) / ED Diagnoses   Final diagnoses:  Respiratory distress    New Prescriptions New Prescriptions   No medications on file     Julianne Rice, MD 03/20/16 1717

## 2016-03-20 NOTE — H&P (Signed)
History and Physical    Isabella George [id number] DOB: 29-Jul-1932 DOA: 03/20/2016  PCP: Hollace Kinnier, DO  Patient coming from: Home  Chief Complaint: Acute respiratory distress  HPI: Isabella George is a 83 y.o. female with medical history significant of progressive supranuclear palsy with contractures and currently on hospice, gastroesophageal reflux disease, osteoporosis, debility and bedbound who presents to the ED with a one-week history of worsening symptoms and 1 day history of sudden onset acute respiratory distress with patients breathing becoming more labored with increased work of breathing pallor. Patient is nonverbal at baseline and most of the history is obtained from patient's son who is a healthcare power of attorney. The patient's son over the past week patient has been slowly worsening in terms of her respiratory status as well as pallor with increased secretions and discolored productive sputum. Patient was noted a few days ago to have a fever with a temp as high as 101.5, increased respiratory rate, tachycardia. Over the past 48 hours patient has remained afebrile however has gradually been looking sicker per her son. Patient's son also endorses congestion, occasional periods of groaning and moaning. It was noted that the hospice nurse assessed the patient early on today and patient was noted to be fine however one hour after hospice last nurse at left patient's son noted that patient had become more pale with blue lips and labored breathing and a such EMS was called and patient brought to the emergency room. Patient's son denies any nausea, no vomiting, no melena, no hematemesis, no hematochezia, no abdominal pain, no dysuria. Does endorse patient has have urinary incontinence. Patient's son does endorse humeral fracture in the left upper extremity which occurred on November 9, which is being managed conservatively.   ED Course: Patient was seen in the ED and on original  presentation was noted to be 70% on a nonrebreather. Patient also noted to have a respiratory rate on presentation of 38 with a pulse of 100 sats as low as 72%. Patient noted to also have systolic blood pressures in the 80s. She was subsequently placed on the BiPAP with some improvement in the respiratory status ED physician. Chest x-ray which was done noted some left lower lobe atelectasis however. ED physician likely an aspiration pneumonia. Compressive metabolic profile obtained at a chloride of 98 glucose of 191 otherwise was within normal limits. Lactic acid level was 2.83. BNP was 57.4. Point-of-care troponin was negative. CBC had a white count of 10.6 of as was within normal limits. Urinalysis pending. Patient was given IV vancomycin and IV Zosyn per ED physician and chart hospice will call to admit the patient for further evaluation and management.    Review of Systems: As per HPI otherwise 10 point review of systems negative.  Past Medical History:  Diagnosis Date  . Abnormality of gait   . Acid reflux   . Acute posthemorrhagic anemia 06/2011  . Allergic rhinitis due to pollen   . Anemia, unspecified   . Aneurysm of unspecified site 07/08/2011  . Cancer [facility])    Breast Cancer  . Chest pain, unspecified 02/10/2011  . Closed fracture of intertrochanteric section of femur ([place]) 07/07/2011  . Closed fracture of rib(s), unspecified 12/27/2010  . Closed fracture of two ribs 04/14/2011  . Contracture of finger joint 07/25/2013   Left 2,3,4 fingers  . Contusion of toe 05/26/2011  . Debility, unspecified 07/25/2011  . Depressive disorder, not elsewhere classified 2012  . Diaphragmatic hernia without mention of obstruction  or gangrene 04/14/2011  . Disturbance of skin sensation 05/26/2011  . Dizziness and giddiness 06/23/2011  . Esophagitis, unspecified 04/14/2011  . Hip fracture ([place])   . Hyperlipidemia   . Loss of weight 04/14/2011  . Malignant neoplasm of breast (female), unspecified site 43    s/p left mastectomy  . Memory loss   . Osteoarthritis of both knees   . Osteoarthrosis, unspecified whether generalized or localized, unspecified site 12/17/2011  . Osteoporosis, senile   . Personal history of fall 06/23/2011  . Restless leg syndrome 08/04/2012  . Rhinitis, non-allergic   . Scoliosis (and kyphoscoliosis), idiopathic   . Sleep disturbance, unspecified   . Ulcer of esophagus 02/25/2011  . Unspecified essential hypertension   . Unspecified hereditary and idiopathic peripheral neuropathy 02/05/2011  . Unspecified urinary incontinence 12/27/2010  . Unspecified venous (peripheral) insufficiency 07/08/2011    Past Surgical History:  Procedure Laterality Date  . Anal fissuer    . ANAL FISSURE REPAIR    . APPENDECTOMY    . BREAST SURGERY  1986   Left mastectomy  . CATARACT EXTRACTION W/ INTRAOCULAR LENS IMPLANT  2000   right  . COLONOSCOPY W/ BIOPSIES  02/21/2011   polyp-hyperplastic polyp, no adenomatous change or malignancy Dr. Cristina Gong  . ESOPHAGOGASTRODUODENOSCOPY ENDOSCOPY  [id number]   Dr. Cristina Gong with biopsy gastroesophageal junction mucosa with focal ulceration no intestinal metaplasia, dysphasia, or malignancy. Does have a hiatal hernia with esophagitis  . FEMUR IM NAIL  07/03/2011   Procedure: INTRAMEDULLARY (IM) NAIL FEMORAL;  Surgeon: Melina Schools, MD;  Location: [facility];  Service: Orthopedics;  Laterality: Left;  Synthes troch nail, jackson bed, c-arm  . HAND SURGERY    . HIP FRACTURE SURGERY    . ORIF ACETABULAR FRACTURE  07/03/2011   left hip Dr. Rolena Infante  . TONGUE SURGERY    . TONGUE SURGERY  2000   lump on back of tongue Dr. Owens Shark  . TONSILLECTOMY    . VARICOSE VEIN SURGERY     Ligation of vein     reports that she has never smoked. She has never used smokeless tobacco. She reports that she drinks alcohol. She reports that she does not use drugs.  Allergies  Allergen Reactions  . Bee Venom Anaphylaxis  . Adhesive [Tape] Rash    Family History    Problem Relation Age of Onset  . Heart disease Mother   . Heart disease Brother   . Heart attack Brother   . Cancer Father     Liver cancer  . COPD Brother   . Deep vein thrombosis Brother   . Varicose Veins Brother   . Hyperlipidemia Son   . Diabetes Paternal Aunt    Unable to assess due to patient's condition and patient's son not to be sure of patient's parents cause of death. Per son patient's mother may have passed away in her 82s and did have a history of heart disease and was bedridden. Patient's father per son may have passed in his 37s did have a history of cancer and liver failure with a prior history of tobacco abuse and alcohol use.  Prior to Admission medications   Medication Sig Start Date End Date Taking? Authorizing Provider  acetaminophen (TYLENOL) 160 MG/5ML liquid Take 500 mg by mouth every 4 (four) hours as needed for pain.   Yes Historical Provider, MD  Ascorbic Acid (VITAMIN C) POWD Take 1,200 mg by mouth daily.   Yes Historical Provider, MD  aspirin 81 MG chewable tablet Chew  81 mg by mouth daily.   Yes Historical Provider, MD  B Complex-Folic Acid (B COMPLEX-VITAMIN B12 PO) Take 5 mLs by mouth daily.   Yes Historical Provider, MD  bifidobacterium infantis (ALIGN) capsule Take 1 capsule by mouth daily.   Yes Historical Provider, MD  Cholecalciferol (VITAMIN D3) LIQD Take 1 drop by mouth daily. Reported on 03/16/2015   Yes Historical Provider, MD  EPINEPHrine 0.3 mg/0.3 mL IJ SOAJ injection Inject 0.3 mLs (0.3 mg total) into the muscle once. Patient taking differently: Inject 0.3 mg into the muscle once as needed (allergies).  10/11/14  Yes Tiffany L Reed, DO  HYDROcodone-acetaminophen (HYCET) 7.5-325 mg/15 ml solution Take 5 mLs by mouth 4 (four) times daily as needed for moderate pain. 01/17/16 01/16/17 Yes Dorie Rank, MD  influenza vac recom quadrivalent (FLUBLOK) 0.5 ML injection Inject 0.5 mLs into the muscle once.   Yes Historical Provider, MD  ipratropium-albuterol  (DUONEB) 0.5-2.5 (3) MG/3ML SOLN Take 3 mLs by nebulization every 6 (six) hours as needed (congestion, wheezing, coughing). Dx acute bronchitis with bronchospasm 01/31/15  Yes Tiffany L Reed, DO  Multiple Vitamin (MULTIVITAMIN) LIQD Take 5 mLs by mouth 3 (three) times a week. No specific days of week   Yes Historical Provider, MD  Phenylephrine-DM (THERAFLU COLD/COUGH DAYTIME PO) Take 5 mLs by mouth every 6 (six) hours as needed (cold symptoms).   Yes Historical Provider, MD  sorbitol 70 % solution Take 15 mLs by mouth daily as needed (for constipation).   Yes Historical Provider, MD  baclofen (LIORESAL) 10 MG tablet Take 1 tablet (10 mg total) by mouth 3 (three) times daily. Patient not taking: Reported on 03/20/2016 10/24/15   Tiffany L Reed, DO  donepezil (ARICEPT) 5 MG tablet Take 1 tablet (5 mg total) by mouth at bedtime. Patient not taking: Reported on 03/20/2016 03/10/15   Modena Jansky, MD    Physical Exam: Vitals:   03/20/16 1615 03/20/16 1630 03/20/16 1645 03/20/16 1700  BP: 141/86 131/93 124/92 118/76  Pulse: (!) 35 (!) 35 72   Resp: (!) 36 (!) 36 (!) 36 (!) 37  SpO2: (!) 84% (!) 72% (!) 77%       Constitutional:  On gurney on BiPAP. Nonverbal. Cachectic. In contractures. Vitals:   03/20/16 1615 03/20/16 1630 03/20/16 1645 03/20/16 1700  BP: 141/86 131/93 124/92 118/76  Pulse: (!) 35 (!) 35 72   Resp: (!) 36 (!) 36 (!) 36 (!) 37  SpO2: (!) 84% (!) 72% (!) 77%    Eyes: PERRLA, lids and conjunctivae normal ENMT: Mucous membranes are dry. Posterior pharynx clear of any exudate or lesions. Neck: normal, supple, no masses, no thyromegaly Respiratory: Diffuse coarse rhonchorous breath sounds. No wheezing. No crackles. On BiPAP.  Cardiovascular: Tachycardic, no murmurs / rubs / gallops. No extremity edema. 1-2+ pedal pulses. No carotid bruits.  Abdomen: no tenderness, no masses palpated. No hepatosplenomegaly. Bowel sounds positive.  Musculoskeletal: no clubbing / cyanosis.  Patient in contractures bilateral upper extremities and lower extremities.  Skin: Some lower extremity discoloration on bilateral feet.  Neurologic: Unable to assess due to patient's current mentation. Patient nonverbal. Patient in contractures. Psychiatric: Unable to assess.  Labs on Admission: I have personally reviewed following labs and imaging studies  CBC:  Recent Labs Lab 03/20/16 1403  WBC 10.6*  NEUTROABS 8.7*  HGB 14.9  HCT 43.3  MCV 93.5  PLT 99991111   Basic Metabolic Panel:  Recent Labs Lab 03/20/16 1403  NA 135  K 4.1  CL 98*  CO2 25  GLUCOSE 191*  BUN 12  CREATININE 0.47  CALCIUM 8.7*   GFR: CrCl cannot be calculated (Unknown ideal weight.). Liver Function Tests:  Recent Labs Lab 03/20/16 1403  AST 32  ALT 29  ALKPHOS 77  BILITOT 0.8  PROT 6.2*  ALBUMIN 3.5   No results for input(s): LIPASE, AMYLASE in the last 168 hours. No results for input(s): AMMONIA in the last 168 hours. Coagulation Profile: No results for input(s): INR, PROTIME in the last 168 hours. Cardiac Enzymes: No results for input(s): CKTOTAL, CKMB, CKMBINDEX, TROPONINI in the last 168 hours. BNP (last 3 results) No results for input(s): PROBNP in the last 8760 hours. HbA1C: No results for input(s): HGBA1C in the last 72 hours. CBG: No results for input(s): GLUCAP in the last 168 hours. Lipid Profile: No results for input(s): CHOL, HDL, LDLCALC, TRIG, CHOLHDL, LDLDIRECT in the last 72 hours. Thyroid Function Tests: No results for input(s): TSH, T4TOTAL, FREET4, T3FREE, THYROIDAB in the last 72 hours. Anemia Panel: No results for input(s): VITAMINB12, FOLATE, FERRITIN, TIBC, IRON, RETICCTPCT in the last 72 hours. Urine analysis:    Component Value Date/Time   COLORURINE AMBER (A) 03/07/2015 1356   APPEARANCEUR CLOUDY (A) 03/07/2015 1356   LABSPEC 1.027 03/07/2015 1356   PHURINE 5.5 03/07/2015 1356   GLUCOSEU NEGATIVE 03/07/2015 1356   HGBUR MODERATE (A) 03/07/2015 1356    BILIRUBINUR NEGATIVE 03/07/2015 1356   KETONESUR 40 (A) 03/07/2015 1356   PROTEINUR NEGATIVE 03/07/2015 1356   UROBILINOGEN 0.2 07/03/2011 1300   NITRITE POSITIVE (A) 03/07/2015 1356   LEUKOCYTESUR NEGATIVE 03/07/2015 1356   Sepsis Labs: !!!!!!!!!!!!!!!!!!!!!!!!!!!!!!!!!!!!!!!!!!!! @LABRCNTIP (procalcitonin:4,lacticidven:4) )No results found for this or any previous visit (from the past 240 hour(s)).   Radiological Exams on Admission: Dg Chest Port 1 View  Result Date: 03/20/2016 CLINICAL DATA:  Shortness of breath.  Unresponsive patient. EXAM: PORTABLE CHEST 1 VIEW COMPARISON:  03/08/2015 FINDINGS: The cardiac silhouette is mildly enlarged. Mediastinal contours appear intact. There is no evidence of focal airspace consolidation, pleural effusion or pneumothorax. There is left lower lobe atelectasis. Osseous structures are without acute abnormality. Postsurgical changes in the left chest wall. Soft tissues are otherwise grossly normal. IMPRESSION: Stably enlarged cardiac silhouette, mild. Left lower lobe atelectasis. Electronically Signed   By: Fidela Salisbury M.D.   On: 03/20/2016 14:09    EKG: Not done  Assessment/Plan Principal Problem:   Acute respiratory failure with hypoxia (HCC) Active Problems:   Sepsis (HCC)   Acid reflux   Essential hypertension   Hyperlipidemia   PSP (progressive supranuclear palsy) (HCC)   DNR (do not resuscitate)   Aspiration pneumonia ([place])   Hypotension   #1 acute respiratory failure with hypoxia likely secondary to an aspiration pneumonia Percent patient with sudden onset of acute respiratory failure with hypoxia likely secondary to an aspiration pneumonia. Percent patient has been having worsening symptoms over the past week with a productive brownish sputum as well as increased secretions and also noted to have a fever a few days prior to admission of a temp of 101.5. Patient does have rhonchorous coarse breath sounds diet diffusely. Chest x-ray  concerning for atelectasis however patient is clinically dry. Patient also with increased O2 requirements and currently on the BiPAP. Patient has been pancultured. Check a urine Legionella antigen. Check a urine pneumococcus antigen. Check an influenza PCR. Place empirically on IV vancomycin and IV cefepime and Tamiflu. Continue BiPAP with hopes of titrating down to nasal cannula of facemask. Place  on scheduled nebulizers, Mucinex. If patient with no significant improvement over the next few days may need to shift her focus to full comfort. Speech therapy to assess for swallow evaluation. Will consulted palliative care medicine for goals of care. Family seems to want to treat reversible things at this point in time if able to. Patient will remain a DO NOT RESUSCITATE.  #2 sepsis Patient noted to be septic as patient is noted to be hypotensive and with hypoxia with increased respiratory rate. Patient also noted to have elevated lactic acid level. Likely secondary to aspiration pneumonia. Patient has been pancultured. Check an influenza PCR. Hydrate IV fluids. Continue empiric IV vancomycin and IV cefepime. Follow.  #3 hypotension Patient noted to be hypotensive during the hospitalization and in the ED likely multifactorial secondary to hypovolemia and infectious etiology. Patient has been pancultured. IV fluids. Placed empirically on IV vancomycin and IV cefepime. Follow.  #4 gastroesophageal reflux disease PPI.  #5 progressive supranuclear palsy Patient in contractures. Outpatient follow-up with neurologist.  #6 prognosis Patient with a poor prognosis. Patient with progressive supranuclear palsy and currently with hospice at home presented with acute respiratory failure with hypoxia with concerns for aspiration pneumonia. Patient is currently a DO NOT RESUSCITATE. It has been explained to patient's son who is a healthcare power of attorney that it is uncertain how well patient will do during this  hospitalization. Will try to treat a probable pneumonia, try to place on IV fluids for better blood pressure control and try to treat reversible things. If no significant improvement in the next 36-48 hours may need to consider transitioning to a full comfort measures. Will consulted palliative care for goals of care and further management.    DVT prophylaxis: Lovenox Code Status: DO NOT RESUSCITATE Family Communication: Updated son, Isabella George healthcare power of attorney Disposition Plan: Home versus residential hospice Consults called: Palliative care pending Admission status: Admit to stepdown unit inpatient.   [facility] MD [facility] Pager [phone]  If 7PM-7AM, please contact night-coverage [email or web address] Password [email or web address]  03/20/2016, 7:02 PM

## 2016-03-20 NOTE — Progress Notes (Addendum)
[facility] Liaison:  [facility] ED #12  This patient is currently active with Hospice and [place].  Patient's son, Aaron Edelman, decided to call 911 and have patient come to ED after worsening symptoms of SOB.   Patient had been seen approximately an hour previously by Hospice RN in the home.  Aaron Edelman states "mom took a turn for the worse, turned pale and couldn't breathe".  I visited patient in the ED, with son at bedside.  Patient currently lying in the bed in a fetal position, very pale in color, with increased respiration and WOB.  Patient is on bi-pap.  Spoke with Dr. Lita Mains who advised that patient will be admitted to hospital.  Son denies any questions or concerns at this time.  [facility] will continue to follow daily.  Please contact with any hospice related questions.  Thank you,  Edyth Gunnels, RN, BSN  [facility] Liaison [phone]  All hospital liaisons are now on [place].   **UPDATE - Spoke with Dr. Grandville Silos - Because this is an acute change in patient condition, the family has gone back and forth about care, it would be in the best interest of patient to determine [place] and get palliative care consult.

## 2016-03-20 NOTE — Progress Notes (Signed)
Pt transferred from [place] to [id number] while on BiPAP the whole time.  Pt remained stable and comfortable throughout trip.

## 2016-03-20 NOTE — ED Notes (Signed)
Patient IV infiltrated.  This RN attempted 2 IV access attempts, both unsuccessful.

## 2016-03-21 ENCOUNTER — Inpatient Hospital Stay (HOSPITAL_COMMUNITY)

## 2016-03-21 DIAGNOSIS — D72829 Elevated white blood cell count, unspecified: Secondary | ICD-10-CM

## 2016-03-21 DIAGNOSIS — J189 Pneumonia, unspecified organism: Secondary | ICD-10-CM

## 2016-03-21 LAB — CBC WITH DIFFERENTIAL/PLATELET
BASOS ABS: 0 10*3/uL (ref 0.0–0.1)
Basophils Relative: 0 %
Eosinophils Absolute: 0 10*3/uL (ref 0.0–0.7)
Eosinophils Relative: 0 %
HCT: 37.5 % (ref 36.0–46.0)
Hemoglobin: 12.6 g/dL (ref 12.0–15.0)
LYMPHS ABS: 0.6 10*3/uL — AB (ref 0.7–4.0)
Lymphocytes Relative: 2 %
MCH: 31.3 pg (ref 26.0–34.0)
MCHC: 33.6 g/dL (ref 30.0–36.0)
MCV: 93.3 fL (ref 78.0–100.0)
MONO ABS: 1.5 10*3/uL — AB (ref 0.1–1.0)
Monocytes Relative: 5 %
Neutro Abs: 27.5 10*3/uL — ABNORMAL HIGH (ref 1.7–7.7)
Neutrophils Relative %: 93 %
PLATELETS: 256 10*3/uL (ref 150–400)
RBC: 4.02 MIL/uL (ref 3.87–5.11)
RDW: 13.4 % (ref 11.5–15.5)
WBC: 29.6 10*3/uL — AB (ref 4.0–10.5)

## 2016-03-21 LAB — RESPIRATORY PANEL BY PCR
ADENOVIRUS-RVPPCR: NOT DETECTED
Bordetella pertussis: NOT DETECTED
CORONAVIRUS NL63-RVPPCR: NOT DETECTED
CORONAVIRUS OC43-RVPPCR: NOT DETECTED
Chlamydophila pneumoniae: NOT DETECTED
Coronavirus 229E: NOT DETECTED
Coronavirus HKU1: NOT DETECTED
INFLUENZA A-RVPPCR: NOT DETECTED
Influenza B: NOT DETECTED
MYCOPLASMA PNEUMONIAE-RVPPCR: NOT DETECTED
Metapneumovirus: NOT DETECTED
PARAINFLUENZA VIRUS 1-RVPPCR: NOT DETECTED
PARAINFLUENZA VIRUS 4-RVPPCR: NOT DETECTED
Parainfluenza Virus 2: NOT DETECTED
Parainfluenza Virus 3: NOT DETECTED
Respiratory Syncytial Virus: NOT DETECTED
Rhinovirus / Enterovirus: NOT DETECTED

## 2016-03-21 LAB — COMPREHENSIVE METABOLIC PANEL
ALK PHOS: 68 U/L (ref 38–126)
ALT: 30 U/L (ref 14–54)
AST: 32 U/L (ref 15–41)
Albumin: 3.3 g/dL — ABNORMAL LOW (ref 3.5–5.0)
Anion gap: 9 (ref 5–15)
BUN: 16 mg/dL (ref 6–20)
CALCIUM: 8.5 mg/dL — AB (ref 8.9–10.3)
CO2: 23 mmol/L (ref 22–32)
CREATININE: 0.55 mg/dL (ref 0.44–1.00)
Chloride: 107 mmol/L (ref 101–111)
GFR calc Af Amer: >60 mL/min (ref >60)
Glucose, Bld: 127 mg/dL — ABNORMAL HIGH (ref 65–99)
Potassium: 4.1 mmol/L (ref 3.5–5.1)
Sodium: 139 mmol/L (ref 135–145)
TOTAL PROTEIN: 5.9 g/dL — AB (ref 6.5–8.1)
Total Bilirubin: 0.9 mg/dL (ref 0.3–1.2)

## 2016-03-21 LAB — STREP PNEUMONIAE URINARY ANTIGEN: Strep Pneumo Urinary Antigen: NEGATIVE

## 2016-03-21 LAB — LACTIC ACID, PLASMA: Lactic Acid, Venous: 2.2 mmol/L (ref 0.5–1.9)

## 2016-03-21 MED ORDER — ENOXAPARIN SODIUM 30 MG/0.3ML ~~LOC~~ SOLN
30.0000 mg | SUBCUTANEOUS | Status: DC
  Administered 2016-03-21 – 2016-03-28 (×8): 30 mg via SUBCUTANEOUS
  Filled 2016-03-21 (×8): qty 0.3

## 2016-03-21 MED ORDER — LIP MEDEX EX OINT
1.0000 "application " | TOPICAL_OINTMENT | CUTANEOUS | Status: DC | PRN
  Filled 2016-03-21 (×2): qty 7

## 2016-03-21 MED ORDER — ORAL CARE MOUTH RINSE
15.0000 mL | Freq: Two times a day (BID) | OROMUCOSAL | Status: DC
  Administered 2016-03-21 – 2016-03-28 (×13): 15 mL via OROMUCOSAL

## 2016-03-21 MED ORDER — PANTOPRAZOLE SODIUM 40 MG IV SOLR
40.0000 mg | INTRAVENOUS | Status: DC
  Administered 2016-03-21 – 2016-03-28 (×8): 40 mg via INTRAVENOUS
  Filled 2016-03-21 (×8): qty 40

## 2016-03-21 MED ORDER — CHLORHEXIDINE GLUCONATE 0.12 % MT SOLN
15.0000 mL | Freq: Two times a day (BID) | OROMUCOSAL | Status: DC
  Administered 2016-03-21 – 2016-03-28 (×15): 15 mL via OROMUCOSAL
  Filled 2016-03-21 (×13): qty 15

## 2016-03-21 MED ORDER — SODIUM CHLORIDE 0.9 % IV SOLN
500.0000 mg | INTRAVENOUS | Status: DC
  Administered 2016-03-21 – 2016-03-23 (×3): 500 mg via INTRAVENOUS
  Filled 2016-03-21 (×4): qty 500

## 2016-03-21 MED ORDER — PIPERACILLIN-TAZOBACTAM 3.375 G IVPB
3.3750 g | Freq: Three times a day (TID) | INTRAVENOUS | Status: DC
  Administered 2016-03-21 – 2016-03-28 (×21): 3.375 g via INTRAVENOUS
  Filled 2016-03-21 (×18): qty 50

## 2016-03-21 MED ORDER — IPRATROPIUM-ALBUTEROL 0.5-2.5 (3) MG/3ML IN SOLN
3.0000 mL | Freq: Three times a day (TID) | RESPIRATORY_TRACT | Status: DC
  Administered 2016-03-21 – 2016-03-28 (×22): 3 mL via RESPIRATORY_TRACT
  Filled 2016-03-21 (×22): qty 3

## 2016-03-21 NOTE — Progress Notes (Signed)
Pt remains on BIPAP due to breathing 31 times a minute.

## 2016-03-21 NOTE — Progress Notes (Signed)
PT Cancellation Note  Patient Details Name: Isabella George MRN: [id number] DOB: 24-Nov-1932   Cancelled Treatment:    Reason Eval/Treat Not Completed: PT screened, no needs identified, will sign off Per MD notes, pt with medical history significant of progressive supranuclear palsy with contractures and currently on hospice, also bedbound.  Pt with poor prognosis.  Pt does not present with acute PT skilled needs.  PT to sign off.   Dawanna Grauberger,KATHrine E 03/21/2016, 8:47 AM Carmelia Bake, PT, DPT 03/21/2016 Pager: [phone]

## 2016-03-21 NOTE — Progress Notes (Signed)
[facility] MSW note: Pt is a current hospice patient at home. Pt has been a [facility] patient for 1 year without any hospitalizations until this event. Pt is a DNR. Son-Brian lives with pt and is the primary caregiver. Pt has 24 hour care with private and agency sitters. Pt was non responsive during visit. Pt appeared comfortable. No grimace on face. BIPAP has been taken off pt at this time. Private sitter-Sarah at pt's beside. Pt continues to have around the clock are while at the hospital with her normal sitters. Pt's urine was dark. MSW discussed case with Arby Barrette, Diplomatic Services operational officer and hired Actuary. MSW attempted call to son-Brian. No answer. MSW left voice mail for son to give update and inquire how he was doing.  Please call with questions or concerns.   Marilynne Halsted, MSW [phone]

## 2016-03-21 NOTE — Progress Notes (Signed)
Pharmacy Antibiotic Note  Isabella George is a 83 y.o. female currently under there care of home Hospice, presented to the ED on 03/20/16 with c/o SOB.  To start vancomycin and cefepime for suspected PNA on 1/11. Possible aspiration PNA, so to change cefepime to Zosyn on 1/12 and continue vanc  Plan: 1) Zosyn 3.375g IV q8 (extended interval infusion) for CrCl > 20 ml/min 2) Continue Vanc 500mg  IV q24 - will change to start this AM because 1g Vanc ordered last PM was never charted as given.   _______________________________  Temp (24hrs), Avg:98.9 F (37.2 C), Min:98.4 F (36.9 C), Max:99.5 F (37.5 C)   Recent Labs Lab 03/20/16 1403 03/20/16 1413 03/20/16 2137 03/21/16 0326  WBC 10.6*  --   --  29.6*  CREATININE 0.47  --   --  0.55  LATICACIDVEN  --  2.83* 3.5*  --     Estimated Creatinine Clearance: 36 mL/min (by C-G formula based on SCr of 0.55 mg/dL).    Allergies  Allergen Reactions  . Bee Venom Anaphylaxis  . Adhesive [Tape] Rash     Thank you for allowing pharmacy to be a part of this patient's care.   Adrian Saran, PharmD, BCPS Pager [phone] 03/21/2016 8:06 AM

## 2016-03-21 NOTE — Progress Notes (Signed)
Pt off BIPAP at this time for a short break. Pt placed on 10 LPM HFNC. RN at bedside.

## 2016-03-21 NOTE — Progress Notes (Signed)
OT Cancellation Note  Patient Details Name: Isabella George MRN: [id number] DOB: Jan 13, 1933   Cancelled Treatment:    Reason Eval/Treat Not Completed: Other (comment).  Pt screened for OT.  Per chart, she is bedbound, has contractures and is on hospice and bipap.  Kinneth Fujiwara 03/21/2016, 7:39 AM  Lesle Chris, OTR/L [phone] 03/21/2016

## 2016-03-21 NOTE — Consult Note (Signed)
Consultation Note Date: 03/21/2016   Patient Name: Isabella George  DOB: 10-Jul-1932  MRN: [id number]  Age / Sex: 83 y.o., female  PCP: Gayland Curry, DO Referring Physician: Eugenie Filler, MD  Reason for Consultation: Establishing goals of care in the setting of end stage PSP  HPI/Patient Profile: 83 y.o. female with past medical history of progressive supranuclear palsy who was admitted on 03/20/2016 with respiratory distress and pneumonia.  Her pneumonia is likely due to recurrent / chronic aspiration.  Currently Mrs. Conklin has a WBC count of 29.6 and is on vanc and zosyn.  She is requiring intermittent Bipap.  The Broberg family was seen in December 2016 by PMT - Wadie Lessen  Clinical Assessment and Goals of Care: Initially I met at bedside with one of her private care-givers.  I learned that Mrs. Pekar has an apartment at [employer] but lives in her son Brian's home.  She has 2 care givers with her at all times.  She is bed bound and completely dependent for all ADLs.  She speaks a word or two at a time, but does not speak often.  She suffers with excessive phlegm.   She has lived with her son for the past 5 years and they appear to have done an excellent job caring for her.  She has been receiving care from [facility] since her admission in 02/2015.  Then I spoke to Richardson on the phone for approximately 1 hour.  Aaron Edelman is a retired Forensic psychologist who is devoted to taking care of his mother.  There is also another son who does not live in town.  Aaron Edelman asked many questions about current treatments and diagnostics.  We then discussed the fact that her current infection was likely caused by recurrent chronic aspiration and that if we are fortunate enough to successfully treat this pneumonia there will be another pneumonia - its a matter of time.   Aaron Edelman understood.  He feels that if his mother regains enough strength to eat  pureed foods again he would like to take her home and resume his previous mode of operation.  However, if she does not regain enough strength to take oral nutrition, Aaron Edelman understands she will be at end of life.  He would want comfort feedings and Hospice.  Aaron Edelman wants his mother's death to be as peaceful as possible.  We discussed recurrent pneumonia, respiratory distress and planning for her death.   I strongly recommended Hospice House at end of life as giving him the highest probability of a peaceful passing.  I is strongly considering that option - - if it looks as though she is not going to be able to take oral nutrition.  I gave Aaron Edelman the PM Team number for any questions over the weekend and offered to follow up with him early next week to reassess his mother's status and determine next steps.  Primary Decision Maker:  HCPOA son Stephenia Vogan.    SUMMARY OF RECOMMENDATIONS    Continue current treatment.  Do  not escalate care.   DNR / DNI   No PEG tube. If the patient improves will go home with hospice services If she does not improve patient will likely go to [facility].  Additional Recommendations (Limitations, Scope, Preferences):  PMT to talk with Aaron Edelman again on Monday 1/15 to reassess  Palliative Prophylaxis:   Aspiration, Frequent Pain Assessment and Oral Care   Prognosis:   < 3 months.  Patient is at high risk of an acute event that may take her life at any time.  If she is unable to take PO nutrition her life span will be less than 2 weeks.  Discharge Planning: To Be Determined      Primary Diagnoses: Present on Admission: . Acute respiratory failure with hypoxia ([place]) . Aspiration pneumonia ([place]) . Acid reflux . Hypotension . Essential hypertension . Hyperlipidemia . PSP (progressive supranuclear palsy) ([place]) . DNR (do not resuscitate) . Sepsis ([place]) . HCAP (healthcare-associated pneumonia) . Leukocytosis   I have reviewed the medical  record, interviewed the patient and family, and examined the patient. The following aspects are pertinent.  Past Medical History:  Diagnosis Date  . Abnormality of gait   . Acid reflux   . Acute posthemorrhagic anemia 06/2011  . Allergic rhinitis due to pollen   . Anemia, unspecified   . Aneurysm of unspecified site 07/08/2011  . Cancer [facility])    Breast Cancer  . Chest pain, unspecified 02/10/2011  . Closed fracture of intertrochanteric section of femur ([place]) 07/07/2011  . Closed fracture of rib(s), unspecified 12/27/2010  . Closed fracture of two ribs 04/14/2011  . Contracture of finger joint 07/25/2013   Left 2,3,4 fingers  . Contusion of toe 05/26/2011  . Debility, unspecified 07/25/2011  . Depressive disorder, not elsewhere classified 2012  . Diaphragmatic hernia without mention of obstruction or gangrene 04/14/2011  . Disturbance of skin sensation 05/26/2011  . Dizziness and giddiness 06/23/2011  . Esophagitis, unspecified 04/14/2011  . Hip fracture ([place])   . Hyperlipidemia   . Loss of weight 04/14/2011  . Malignant neoplasm of breast (female), unspecified site 86   s/p left mastectomy  . Memory loss   . Osteoarthritis of both knees   . Osteoarthrosis, unspecified whether generalized or localized, unspecified site 12/17/2011  . Osteoporosis, senile   . Personal history of fall 06/23/2011  . Restless leg syndrome 08/04/2012  . Rhinitis, non-allergic   . Scoliosis (and kyphoscoliosis), idiopathic   . Sleep disturbance, unspecified   . Ulcer of esophagus 02/25/2011  . Unspecified essential hypertension   . Unspecified hereditary and idiopathic peripheral neuropathy 02/05/2011  . Unspecified urinary incontinence 12/27/2010  . Unspecified venous (peripheral) insufficiency 07/08/2011   Social History   Social History  . Marital status: Widowed    Spouse name: N/A  . Number of children: N/A  . Years of education: N/A   Social History Main Topics  . Smoking status: Never Smoker  .  Smokeless tobacco: Never Used  . Alcohol use 0.0 oz/week     Comment: rare  . Drug use: No  . Sexual activity: No   Other Topics Concern  . None   Social History Narrative   Lives off [address]   Patient is widowed.    Patient has 2 children.   Patient has 3 years or college.   Patient is retired.    Never smoked   Alcohol none   Exercise none         Family  History  Problem Relation Age of Onset  . Heart disease Mother   . Heart disease Brother   . Heart attack Brother   . Cancer Father     Liver cancer  . COPD Brother   . Deep vein thrombosis Brother   . Varicose Veins Brother   . Hyperlipidemia Son   . Diabetes Paternal Aunt    Scheduled Meds: . aspirin  81 mg Oral Daily  . chlorhexidine  15 mL Mouth Rinse BID  . enoxaparin (LOVENOX) injection  30 mg Subcutaneous Q24H  . ipratropium-albuterol  3 mL Nebulization TID  . mouth rinse  15 mL Mouth Rinse q12n4p  . pantoprazole (PROTONIX) IV  40 mg Intravenous Q24H  . piperacillin-tazobactam (ZOSYN)  IV  3.375 g Intravenous Q8H  . vancomycin  500 mg Intravenous Q24H  . vancomycin  1,000 mg Intravenous Once   Continuous Infusions: . sodium chloride 75 mL/hr at 03/21/16 1500   PRN Meds:.acetaminophen, albuterol, guaiFENesin-dextromethorphan, HYDROcodone-acetaminophen, ipratropium, lip balm, ondansetron (ZOFRAN) IV, sorbitol Allergies  Allergen Reactions  . Bee Venom Anaphylaxis  . Adhesive [Tape] Rash   Review of Systems on Bipap  Physical Exam  Frail, small elderly female on bipap.   Does not respond to my voice or touch. CV rrr Abd thin, firm. Extremities - stiff.  Trace edema.  Vital Signs: BP (!) 123/50   Pulse 67   Temp 97.4 F (36.3 C) (Axillary)   Resp 19   Ht 5' 8.9" (1.75 m)   Wt 41.3 kg (91 lb 0.8 oz)   SpO2 100%   BMI 13.49 kg/m  Pain Assessment: PAINAD       SpO2: SpO2: 100 % O2 Device:SpO2: 100 % O2 Flow Rate: .   IO: Intake/output summary:  Intake/Output Summary (Last  24 hours) at 03/21/16 1601 Last data filed at 03/21/16 1547  Gross per 24 hour  Intake          3912.92 ml  Output              400 ml  Net          3512.92 ml    LBM: Last BM Date: 03/21/16 Baseline Weight: Weight: 41.3 kg (91 lb 0.8 oz) Most recent weight: Weight: 41.3 kg (91 lb 0.8 oz)     Palliative Assessment/Data:   Flowsheet Rows   Flowsheet Row Most Recent Value  Intake Tab  Referral Department  Hospitalist  Unit at Time of Referral  ICU  Palliative Care Primary Diagnosis  Neurology  Date Notified  03/21/16  Palliative Care Type  Return patient Palliative Care  Reason for referral  Clarify Goals of Care, Counsel Regarding Hospice  Date of Admission  03/20/16  Date first seen by Palliative Care  03/21/16  # of days Palliative referral response time  0 Day(s)  # of days IP prior to Palliative referral  1  Clinical Assessment  Palliative Performance Scale Score  10%  Psychosocial & Spiritual Assessment  Palliative Care Outcomes      Time In: 3:30 Time Out: 4:50 pm Time Total: 80 min. Greater than 50%  of this time was spent counseling and coordinating care related to the above assessment and plan.  Signed by: Imogene Burn, PA-C Palliative Medicine Pager: [phone]  Please contact Palliative Medicine Team phone at [phone] for questions and concerns.  For individual provider: See Shea Evans

## 2016-03-21 NOTE — Progress Notes (Signed)
PROGRESS NOTE    Isabella George  [id number] DOB: [birth year] DOA: 03/20/2016 PCP: Hollace Kinnier, DO    Brief Narrative:  Patient is a 83 year old female with progressive supranuclear palsy with contractures was at home with hospice, debility and currently bedbound minimally verbal presented to the ED with acute respiratory distress placed on the BiPAP and being treated for healthcare associated pneumonia versus aspiration pneumonia and sepsis. Palliative care consultation pending.   Assessment & Plan:   Principal Problem:   Acute respiratory failure with hypoxia (HCC) Active Problems:   Sepsis ([place])   Aspiration pneumonia (HCC)   HCAP (healthcare-associated pneumonia)   Acid reflux   Essential hypertension   Hyperlipidemia   PSP (progressive supranuclear palsy) (HCC)   DNR (do not resuscitate)   Hypotension   Leukocytosis  #1 acute respiratory failure with hypoxia likely secondary to healthcare associated pneumonia versus aspiration pneumonia Patient had presented with acute respiratory distress with increased work of breathing and was satting in the 70s on a nonrebreather on arrival to the ED. Patient placed on the BiPAP with improvement with respiratory status. Patient resting comfortably on the BiPAP. Influenza PCR negative. RSV panel pending. Patient with a significant leukocytosis today with a white count of 29.6. Lactic acid level trending down currently at 2.2 from 3.5 from 2.83. Urine strep pneumococcus antigen negative. Afebrile. Continue IV vancomycin, scheduled nebulizers. Discontinue IV cefepime and placed on IV Zosyn. Trial of BiPAP later on today. Continue suctioning. Follow.  #2 sepsis Questionable etiology. May be secondary to healthcare associated pneumonia/aspiration pneumonia. Patient with a worsening white count of 29.6 today. Lactic acid level trending down. Patient afebrile. Tachycardia improving. Some improvement would respiratory rate. Blood cultures  pending. Influenza PCR negative. Urine cultures pending. Continue IV vancomycin. Due to worsening leukocytosis will discontinue IV cefepime and place on IV Zosyn. Follow.  #3 hypotension Improved with hydration. Decrease IV fluids to normal saline at 75 mL per hour.  #4 progressive supranuclear palsy Patient in contractures, and has been steady lead declining over the past year. Patient was on hospice at home. However son states patient was placed on hospice due to her bad urinary tract infection from last hospitalization.  #5 leukocytosis Questionable etiology. May be secondary to healthcare associated pneumonia versus aspiration pneumonia. Patient has been pancultured. Continue IV vancomycin. Discontinue IV cefepime and placed on IV Zosyn. Follow.  #6 gastroesophageal reflux disease PPI.  #7 prognosis Patient with a poor prognosis. Patient with progressive supranuclear palsy and currently with hospice at home presented with acute respiratory failure with hypoxia with concerns for aspiration pneumonia. Patient is currently a DO NOT RESUSCITATE. It has been explained to patient's son who is a healthcare power of attorney that it is uncertain how well patient will do during this hospitalization. Will try to treat a probable pneumonia, try to place on IV fluids for better blood pressure control and try to treat reversible things. If no significant improvement in the next 36-48 hours may need to consider transitioning to a full comfort measures. Palliative care consultation pending for goals of care and further management.     DVT prophylaxis: Lovenox Code Status: DO NOT RESUSCITATE Family Communication: Updated caregiver at bedside. Disposition Plan: Home once medically stable respiratory status back to baseline versus home with hospice versus residential hospice pending palliative care evaluation.   Consultants:   Palliative care pending  Procedures:   Chest x-ray 03/20/2016,  03/21/2016  Antimicrobials:   IV vancomycin 03/20/2016  IV cefepime 03/20/2016>>>> 03/21/2016  IV Zosyn 03/21/2016   Subjective: Patient resting comfortably on BiPAP.  Objective: Vitals:   03/21/16 0741 03/21/16 0742 03/21/16 0800 03/21/16 0900  BP:  122/64 (!) 120/58   Pulse:  82    Resp:  (!) 35 20 (!) 22  Temp:   97.4 F (36.3 C)   TempSrc:   Axillary   SpO2: 100% 100% 100%   Weight:      Height:   5' 8.9" (1.75 m)     Intake/Output Summary (Last 24 hours) at 03/21/16 0933 Last data filed at 03/21/16 0900  Gross per 24 hour  Intake             3300 ml  Output              190 ml  Net             3110 ml   Filed Weights   03/20/16 2124  Weight: 41.3 kg (91 lb 0.8 oz)    Examination:  General exam: Appears comfortable On bipap Respiratory system: Some scattered coarse breath sounds otherwise clear. No crackles. Respiratory effort normal. Cardiovascular system: S1 & S2 heard, RRR. No JVD, murmurs, rubs, gallops or clicks. No pedal edema. Gastrointestinal system: Abdomen is nondistended, soft and nontender. No organomegaly or masses felt. Normal bowel sounds heard. Central nervous system: Alert and oriented. No focal neurological deficits. Extremities: Extremities in contractures. Skin: No rashes, lesions or ulcers Psychiatry: Unable to assess.    Data Reviewed: I have personally reviewed following labs and imaging studies  CBC:  Recent Labs Lab 03/20/16 1403 03/21/16 0326  WBC 10.6* 29.6*  NEUTROABS 8.7* 27.5*  HGB 14.9 12.6  HCT 43.3 37.5  MCV 93.5 93.3  PLT 310 [id number]   Basic Metabolic Panel:  Recent Labs Lab 03/20/16 1403 03/20/16 2139 03/21/16 0326  NA 135  --  139  K 4.1  --  4.1  CL 98*  --  107  CO2 25  --  23  GLUCOSE 191*  --  127*  BUN 12  --  16  CREATININE 0.47  --  0.55  CALCIUM 8.7*  --  8.5*  MG  --  1.8  --    GFR: Estimated Creatinine Clearance: 36 mL/min (by C-G formula based on SCr of 0.55 mg/dL). Liver Function  Tests:  Recent Labs Lab 03/20/16 1403 03/21/16 0326  AST 32 32  ALT 29 30  ALKPHOS 77 68  BILITOT 0.8 0.9  PROT 6.2* 5.9*  ALBUMIN 3.5 3.3*   No results for input(s): LIPASE, AMYLASE in the last 168 hours. No results for input(s): AMMONIA in the last 168 hours. Coagulation Profile:  Recent Labs Lab 03/20/16 2137  INR 1.21   Cardiac Enzymes: No results for input(s): CKTOTAL, CKMB, CKMBINDEX, TROPONINI in the last 168 hours. BNP (last 3 results) No results for input(s): PROBNP in the last 8760 hours. HbA1C: No results for input(s): HGBA1C in the last 72 hours. CBG: No results for input(s): GLUCAP in the last 168 hours. Lipid Profile: No results for input(s): CHOL, HDL, LDLCALC, TRIG, CHOLHDL, LDLDIRECT in the last 72 hours. Thyroid Function Tests: No results for input(s): TSH, T4TOTAL, FREET4, T3FREE, THYROIDAB in the last 72 hours. Anemia Panel: No results for input(s): VITAMINB12, FOLATE, FERRITIN, TIBC, IRON, RETICCTPCT in the last 72 hours. Sepsis Labs:  Recent Labs Lab 03/20/16 1413 03/20/16 2137 03/21/16 0751  PROCALCITON  --  4.70  --   LATICACIDVEN 2.83* 3.5* 2.2*    Recent  Results (from the past 240 hour(s))  MRSA PCR Screening     Status: None   Collection Time: 03/20/16  6:00 PM  Result Value Ref Range Status   MRSA by PCR NEGATIVE NEGATIVE Final    Comment:        The GeneXpert MRSA Assay (FDA approved for NASAL specimens only), is one component of a comprehensive MRSA colonization surveillance program. It is not intended to diagnose MRSA infection nor to guide or monitor treatment for MRSA infections.          Radiology Studies: Dg Chest Port 1 View  Result Date: 03/21/2016 CLINICAL DATA:  Respiratory failure EXAM: PORTABLE CHEST 1 VIEW COMPARISON:  March 20, 2016 FINDINGS: A small portion of the left base is not visualized. In the visualized regions, there is persistent patchy opacity in the right lower lobe, stable. Lungs  elsewhere clear. Heart is mildly enlarged with pulmonary vascularity within normal limits. There is atherosclerotic calcification in the aorta. No pneumothorax. No adenopathy. There are surgical clips in the left axillary region. IMPRESSION: Note that a portion of the left base is not visualized. There is patchy infiltrate consistent with pneumonia in the right lower lobe, stable. No new opacity evident in regions visualized. Stable cardiac prominence. There is aortic atherosclerosis. Electronically Signed   By: Lowella Grip III M.D.   On: 03/21/2016 07:09   Dg Chest Port 1 View  Result Date: 03/20/2016 CLINICAL DATA:  Shortness of breath.  Unresponsive patient. EXAM: PORTABLE CHEST 1 VIEW COMPARISON:  03/08/2015 FINDINGS: The cardiac silhouette is mildly enlarged. Mediastinal contours appear intact. There is no evidence of focal airspace consolidation, pleural effusion or pneumothorax. There is left lower lobe atelectasis. Osseous structures are without acute abnormality. Postsurgical changes in the left chest wall. Soft tissues are otherwise grossly normal. IMPRESSION: Stably enlarged cardiac silhouette, mild. Left lower lobe atelectasis. Electronically Signed   By: Fidela Salisbury M.D.   On: 03/20/2016 14:09        Scheduled Meds: . aspirin  81 mg Oral Daily  . enoxaparin (LOVENOX) injection  30 mg Subcutaneous Q24H  . ipratropium-albuterol  3 mL Nebulization TID  . pantoprazole (PROTONIX) IV  40 mg Intravenous Q24H  . piperacillin-tazobactam (ZOSYN)  IV  3.375 g Intravenous Q8H  . vancomycin  500 mg Intravenous Q24H  . vancomycin  1,000 mg Intravenous Once   Continuous Infusions: . sodium chloride 100 mL/hr at 03/21/16 0900     LOS: 1 day    Time spent: [place], MD [facility] Pager [phone]  If 7PM-7AM, please contact night-coverage [email or web address] Password [facility] 03/21/2016, 9:33 AM

## 2016-03-21 NOTE — Progress Notes (Signed)
[facility] [place]-GIP RN Visit  This is a related, covered GIP admission from 03/20/16 to [facility] diagnosis of progressive supranuclear palsy, per Dr. Karie Georges.  Patient is a DNR.  Patient was evaluated by hospice RN in the home yesterday, where patient was noted to have vitals WNL and in no acute distress.  Approximately an hour after the nurses's departure, patient became pale and experienced labored breathing.  Patient's son activated EMS, due to the acute change in her respiratory status and to seek further evaluation.  Patient was admitted for acute respiratory failure likely secondary to an aspiration pneumonia and sepsis.  Visited patient in room, with caregiver at bedside.  Patient not responsive to verbal or tactile stimuli.  Per caregiver, patient is able to speak only minimal words at baseline.  Patient is currently on BiPap with FiO2 at 50%.  Her respiratory rate was in the 30's this morning, so the decision was made to hold off weaning oxygen until later today.  Patient receiving IV Zosyn and Vancomycin and scheduled nebulizer treatments. WBC 29.6 this morning and blood cultures pending.  Patient has F/C in place with minimal dark, yellow urine noted.  No family present at time of visit.  PMT consult pending to readdress goals of care.  Appreciate PMT assistance.  Updated medication list and transfer summary placed on chart.  Please call with any hospice-related questions or concerns.  Thank you, Freddi Starr RN, BSN [facility] Liaison [phone]  [facility] liaisons now live on [place].

## 2016-03-21 NOTE — Progress Notes (Signed)
Pt remains on BIPAP due to pt condition. 

## 2016-03-22 LAB — BASIC METABOLIC PANEL
ANION GAP: 7 (ref 5–15)
BUN: 21 mg/dL — ABNORMAL HIGH (ref 6–20)
CHLORIDE: 111 mmol/L (ref 101–111)
CO2: 22 mmol/L (ref 22–32)
Calcium: 8.4 mg/dL — ABNORMAL LOW (ref 8.9–10.3)
Creatinine, Ser: 0.4 mg/dL — ABNORMAL LOW (ref 0.44–1.00)
GFR calc Af Amer: >60 mL/min (ref >60)
GLUCOSE: 81 mg/dL (ref 65–99)
POTASSIUM: 3.5 mmol/L (ref 3.5–5.1)
SODIUM: 140 mmol/L (ref 135–145)

## 2016-03-22 LAB — URINE CULTURE: Culture: NO GROWTH

## 2016-03-22 LAB — BLOOD CULTURE ID PANEL (REFLEXED)
Acinetobacter baumannii: NOT DETECTED
CANDIDA GLABRATA: NOT DETECTED
CANDIDA KRUSEI: NOT DETECTED
CANDIDA TROPICALIS: NOT DETECTED
Candida albicans: NOT DETECTED
Candida parapsilosis: NOT DETECTED
ESCHERICHIA COLI: NOT DETECTED
Enterobacter cloacae complex: NOT DETECTED
Enterobacteriaceae species: NOT DETECTED
Enterococcus species: NOT DETECTED
Haemophilus influenzae: NOT DETECTED
KLEBSIELLA PNEUMONIAE: NOT DETECTED
Klebsiella oxytoca: NOT DETECTED
Listeria monocytogenes: NOT DETECTED
Neisseria meningitidis: NOT DETECTED
PROTEUS SPECIES: NOT DETECTED
PSEUDOMONAS AERUGINOSA: NOT DETECTED
STAPHYLOCOCCUS SPECIES: NOT DETECTED
Serratia marcescens: NOT DETECTED
Staphylococcus aureus (BCID): NOT DETECTED
Streptococcus agalactiae: NOT DETECTED
Streptococcus pneumoniae: NOT DETECTED
Streptococcus pyogenes: NOT DETECTED
Streptococcus species: NOT DETECTED

## 2016-03-22 LAB — CBC WITH DIFFERENTIAL/PLATELET
BASOS ABS: 0 10*3/uL (ref 0.0–0.1)
Basophils Relative: 0 %
EOS PCT: 0 %
Eosinophils Absolute: 0 10*3/uL (ref 0.0–0.7)
HCT: 32.9 % — ABNORMAL LOW (ref 36.0–46.0)
HEMOGLOBIN: 11 g/dL — AB (ref 12.0–15.0)
LYMPHS PCT: 6 %
Lymphs Abs: 1.1 10*3/uL (ref 0.7–4.0)
MCH: 30.9 pg (ref 26.0–34.0)
MCHC: 33.4 g/dL (ref 30.0–36.0)
MCV: 92.4 fL (ref 78.0–100.0)
Monocytes Absolute: 1.1 10*3/uL — ABNORMAL HIGH (ref 0.1–1.0)
Monocytes Relative: 5 %
NEUTROS PCT: 89 %
Neutro Abs: 18.4 10*3/uL — ABNORMAL HIGH (ref 1.7–7.7)
PLATELETS: 210 10*3/uL (ref 150–400)
RBC: 3.56 MIL/uL — ABNORMAL LOW (ref 3.87–5.11)
RDW: 13.5 % (ref 11.5–15.5)
WBC: 20.6 10*3/uL — ABNORMAL HIGH (ref 4.0–10.5)

## 2016-03-22 LAB — LACTIC ACID, PLASMA: LACTIC ACID, VENOUS: 0.9 mmol/L (ref 0.5–1.9)

## 2016-03-22 MED ORDER — POTASSIUM CHLORIDE IN NACL 40-0.9 MEQ/L-% IV SOLN
INTRAVENOUS | Status: DC
  Administered 2016-03-22 – 2016-03-23 (×2): 75 mL/h via INTRAVENOUS
  Filled 2016-03-22 (×4): qty 1000

## 2016-03-22 MED ORDER — ACETAMINOPHEN 650 MG RE SUPP: 325.0000 mg | RECTAL | Status: DC | PRN

## 2016-03-22 MED ORDER — MORPHINE SULFATE (PF) 2 MG/ML IV SOLN: 0.5000 mg | INTRAVENOUS | Status: DC | PRN

## 2016-03-22 MED ORDER — SODIUM CHLORIDE 0.9 % IV SOLN: INTRAVENOUS | Status: DC

## 2016-03-22 NOTE — Progress Notes (Signed)
PROGRESS NOTE    Isabella George  HXT:[id number] DOB: 26-Sep-1932 DOA: 03/20/2016 PCP: Hollace Kinnier, DO    Brief Narrative:  Patient is a 83 year old female with progressive supranuclear palsy with contractures was at home with hospice, debility and currently bedbound minimally verbal presented to the ED with acute respiratory distress placed on the BiPAP and being treated for healthcare associated pneumonia versus aspiration pneumonia and sepsis. Palliative care consultation pending.   Assessment & Plan:   Principal Problem:   Acute respiratory failure with hypoxia (HCC) Active Problems:   Sepsis ([place])   Aspiration pneumonia (HCC)   HCAP (healthcare-associated pneumonia)   Acid reflux   Essential hypertension   Hyperlipidemia   PSP (progressive supranuclear palsy) (HCC)   DNR (do not resuscitate)   Hypotension   Leukocytosis  #1 acute respiratory failure with hypoxia likely secondary to healthcare associated pneumonia versus aspiration pneumonia Patient had presented with acute respiratory distress with increased work of breathing and was satting in the 70s on a nonrebreather on arrival to the ED. Patient placed off the BiPAP since yesterday afternoon, with improvement with respiratory status. Influenza PCR negative. RSV panel negative. Patient with a significant leukocytosis of 29.6 on 03/21/2016 which has slowly been trending back down and currently at 20.6 today with broad antibiotic coverage. Lactic acid level trending down currently at 0.9 from 2.2 from 3.5 from 2.83. Urine strep pneumococcus antigen negative. Afebrile. Continue IV vancomycin, IV Zosyn, scheduled nebulizers. Continue suctioning. Follow.  #2 sepsis Questionable etiology. Likely secondary to healthcare associated pneumonia/aspiration pneumonia. Patient with a worsening white count of 29.6 yesterday 03/21/2016. WBC trending down with broader antibiotic coverage and currently at 20.6.  Lactic acid level trending  down. Patient afebrile. Tachycardia improving. Some improvement with respiratory rate. Blood cultures pending. Influenza PCR negative. Urine cultures negative. Continue IV vancomycin and IV Zosyn.  #3 hypotension Improved with hydration. Continue IV fluids at 75 mL per hour.  #4 progressive supranuclear palsy Patient in contractures, and has been steady lead declining over the past year. Patient was on hospice at home. However son states patient was placed on hospice due to her bad urinary tract infection from last hospitalization.  #5 leukocytosis Questionable etiology. May be secondary to healthcare associated pneumonia versus aspiration pneumonia. Patient has been pancultured. Leukocytosis trending back down. Continue IV vancomycin. Discontinued IV cefepime and placed on IV Zosyn. Follow.  #6 gastroesophageal reflux disease PPI.  #7 prognosis Patient with a poor prognosis. Patient with progressive supranuclear palsy and currently with hospice at home presented with acute respiratory failure with hypoxia with concerns for aspiration pneumonia. Patient is currently a DO NOT RESUSCITATE. It has been explained to patient's son who is a healthcare power of attorney that it is uncertain how well patient will do during this hospitalization. Will try to treat a probable pneumonia, try to place on IV fluids for better blood pressure control and try to treat reversible things. If no significant improvement in the next few days, may need to consider transitioning to a full comfort measures. Palliative care has assessed the patient and met with the family for goals of care.      DVT prophylaxis: Lovenox Code Status: DO NOT RESUSCITATE Family Communication: Updated son and caregiver at bedside. Disposition Plan: Home with hospice once medically stable respiratory status back to baseline and tolerating oral intake versus versus residential hospice if no significant improvement and poor oral intake.     Consultants:   Palliative care: Dr Rowe Pavy 03/21/2016  Procedures:  Chest x-ray 03/20/2016, 03/21/2016  Antimicrobials:   IV vancomycin 03/20/2016  IV cefepime 03/20/2016>>>> 03/21/2016  IV Zosyn 03/21/2016   Subjective: Patient resting comfortably on High Flow oxygen 4 L. Patient nonverbal.  Objective: Vitals:   03/22/16 0400 03/22/16 0500 03/22/16 0600 03/22/16 0800  BP: (!) 125/42 (!) 108/31 (!) 125/51   Pulse: 83 (!) 56 62   Resp: _0 Temp: 98.1 F (36.7 C)   97.4 F (36.3 C)  TempSrc: Axillary   Axillary  SpO2: 98% 100% 99%   Weight:      Height:        Intake/Output Summary (Last 24 hours) at 03/22/16 0945 Last data filed at 03/22/16 0600  Gross per 24 hour  Intake          1837.92 ml  Output              460 ml  Net          1377.92 ml   Filed Weights   03/20/16 2124  Weight: 41.3 kg (91 lb 0.8 oz)    Examination:  General exam: Appears comfortable Off bipap. On HF oxygen at 4 L Respiratory system: Some scattered coarse breath sounds on right, otherwise clear. No crackles. Respiratory effort normal. Cardiovascular system: S1 & S2 heard, RRR. No JVD, murmurs, rubs, gallops or clicks. No pedal edema. Gastrointestinal system: Abdomen is nondistended, soft and nontender. No organomegaly or masses felt. Normal bowel sounds heard. Central nervous system: Alert and oriented. No focal neurological deficits. Extremities: Extremities in contractures. Skin: No rashes, lesions or ulcers Psychiatry: Unable to assess.    Data Reviewed: I have personally reviewed following labs and imaging studies  CBC:  Recent Labs Lab 03/20/16 1403 03/21/16 0326 03/22/16 0352  WBC 10.6* 29.6* 20.6*  NEUTROABS 8.7* 27.5* 18.4*  HGB 14.9 12.6 11.0*  HCT 43.3 37.5 32.9*  MCV 93.5 93.3 92.4  PLT 310 256 161   Basic Metabolic Panel:  Recent Labs Lab 03/20/16 1403 03/20/16 2139 03/21/16 0326 03/22/16 0352  NA 135  --  139 140  K 4.1  --  4.1 3.5   CL 98*  --  107 111  CO2 25  --  23 22  GLUCOSE 191*  --  127* 81  BUN 12  --  16 21*  CREATININE 0.47  --  0.55 0.40*  CALCIUM 8.7*  --  8.5* 8.4*  MG  --  1.8  --   --    GFR: Estimated Creatinine Clearance: 36 mL/min (by C-G formula based on SCr of 0.4 mg/dL (L)). Liver Function Tests:  Recent Labs Lab 03/20/16 1403 03/21/16 0326  AST 32 32  ALT 29 30  ALKPHOS 77 68  BILITOT 0.8 0.9  PROT 6.2* 5.9*  ALBUMIN 3.5 3.3*   No results for input(s): LIPASE, AMYLASE in the last 168 hours. No results for input(s): AMMONIA in the last 168 hours. Coagulation Profile:  Recent Labs Lab 03/20/16 2137  INR 1.21   Cardiac Enzymes: No results for input(s): CKTOTAL, CKMB, CKMBINDEX, TROPONINI in the last 168 hours. BNP (last 3 results) No results for input(s): PROBNP in the last 8760 hours. HbA1C: No results for input(s): HGBA1C in the last 72 hours. CBG: No results for input(s): GLUCAP in the last 168 hours. Lipid Profile: No results for input(s): CHOL, HDL, LDLCALC, TRIG, CHOLHDL, LDLDIRECT in the last 72 hours. Thyroid Function Tests: No results for input(s): TSH, T4TOTAL, FREET4, T3FREE, THYROIDAB in the last 72  hours. Anemia Panel: No results for input(s): VITAMINB12, FOLATE, FERRITIN, TIBC, IRON, RETICCTPCT in the last 72 hours. Sepsis Labs:  Recent Labs Lab 03/20/16 1413 03/20/16 2137 03/21/16 0751 03/22/16 0805  PROCALCITON  --  4.70  --   --   LATICACIDVEN 2.83* 3.5* 2.2* 0.9    Recent Results (from the past 240 hour(s))  Respiratory Panel by PCR     Status: None   Collection Time: 03/20/16  6:00 PM  Result Value Ref Range Status   Adenovirus NOT DETECTED NOT DETECTED Final   Coronavirus 229E NOT DETECTED NOT DETECTED Final   Coronavirus HKU1 NOT DETECTED NOT DETECTED Final   Coronavirus NL63 NOT DETECTED NOT DETECTED Final   Coronavirus OC43 NOT DETECTED NOT DETECTED Final   Metapneumovirus NOT DETECTED NOT DETECTED Final   Rhinovirus / Enterovirus NOT  DETECTED NOT DETECTED Final   Influenza A NOT DETECTED NOT DETECTED Final   Influenza B NOT DETECTED NOT DETECTED Final   Parainfluenza Virus 1 NOT DETECTED NOT DETECTED Final   Parainfluenza Virus 2 NOT DETECTED NOT DETECTED Final   Parainfluenza Virus 3 NOT DETECTED NOT DETECTED Final   Parainfluenza Virus 4 NOT DETECTED NOT DETECTED Final   Respiratory Syncytial Virus NOT DETECTED NOT DETECTED Final   Bordetella pertussis NOT DETECTED NOT DETECTED Final   Chlamydophila pneumoniae NOT DETECTED NOT DETECTED Final   Mycoplasma pneumoniae NOT DETECTED NOT DETECTED Final    Comment: Performed at [facility]  MRSA PCR Screening     Status: None   Collection Time: 03/20/16  6:00 PM  Result Value Ref Range Status   MRSA by PCR NEGATIVE NEGATIVE Final    Comment:        The GeneXpert MRSA Assay (FDA approved for NASAL specimens only), is one component of a comprehensive MRSA colonization surveillance program. It is not intended to diagnose MRSA infection nor to guide or monitor treatment for MRSA infections.   Culture, Urine     Status: None   Collection Time: 03/20/16  9:40 PM  Result Value Ref Range Status   Specimen Description URINE, CATHETERIZED  Final   Special Requests NONE  Final   Culture NO GROWTH Performed at [facility]   Final   Report Status 03/22/2016 FINAL  Final         Radiology Studies: Dg Chest Port 1 View  Result Date: 03/21/2016 CLINICAL DATA:  Respiratory failure EXAM: PORTABLE CHEST 1 VIEW COMPARISON:  March 20, 2016 FINDINGS: A small portion of the left base is not visualized. In the visualized regions, there is persistent patchy opacity in the right lower lobe, stable. Lungs elsewhere clear. Heart is mildly enlarged with pulmonary vascularity within normal limits. There is atherosclerotic calcification in the aorta. No pneumothorax. No adenopathy. There are surgical clips in the left axillary region. IMPRESSION: Note that a portion of  the left base is not visualized. There is patchy infiltrate consistent with pneumonia in the right lower lobe, stable. No new opacity evident in regions visualized. Stable cardiac prominence. There is aortic atherosclerosis. Electronically Signed   By: Lowella Grip III M.D.   On: 03/21/2016 07:09   Dg Chest Port 1 View  Result Date: 03/20/2016 CLINICAL DATA:  Shortness of breath.  Unresponsive patient. EXAM: PORTABLE CHEST 1 VIEW COMPARISON:  03/08/2015 FINDINGS: The cardiac silhouette is mildly enlarged. Mediastinal contours appear intact. There is no evidence of focal airspace consolidation, pleural effusion or pneumothorax. There is left lower lobe atelectasis. Osseous structures are  without acute abnormality. Postsurgical changes in the left chest wall. Soft tissues are otherwise grossly normal. IMPRESSION: Stably enlarged cardiac silhouette, mild. Left lower lobe atelectasis. Electronically Signed   By: Fidela Salisbury M.D.   On: 03/20/2016 14:09        Scheduled Meds: . aspirin  81 mg Oral Daily  . chlorhexidine  15 mL Mouth Rinse BID  . enoxaparin (LOVENOX) injection  30 mg Subcutaneous Q24H  . ipratropium-albuterol  3 mL Nebulization TID  . mouth rinse  15 mL Mouth Rinse q12n4p  . pantoprazole (PROTONIX) IV  40 mg Intravenous Q24H  . piperacillin-tazobactam (ZOSYN)  IV  3.375 g Intravenous Q8H  . vancomycin  500 mg Intravenous Q24H  . vancomycin  1,000 mg Intravenous Once   Continuous Infusions: . 0.9 % NaCl with KCl 40 mEq / L       LOS: 2 days    Time spent: [place], MD [facility] Pager [phone]  If 7PM-7AM, please contact night-coverage [email or web address] Password [facility] 03/22/2016, 9:45 AM

## 2016-03-22 NOTE — Progress Notes (Signed)
SLP Cancellation Note  Patient Details Name: Isabella George MRN: [id number] DOB: [birth year]   Cancelled treatment:       Reason Eval/Treat Not Completed: Patient not medically ready due to minimal alertness. MD suggested waiting until Monday to reattempt.    Kern Reap, [place], CCC-SLP 03/22/2016, 10:01 AM [phone]

## 2016-03-22 NOTE — Progress Notes (Signed)
PHARMACY - PHYSICIAN COMMUNICATION CRITICAL VALUE ALERT - BLOOD CULTURE IDENTIFICATION (BCID)  Results for orders placed or performed during the hospital encounter of 03/20/16  Blood Culture ID Panel (Reflexed) (Collected: 03/20/2016  2:03 PM)  Result Value Ref Range   Enterococcus species NOT DETECTED NOT DETECTED   Listeria monocytogenes NOT DETECTED NOT DETECTED   Staphylococcus species NOT DETECTED NOT DETECTED   Staphylococcus aureus NOT DETECTED NOT DETECTED   Streptococcus species NOT DETECTED NOT DETECTED   Streptococcus agalactiae NOT DETECTED NOT DETECTED   Streptococcus pneumoniae NOT DETECTED NOT DETECTED   Streptococcus pyogenes NOT DETECTED NOT DETECTED   Acinetobacter baumannii NOT DETECTED NOT DETECTED   Enterobacteriaceae species NOT DETECTED NOT DETECTED   Enterobacter cloacae complex NOT DETECTED NOT DETECTED   Escherichia coli NOT DETECTED NOT DETECTED   Klebsiella oxytoca NOT DETECTED NOT DETECTED   Klebsiella pneumoniae NOT DETECTED NOT DETECTED   Proteus species NOT DETECTED NOT DETECTED   Serratia marcescens NOT DETECTED NOT DETECTED   Haemophilus influenzae NOT DETECTED NOT DETECTED   Neisseria meningitidis NOT DETECTED NOT DETECTED   Pseudomonas aeruginosa NOT DETECTED NOT DETECTED   Candida albicans NOT DETECTED NOT DETECTED   Candida glabrata NOT DETECTED NOT DETECTED   Candida krusei NOT DETECTED NOT DETECTED   Candida parapsilosis NOT DETECTED NOT DETECTED   Candida tropicalis NOT DETECTED NOT DETECTED    Name of physician (or Provider) Contacted: [place]  Changes to prescribed antibiotics required: none- wait for more cx detail   Biagio Borg 03/22/2016  1:07 PM

## 2016-03-22 NOTE — Progress Notes (Signed)
[facility] ([facility]) - W/E MSW GIP note:  This is a related and covered admission from 03/20/16 to [facility] diagnosis of progressive supranuclear palsy per Dr. Karie Georges. Code Status is DNR. As previously documented, patient was evaluated by hospice RN in home 03/20/16 and patient was noted to have vitals WNL and in no acute distress. Approximately an hour after RN's departure from home, patient became pale with labored breathing. Patient's son activated EMS for further evaluation of acute changes in respiratory status. Patient was admitted for acute respiratory failure likely secondary to aspiration PNA and sepsis.   Visited patient in room. Also present patient's son Aaron Edelman and devoted caregiver of three years Mardene Celeste. Patient was not responsive to verbal or tactile stimuli during this visit. Aaron Edelman tells me patient is off Bipap and has been weaned from 10L to 4L [place]. He tells me SLP to eval patient today and he is hopeful she will be able to get back to previous level of function of a week and a half ago. Note SLP saw later in the day was not able to eval with plan to return Monday. Note patient continues on nebs and ABX.   Aaron Edelman expressed much appreciation for Palliative Medicine meeting with [facility]. He is aware [facility] RN will follow up tomorrow and continue to support and assist with planning.   Thank you,  Erling Conte, LCSW [phone]  [facility] hospital liaisons are listed on [place] under [facility] and [facility].

## 2016-03-23 DIAGNOSIS — T68XXXA Hypothermia, initial encounter: Secondary | ICD-10-CM

## 2016-03-23 DIAGNOSIS — L8990 Pressure ulcer of unspecified site, unspecified stage: Secondary | ICD-10-CM | POA: Insufficient documentation

## 2016-03-23 LAB — BASIC METABOLIC PANEL
Anion gap: 8 (ref 5–15)
BUN: 29 mg/dL — ABNORMAL HIGH (ref 6–20)
CALCIUM: 8.3 mg/dL — AB (ref 8.9–10.3)
CO2: 21 mmol/L — ABNORMAL LOW (ref 22–32)
CREATININE: 0.36 mg/dL — AB (ref 0.44–1.00)
Chloride: 111 mmol/L (ref 101–111)
Glucose, Bld: 65 mg/dL (ref 65–99)
Potassium: 3.5 mmol/L (ref 3.5–5.1)
SODIUM: 140 mmol/L (ref 135–145)

## 2016-03-23 LAB — CBC
HCT: 35.2 % — ABNORMAL LOW (ref 36.0–46.0)
Hemoglobin: 12.3 g/dL (ref 12.0–15.0)
MCH: 32.8 pg (ref 26.0–34.0)
MCHC: 34.9 g/dL (ref 30.0–36.0)
MCV: 93.9 fL (ref 78.0–100.0)
PLATELETS: 212 10*3/uL (ref 150–400)
RBC: 3.75 MIL/uL — ABNORMAL LOW (ref 3.87–5.11)
RDW: 13.8 % (ref 11.5–15.5)
WBC: 15.5 10*3/uL — ABNORMAL HIGH (ref 4.0–10.5)

## 2016-03-23 NOTE — Progress Notes (Signed)
PROGRESS NOTE    Isabella George  WOE:[id number] DOB: 30-Mar-1932 DOA: 03/20/2016 PCP: Hollace Kinnier, DO    Brief Narrative:  Patient is a 83 year old female with progressive supranuclear palsy with contractures was at home with hospice, debility and currently bedbound minimally verbal presented to the ED with acute respiratory distress placed on the BiPAP and being treated for healthcare associated pneumonia versus aspiration pneumonia and sepsis. Palliative care consultation pending.   Assessment & Plan:   Principal Problem:   Acute respiratory failure with hypoxia (HCC) Active Problems:   Sepsis ([place])   Aspiration pneumonia (HCC)   HCAP (healthcare-associated pneumonia)   Acid reflux   Essential hypertension   Hyperlipidemia   PSP (progressive supranuclear palsy) (HCC)   DNR (do not resuscitate)   Hypotension   Leukocytosis   Pressure injury of skin   Hypothermia  #1 acute respiratory failure with hypoxia likely secondary to healthcare associated pneumonia versus aspiration pneumonia Patient had presented with acute respiratory distress with increased work of breathing and was satting in the 70s on a nonrebreather on arrival to the ED. Patient placed off the BiPAP since yesterday afternoon, with improvement with respiratory status. Influenza PCR negative. RSV panel negative. Patient with a significant leukocytosis of 29.6 on 03/21/2016 which has slowly been trending back down and currently at 15.5 today with broad antibiotic coverage. Lactic acid level trending down currently at 0.9 from 2.2 from 3.5 from 2.83. Urine strep pneumococcus antigen negative. Afebrile. Continue IV vancomycin, IV Zosyn, scheduled nebulizers. Continue suctioning. Follow.  #2 sepsis Questionable etiology. Likely secondary to healthcare associated pneumonia/aspiration pneumonia. Patient with a worsening white count of 29.6( 03/21/2016). WBC trending down with broader antibiotic coverage and currently at  15.5.  Lactic acid level trending down. Patient afebrile and was hypothermic earlier on. Tachycardia improving. Some improvement with respiratory rate. Blood cultures with 1/2 gram-positive cocci in chains. Influenza PCR negative. RSV panel negative. Urine cultures negative. Continue IV vancomycin and IV Zosyn.  #3 hypotension Improved with hydration. Continue IV fluids at 75 mL per hour.  #4 progressive supranuclear palsy Patient in contractures, and has been steady lead declining over the past year. Patient was on hospice at home. However son states patient was placed on hospice due to her bad urinary tract infection from last hospitalization.  #5 leukocytosis Questionable etiology. May be secondary to healthcare associated pneumonia versus aspiration pneumonia. Patient has been pancultured. Leukocytosis trending back down. One of 2 blood cultures with gram-positive cocci in chains. Continue IV vancomycin and IV Zosyn.  #6 gastroesophageal reflux disease PPI.  #7 transient hypothermia Likely secondary to acute illness of pneumonia. Blood cultures pending with 1/2 preliminary readings of gram-positive cocci in chains. Continue empiric IV vancomycin and IV Zosyn. Follow.  #8 prognosis Patient with a poor prognosis. Patient with progressive supranuclear palsy and currently with hospice at home presented with acute respiratory failure with hypoxia with concerns for aspiration pneumonia. Patient is currently a DO NOT RESUSCITATE. It has been explained to patient's son who is a healthcare power of attorney that it is uncertain how well patient will do during this hospitalization. Will try to treat a probable pneumonia, try to place on IV fluids for better blood pressure control and try to treat reversible things. If no significant improvement in the next few days, may need to consider transitioning to a full comfort measures. Palliative care has assessed the patient and met with the family for goals of  care.      DVT prophylaxis:  Lovenox Code Status: DO NOT RESUSCITATE Family Communication: Updated son and caregivers at bedside. Disposition Plan: Home with hospice once medically stable respiratory status back to baseline and tolerating oral intake versus versus residential hospice if no significant improvement and poor oral intake.    Consultants:   Palliative care: Dr Rowe Pavy 03/21/2016  Procedures:   Chest x-ray 03/20/2016, 03/21/2016  Antimicrobials:   IV vancomycin 03/20/2016  IV cefepime 03/20/2016>>>> 03/21/2016  IV Zosyn 03/21/2016   Subjective: Patient resting comfortably on [place]. Patient noted to be hypothermic earlier on this morning. Patient nonverbal. Patient tracking.  Objective: Vitals:   03/23/16 0700 03/23/16 0800 03/23/16 0900 03/23/16 0932  BP: (!) 138/41 (!) 124/49 (!) 133/50   Pulse: (!) 47 (!) 50 93   Resp: 15 17 (!) 23   Temp:    98 F (36.7 C)  TempSrc:    Rectal  SpO2: 100% 100% 100%   Weight:      Height:        Intake/Output Summary (Last 24 hours) at 03/23/16 0956 Last data filed at 03/23/16 0500  Gross per 24 hour  Intake          1738.75 ml  Output              530 ml  Net          1208.75 ml   Filed Weights   03/20/16 2124  Weight: 41.3 kg (91 lb 0.8 oz)    Examination:  General exam: Appears comfortable Off bipap. On [place] oxygen at 4 L. Tracking. Respiratory system: Some scattered coarse breath sounds on right, otherwise clear. No crackles. Respiratory effort normal. Cardiovascular system: S1 & S2 heard, RRR. No JVD, murmurs, rubs, gallops or clicks. No pedal edema. Gastrointestinal system: Abdomen is nondistended, soft and nontender. No organomegaly or masses felt. Normal bowel sounds heard. Central nervous system: Alert and oriented. No focal neurological deficits. Extremities: Extremities in contractures. Skin: No rashes, lesions or ulcers Psychiatry: Unable to assess.    Data Reviewed: I have personally reviewed  following labs and imaging studies  CBC:  Recent Labs Lab 03/20/16 1403 03/21/16 0326 03/22/16 0352 03/23/16 0312  WBC 10.6* 29.6* 20.6* 15.5*  NEUTROABS 8.7* 27.5* 18.4*  --   HGB 14.9 12.6 11.0* 12.3  HCT 43.3 37.5 32.9* 35.2*  MCV 93.5 93.3 92.4 93.9  PLT 310 256 210 709   Basic Metabolic Panel:  Recent Labs Lab 03/20/16 1403 03/20/16 2139 03/21/16 0326 03/22/16 0352 03/23/16 0312  NA 135  --  139 140 140  K 4.1  --  4.1 3.5 3.5  CL 98*  --  107 111 111  CO2 25  --  23 22 21*  GLUCOSE 191*  --  127* 81 65  BUN 12  --  16 21* 29*  CREATININE 0.47  --  0.55 0.40* 0.36*  CALCIUM 8.7*  --  8.5* 8.4* 8.3*  MG  --  1.8  --   --   --    GFR: Estimated Creatinine Clearance: 36 mL/min (by C-G formula based on SCr of 0.36 mg/dL (L)). Liver Function Tests:  Recent Labs Lab 03/20/16 1403 03/21/16 0326  AST 32 32  ALT 29 30  ALKPHOS 77 68  BILITOT 0.8 0.9  PROT 6.2* 5.9*  ALBUMIN 3.5 3.3*   No results for input(s): LIPASE, AMYLASE in the last 168 hours. No results for input(s): AMMONIA in the last 168 hours. Coagulation Profile:  Recent Labs Lab 03/20/16 2137  INR 1.21   Cardiac Enzymes: No results for input(s): CKTOTAL, CKMB, CKMBINDEX, TROPONINI in the last 168 hours. BNP (last 3 results) No results for input(s): PROBNP in the last 8760 hours. HbA1C: No results for input(s): HGBA1C in the last 72 hours. CBG: No results for input(s): GLUCAP in the last 168 hours. Lipid Profile: No results for input(s): CHOL, HDL, LDLCALC, TRIG, CHOLHDL, LDLDIRECT in the last 72 hours. Thyroid Function Tests: No results for input(s): TSH, T4TOTAL, FREET4, T3FREE, THYROIDAB in the last 72 hours. Anemia Panel: No results for input(s): VITAMINB12, FOLATE, FERRITIN, TIBC, IRON, RETICCTPCT in the last 72 hours. Sepsis Labs:  Recent Labs Lab 03/20/16 1413 03/20/16 2137 03/21/16 0751 03/22/16 0805  PROCALCITON  --  4.70  --   --   LATICACIDVEN 2.83* 3.5* 2.2* 0.9     Recent Results (from the past 240 hour(s))  Culture, blood (Routine X 2) w Reflex to ID Panel     Status: None (Preliminary result)   Collection Time: 03/20/16  2:03 PM  Result Value Ref Range Status   Specimen Description BLOOD RIGHT ANTECUBITAL  Final   Special Requests BOTTLES DRAWN AEROBIC AND ANAEROBIC 5CC  Final   Culture  Setup Time   Final    GRAM POSITIVE COCCI IN CHAINS ANAEROBIC BOTTLE ONLY CRITICAL RESULT CALLED TO, READ BACK BY AND VERIFIED WITH: M LILLISTON,PHARMD AT 1302 03/22/16 BY L BENFIELD    Culture   Final    GRAM POSITIVE COCCI CULTURE REINCUBATED FOR BETTER GROWTH Performed at [facility]    Report Status PENDING  Incomplete  Blood Culture ID Panel (Reflexed)     Status: None   Collection Time: 03/20/16  2:03 PM  Result Value Ref Range Status   Enterococcus species NOT DETECTED NOT DETECTED Final   Listeria monocytogenes NOT DETECTED NOT DETECTED Final   Staphylococcus species NOT DETECTED NOT DETECTED Final   Staphylococcus aureus NOT DETECTED NOT DETECTED Final   Streptococcus species NOT DETECTED NOT DETECTED Final   Streptococcus agalactiae NOT DETECTED NOT DETECTED Final   Streptococcus pneumoniae NOT DETECTED NOT DETECTED Final   Streptococcus pyogenes NOT DETECTED NOT DETECTED Final   Acinetobacter baumannii NOT DETECTED NOT DETECTED Final   Enterobacteriaceae species NOT DETECTED NOT DETECTED Final   Enterobacter cloacae complex NOT DETECTED NOT DETECTED Final   Escherichia coli NOT DETECTED NOT DETECTED Final   Klebsiella oxytoca NOT DETECTED NOT DETECTED Final   Klebsiella pneumoniae NOT DETECTED NOT DETECTED Final   Proteus species NOT DETECTED NOT DETECTED Final   Serratia marcescens NOT DETECTED NOT DETECTED Final   Haemophilus influenzae NOT DETECTED NOT DETECTED Final   Neisseria meningitidis NOT DETECTED NOT DETECTED Final   Pseudomonas aeruginosa NOT DETECTED NOT DETECTED Final   Candida albicans NOT DETECTED NOT DETECTED  Final   Candida glabrata NOT DETECTED NOT DETECTED Final   Candida krusei NOT DETECTED NOT DETECTED Final   Candida parapsilosis NOT DETECTED NOT DETECTED Final   Candida tropicalis NOT DETECTED NOT DETECTED Final  Culture, blood (Routine X 2) w Reflex to ID Panel     Status: None (Preliminary result)   Collection Time: 03/20/16  2:24 PM  Result Value Ref Range Status   Specimen Description BLOOD LEFT HAND  Final   Special Requests IN PEDIATRIC BOTTLE 1CC  Final   Culture   Final    NO GROWTH 2 DAYS Performed at [facility]    Report Status PENDING  Incomplete  Respiratory Panel by PCR  Status: None   Collection Time: 03/20/16  6:00 PM  Result Value Ref Range Status   Adenovirus NOT DETECTED NOT DETECTED Final   Coronavirus 229E NOT DETECTED NOT DETECTED Final   Coronavirus HKU1 NOT DETECTED NOT DETECTED Final   Coronavirus NL63 NOT DETECTED NOT DETECTED Final   Coronavirus OC43 NOT DETECTED NOT DETECTED Final   Metapneumovirus NOT DETECTED NOT DETECTED Final   Rhinovirus / Enterovirus NOT DETECTED NOT DETECTED Final   Influenza A NOT DETECTED NOT DETECTED Final   Influenza B NOT DETECTED NOT DETECTED Final   Parainfluenza Virus 1 NOT DETECTED NOT DETECTED Final   Parainfluenza Virus 2 NOT DETECTED NOT DETECTED Final   Parainfluenza Virus 3 NOT DETECTED NOT DETECTED Final   Parainfluenza Virus 4 NOT DETECTED NOT DETECTED Final   Respiratory Syncytial Virus NOT DETECTED NOT DETECTED Final   Bordetella pertussis NOT DETECTED NOT DETECTED Final   Chlamydophila pneumoniae NOT DETECTED NOT DETECTED Final   Mycoplasma pneumoniae NOT DETECTED NOT DETECTED Final    Comment: Performed at [facility]  MRSA PCR Screening     Status: None   Collection Time: 03/20/16  6:00 PM  Result Value Ref Range Status   MRSA by PCR NEGATIVE NEGATIVE Final    Comment:        The GeneXpert MRSA Assay (FDA approved for NASAL specimens only), is one component of a comprehensive MRSA  colonization surveillance program. It is not intended to diagnose MRSA infection nor to guide or monitor treatment for MRSA infections.   Culture, Urine     Status: None   Collection Time: 03/20/16  9:40 PM  Result Value Ref Range Status   Specimen Description URINE, CATHETERIZED  Final   Special Requests NONE  Final   Culture NO GROWTH Performed at [facility]   Final   Report Status 03/22/2016 FINAL  Final         Radiology Studies: No results found.      Scheduled Meds: . chlorhexidine  15 mL Mouth Rinse BID  . enoxaparin (LOVENOX) injection  30 mg Subcutaneous Q24H  . ipratropium-albuterol  3 mL Nebulization TID  . mouth rinse  15 mL Mouth Rinse q12n4p  . pantoprazole (PROTONIX) IV  40 mg Intravenous Q24H  . piperacillin-tazobactam (ZOSYN)  IV  3.375 g Intravenous Q8H  . vancomycin  500 mg Intravenous Q24H  . vancomycin  1,000 mg Intravenous Once   Continuous Infusions: . 0.9 % NaCl with KCl 40 mEq / L Stopped (03/23/16 0451)     LOS: 3 days    Time spent: [place], MD [facility] Pager [phone]  If 7PM-7AM, please contact night-coverage [email or web address] Password [facility] 03/23/2016, 9:56 AM

## 2016-03-23 NOTE — Care Management (Signed)
[facility] RN Visit   This is a related and covered admission per MD Karie Georges.  Code status is DNR.  Patient came to the hospital after experiencing labored breathing at home. Her son called for EMS.  She was admitted for acute respiratory failure likely secondary to aspiration Pneumonia and sepsis.  I have entered the room today to visit with her; her son Aaron Edelman is at her bedside.  He speaks for an extended period of time and asks RN if there is a need for [facility] placement, would that be an option??  I tell him yes, and we would be more than happy to discuss this option with him.  He is grateful.  Patient was seen by MD Grandville Silos on today.  MD Grandville Silos 03/23/16 notes:  Improvement of respiratory status, continue IV Vanco, IV Zosyn, and scheduled nebs.  IV fluids to continue.  END OF NOTATIONS  Please call with any hospice related questions or concerns  Thank you Claire Shown. Mingo Amber RN [facility] On Call RN [phone]

## 2016-03-24 LAB — CBC WITH DIFFERENTIAL/PLATELET
Basophils Absolute: 0 10*3/uL (ref 0.0–0.1)
Basophils Relative: 0 %
EOS PCT: 0 %
Eosinophils Absolute: 0 10*3/uL (ref 0.0–0.7)
HEMATOCRIT: 34.4 % — AB (ref 36.0–46.0)
HEMOGLOBIN: 11.8 g/dL — AB (ref 12.0–15.0)
Lymphocytes Relative: 11 %
Lymphs Abs: 1 10*3/uL (ref 0.7–4.0)
MCH: 32.3 pg (ref 26.0–34.0)
MCHC: 34.3 g/dL (ref 30.0–36.0)
MCV: 94.2 fL (ref 78.0–100.0)
MONOS PCT: 5 %
Monocytes Absolute: 0.5 10*3/uL (ref 0.1–1.0)
NEUTROS PCT: 84 %
Neutro Abs: 7.5 10*3/uL (ref 1.7–7.7)
Platelets: 170 10*3/uL (ref 150–400)
RBC: 3.65 MIL/uL — AB (ref 3.87–5.11)
RDW: 13.9 % (ref 11.5–15.5)
WBC: 9 10*3/uL (ref 4.0–10.5)

## 2016-03-24 LAB — CULTURE, BLOOD (ROUTINE X 2)

## 2016-03-24 LAB — GLUCOSE, CAPILLARY
GLUCOSE-CAPILLARY: 120 mg/dL — AB (ref 65–99)
Glucose-Capillary: 57 mg/dL — ABNORMAL LOW (ref 65–99)

## 2016-03-24 LAB — BASIC METABOLIC PANEL
ANION GAP: 9 (ref 5–15)
BUN: 27 mg/dL — ABNORMAL HIGH (ref 6–20)
CHLORIDE: 113 mmol/L — AB (ref 101–111)
CO2: 21 mmol/L — AB (ref 22–32)
Calcium: 8 mg/dL — ABNORMAL LOW (ref 8.9–10.3)
Creatinine, Ser: 0.44 mg/dL (ref 0.44–1.00)
GFR calc non Af Amer: >60 mL/min (ref >60)
Glucose, Bld: 57 mg/dL — ABNORMAL LOW (ref 65–99)
Potassium: 3.9 mmol/L (ref 3.5–5.1)
Sodium: 143 mmol/L (ref 135–145)

## 2016-03-24 LAB — TSH: TSH: 0.795 u[IU]/mL (ref 0.350–4.500)

## 2016-03-24 MED ORDER — DEXTROSE-NACL 5-0.45 % IV SOLN
INTRAVENOUS | Status: DC
  Administered 2016-03-24 (×2): via INTRAVENOUS

## 2016-03-24 MED ORDER — SODIUM CHLORIDE 0.45 % IV SOLN: INTRAVENOUS | Status: DC

## 2016-03-24 MED ORDER — DEXTROSE 50 % IV SOLN
INTRAVENOUS | Status: AC
  Filled 2016-03-24: qty 50

## 2016-03-24 MED ORDER — DEXTROSE 50 % IV SOLN
25.0000 mL | Freq: Once | INTRAVENOUS | Status: AC
  Administered 2016-03-24: 25 mL via INTRAVENOUS

## 2016-03-24 NOTE — Progress Notes (Signed)
Pharmacy Antibiotic Note  Isabella George is a 83 y.o. female admitted on 03/20/2016 with sepsis likely secondary to HCAP vs aspiration pneumonia.  Pharmacy has been consulted for Zosyn dosing. Vancomycin discontinued today after 3 days of treatment.    Today is day #5 of total antibiotics.   Plan: Zosyn 3.375g IV q8h (4 hour infusion).  Height: 5' 8.9" (175 cm) Weight: 91 lb 0.8 oz (41.3 kg) IBW/kg (Calculated) : 65.97  Temp (24hrs), Avg:97.9 F (36.6 C), Min:96.9 F (36.1 C), Max:98.6 F (37 C)   Recent Labs Lab 03/20/16 1403 03/20/16 1413 03/20/16 2137 03/21/16 0326 03/21/16 0751 03/22/16 0352 03/22/16 0805 03/23/16 0312 03/24/16 0325  WBC 10.6*  --   --  29.6*  --  20.6*  --  15.5* 9.0  CREATININE 0.47  --   --  0.55  --  0.40*  --  0.36* 0.44  LATICACIDVEN  --  2.83* 3.5*  --  2.2*  --  0.9  --   --     Estimated Creatinine Clearance: 36 mL/min (by C-G formula based on SCr of 0.44 mg/dL).    Allergies  Allergen Reactions  . Bee Venom Anaphylaxis  . Adhesive [Tape] Rash    Antimicrobials this admission:  1/12 Vanc >> 1/15 1/11 Cefepime >> 1/12 1/12 Zosyn >>  Dose adjustments this admission:   Microbiology results:  1/11 BCx x2: +GPC chains 1/2, BCID no detection 1/11 UCx: ngf 1/11 Sputum: sent  1/11 Influenza panel: negative 1/11 MRSA PCR: negative 1/11 Strep UA: negative  Thank you for allowing pharmacy to be a part of this patient's care.  Hershal Coria 03/24/2016 8:12 AM

## 2016-03-24 NOTE — Progress Notes (Signed)
Hypoglycemic Event  CBG:57 @ 0535  Treatment: D50 IV 25 mL  Symptoms: None  Follow-up CBG: Time:0608 CBG Result:120  Possible Reasons for Event: Inadequate meal intake  Comments/MD notified: Hugel meyer A. DO/ no new order received   Miette Molenda, Daralene Milch

## 2016-03-24 NOTE — Evaluation (Signed)
Clinical/Bedside Swallow Evaluation Patient Details  Name: Isabella George MRN: [id number] Date of Birth: [birth year]  Today's Date: 03/24/2016 Time: SLP Start Time (ACUTE ONLY): 1120 SLP Stop Time (ACUTE ONLY): 1148 SLP Time Calculation (min) (ACUTE ONLY): 28 min  Past Medical History:  Past Medical History:  Diagnosis Date  . Abnormality of gait   . Acid reflux   . Acute posthemorrhagic anemia 06/2011  . Allergic rhinitis due to pollen   . Anemia, unspecified   . Aneurysm of unspecified site 07/08/2011  . Cancer [facility])    Breast Cancer  . Chest pain, unspecified 02/10/2011  . Closed fracture of intertrochanteric section of femur ([place]) 07/07/2011  . Closed fracture of rib(s), unspecified 12/27/2010  . Closed fracture of two ribs 04/14/2011  . Contracture of finger joint 07/25/2013   Left 2,3,4 fingers  . Contusion of toe 05/26/2011  . Debility, unspecified 07/25/2011  . Depressive disorder, not elsewhere classified 2012  . Diaphragmatic hernia without mention of obstruction or gangrene 04/14/2011  . Disturbance of skin sensation 05/26/2011  . Dizziness and giddiness 06/23/2011  . Esophagitis, unspecified 04/14/2011  . Hip fracture ([place])   . Hyperlipidemia   . Loss of weight 04/14/2011  . Malignant neoplasm of breast (female), unspecified site 69   s/p left mastectomy  . Memory loss   . Osteoarthritis of both knees   . Osteoarthrosis, unspecified whether generalized or localized, unspecified site 12/17/2011  . Osteoporosis, senile   . Personal history of fall 06/23/2011  . Restless leg syndrome 08/04/2012  . Rhinitis, non-allergic   . Scoliosis (and kyphoscoliosis), idiopathic   . Sleep disturbance, unspecified   . Ulcer of esophagus 02/25/2011  . Unspecified essential hypertension   . Unspecified hereditary and idiopathic peripheral neuropathy 02/05/2011  . Unspecified urinary incontinence 12/27/2010  . Unspecified venous (peripheral) insufficiency 07/08/2011   Past Surgical History:   Past Surgical History:  Procedure Laterality Date  . Anal fissuer    . ANAL FISSURE REPAIR    . APPENDECTOMY    . BREAST SURGERY  1986   Left mastectomy  . CATARACT EXTRACTION W/ INTRAOCULAR LENS IMPLANT  2000   right  . COLONOSCOPY W/ BIOPSIES  02/21/2011   polyp-hyperplastic polyp, no adenomatous change or malignancy Dr. Cristina Gong  . ESOPHAGOGASTRODUODENOSCOPY ENDOSCOPY  [id number]   Dr. Cristina Gong with biopsy gastroesophageal junction mucosa with focal ulceration no intestinal metaplasia, dysphasia, or malignancy. Does have a hiatal hernia with esophagitis  . FEMUR IM NAIL  07/03/2011   Procedure: INTRAMEDULLARY (IM) NAIL FEMORAL;  Surgeon: Melina Schools, MD;  Location: [facility];  Service: Orthopedics;  Laterality: Left;  Synthes troch nail, jackson bed, c-arm  . HAND SURGERY    . HIP FRACTURE SURGERY    . ORIF ACETABULAR FRACTURE  07/03/2011   left hip Dr. Rolena Infante  . TONGUE SURGERY    . TONGUE SURGERY  2000   lump on back of tongue Dr. Owens Shark  . TONSILLECTOMY    . VARICOSE VEIN SURGERY     Ligation of vein   HPI:  83 year old female admitted 03/20/16 due to acute respiratory failure. PMH significant for PSP, GERD, esophagitis, breast CA. CXR reveals patchy RLL infiltrate c/w PNA.    Assessment / Plan / Recommendation Clinical Impression  Upon arrival in pt room, caregiver was talking on the phone with pt's son Aaron Edelman, who asked to speak with SLP. Lengthy conversation followed, with Aaron Edelman stating pt was unable to be positioned properly in the bed for safe po  intake, and that he was bringing in her wheelchair when he comes today. He verbalizes awareness of pt's high aspiration risk, but maintains pt is tolerating current diet (puree/thin liquid) at home with caregivers who are very familiar with her, and are very patient with her. Aaron Edelman indicated PEG tube is NOT an option. Pt with long-standing history of dysphagia, with MBS indicating silent aspiration of thin liquids (10/2014). BSE completed  02/2015 recommended NPO status based on presentation at bedside. RN indicates pt tolerating nasal cannula at this time, but is holding secretions orally, and requires suction to manage them. Oral care was completed with suction by SLP. Standing secretions noted in oral cavity, which SLP removed with suction. Pt was noted to bite down on yankauer, resistant to oral care despite encouragement by SLP and caregiver. At this time, NPO status is recommended for several reasons: long-standing history of dysphagia with SILENT aspiration, current RLL PNA, poor positioning, lethargy, and poor secretion management. ST will continue to follow for education and recommendations for least restrictive diet in keeping with pt/family wishes (cont'd po intake, no PEG tube). RN aware.    Aspiration Risk  Severe aspiration risk;Risk for inadequate nutrition/hydration    Diet Recommendation NPO   Medication Administration: Via alternative means    Other  Recommendations Oral Care Recommendations: Oral care QID Other Recommendations: Have oral suction available   Follow up Recommendations 24 hour supervision/assistance      Frequency and Duration min 1 x/week  2 weeks       Prognosis Prognosis for Safe Diet Advancement: Guarded Barriers to Reach Goals: Time post onset;Severity of deficits      Swallow Study   General Date of Onset: 03/20/16 HPI: 83 year old female admitted 03/20/16 due to acute respiratory failure. PMH significant for PSP, GERD, esophagitis, breast CA. CXR reveals patchy RLL infiltrate c/w PNA.  Type of Study: Bedside Swallow Evaluation Previous Swallow Assessment: MBS 10/2014 - silent aspiration thin, BSE 02/2015 - NPO recommended. Diet Prior to this Study: NPO Temperature Spikes Noted: No Respiratory Status: Nasal cannula History of Recent Intubation: No Behavior/Cognition: Confused;Uncooperative;Doesn't follow directions;Requires cueing;Lethargic/Drowsy Oral Cavity Assessment: Excessive  secretions Oral Care Completed by SLP: Yes Oral Cavity - Dentition: Adequate natural dentition Vision: Impaired for self-feeding Self-Feeding Abilities: Total assist Patient Positioning: Partially reclined Baseline Vocal Quality: Aphonic Volitional Cough: Cognitively unable to elicit Volitional Swallow: Unable to elicit    Oral/Motor/Sensory Function Overall Oral Motor/Sensory Function:  (unable to assess at this time)   Ice Chips Ice chips: Not tested   Thin Liquid Thin Liquid: Not tested    Nectar Thick Nectar Thick Liquid: Not tested   Honey Thick Honey Thick Liquid: Not tested   Puree Puree: Not tested   Solid   GO   Solid: Not tested       Ainara Eldridge B. Quentin Ore [facility], [place] [phone]  Shonna Chock 03/24/2016,12:13 PM

## 2016-03-24 NOTE — Progress Notes (Addendum)
It was found that patient's catheter was leaking just after shift change.26ml of saline was added to the balloons without much difference in the outcome- was still leaking. The foley was removed and replaced with another 14 French catheter. The balloon was inflated with 20 mls of saline because it leaked urine when inflated with 10 mls. Patient tolerated procedure ok.

## 2016-03-24 NOTE — Progress Notes (Signed)
[facility] [place]-GIP RN Visit  This is a related, covered GIP admission from 03/20/16 to [facility] diagnosis of progressive supranuclear palsy, per Dr. Karie Georges.  Patient is a DNR.  Patient was evaluated by hospice RN in the home yesterday, where patient was noted to have vitals WNL and in no acute distress.  Approximately an hour after the nurses's departure, patient became pale and experienced labored breathing.  Patient's son activated EMS, due to the acute change in her respiratory status and to seek further evaluation.  Patient was admitted for acute respiratory failure likely secondary to an aspiration pneumonia and sepsis.  Visited patient in room, with caregiver at bedside.  Patient not responsive to verbal or tactile stimuli.  Patient is now on 2 L [place] with O2 sats and respirations WNL.  Swallow study is currently pending.  RN reported that patient is holding secretions orally, and requiring frequent suctioning. Patient continues to receive IV Zosyn and IVF via a PIV.  Patient has not required any PRN comfort medications in the past 24 hours, per chart review.  No family present at time of visit.  Caregiver verbalized that family is awaiting swallow study, prior to further discussing discharge options. Hospice will continue to follow daily.  Please call with any hospice-related questions or concerns.  Thank you, Freddi Starr RN, BSN [facility] Liaison [phone]  [facility] liaisons now live on [place].

## 2016-03-24 NOTE — Progress Notes (Addendum)
Care givers in the room around the clock, they have worked with this pt for a while and are very familiar with her and her son who is POA. Pt opens her eyes,doesn't communicate verbally. Pt repositioned q 2 hr by care givers, Foam dressings on buttocks, sacrum and  back of thighs for protection. Son at bedside with care givers. O2 on @ 2L via nasal canula. Mouth care done,swabbed and suctioned and moisturizer applied to lips. Pt used to live at [facility], she now lives at home with caregivers and Hospice follows her.

## 2016-03-24 NOTE — Progress Notes (Signed)
PROGRESS NOTE    Isabella George  PPI:[id number] DOB: January 15, 1933 DOA: 03/20/2016 PCP: Hollace Kinnier, DO    Brief Narrative:  Patient is a 83 year old female with progressive supranuclear palsy with contractures was at home with hospice, debility and currently bedbound minimally verbal presented to the ED with acute respiratory distress placed on the BiPAP and being treated for healthcare associated pneumonia versus aspiration pneumonia and sepsis. Palliative care consultation pending.   Assessment & Plan:   Principal Problem:   Acute respiratory failure with hypoxia (HCC) Active Problems:   Sepsis ([place])   Aspiration pneumonia (HCC)   HCAP (healthcare-associated pneumonia)   Acid reflux   Essential hypertension   Hyperlipidemia   PSP (progressive supranuclear palsy) (HCC)   DNR (do not resuscitate)   Hypotension   Leukocytosis   Pressure injury of skin   Hypothermia  #1 acute respiratory failure with hypoxia likely secondary to healthcare associated pneumonia versus aspiration pneumonia Patient had presented with acute respiratory distress with increased work of breathing and was satting in the 70s on a nonrebreather on arrival to the ED. Patient placed off the BiPAP since yesterday afternoon, with improvement with respiratory status. Influenza PCR negative. RSV panel negative. Patient with a significant leukocytosis of 29.6 on 03/21/2016 which has slowly been trending back down and currently at 9.0 today with broad antibiotic coverage. Lactic acid level trending down currently at 0.9 from 2.2 from 3.5 from 2.83. Urine strep pneumococcus antigen negative. Afebrile. Discontinue IV vancomycin. Continue IV Zosyn, scheduled nebulizers. Continue suctioning. Follow.  #2 sepsis Questionable etiology. Likely secondary to healthcare associated pneumonia/aspiration pneumonia. Patient initially with a worsening white count of 29.6( 03/21/2016). WBC trending down with broader antibiotic coverage  and currently at 9.0.  Lactic acid level trending down. Patient afebrile and was hypothermic earlier on. Tachycardia improved. Some improvement with respiratory rate. Blood cultures with 1/2 gram-positive cocci in chains. Influenza PCR negative. RSV panel negative. Urine cultures negative. Discontinue IV vancomycin. Continue IV Zosyn.  #3 hypotension Improved with hydration. Change IVF to D51/2NS at 75 mL per hour, secondary to hypoglycemic episode this morning.  #4 progressive supranuclear palsy Patient in contractures, and has been steady lead declining over the past year. Patient was on hospice at home. However son states patient was placed on hospice due to her bad urinary tract infection from last hospitalization.  #5 leukocytosis Questionable etiology. May be secondary to healthcare associated pneumonia versus aspiration pneumonia. Patient has been pancultured. Leukocytosis trending back down. One of 2 blood cultures with gram-positive cocci in chains. Continue IV Zosyn.D/C IV Vancomycin.  #6 gastroesophageal reflux disease PPI.  #7 transient hypothermia Likely secondary to acute illness of pneumonia. Blood cultures pending with 1/2 preliminary readings of gram-positive cocci in chains. Check TSH. Continue empiric IV Zosyn. D/C IV Vancomycin. Follow.  #8 severe protein calorie malnutrition Secondary to poor oral intake. Patient now more alert today compared to early on this hospitalization. Speech therapy to reassess swallow function today for diet recommendations.  #9 prognosis Patient with a poor prognosis. Patient with progressive supranuclear palsy and currently with hospice at home presented with acute respiratory failure with hypoxia with concerns for aspiration pneumonia. Patient is currently a DO NOT RESUSCITATE. It has been explained to patient's son who is a healthcare power of attorney that it is uncertain how well patient will do during this hospitalization. Will try to treat a  probable pneumonia, try to place on IV fluids for better blood pressure control and try to treat reversible  things. If no significant improvement in the next few days, may need to consider transitioning to a full comfort measures. Palliative care has assessed the patient and met with the family for goals of care.      DVT prophylaxis: Lovenox Code Status: DO NOT RESUSCITATE Family Communication: Updated caregivers at bedside. Disposition Plan: Transfer to [facility]. Home with hospice once medically stable respiratory status back to baseline and tolerating oral intake versus versus residential hospice if no significant improvement and poor oral intake.    Consultants:   Palliative care: Dr Rowe Pavy 03/21/2016  Procedures:   Chest x-ray 03/20/2016, 03/21/2016  Antimicrobials:   IV vancomycin 03/20/2016>>>>>03/24/2016  IV cefepime 03/20/2016>>>> 03/21/2016  IV Zosyn 03/21/2016   Subjective: Patient is alert with eyes open and tracking. Patient noted to be hypothermic earlier on this morning which has since improved. Patient nonverbal. Patient tracking.  Objective: Vitals:   03/24/16 0100 03/24/16 0400 03/24/16 0800 03/24/16 0903  BP: (!) 140/59 (!) 115/42    Pulse: (!) 57 (!) 56    Resp: 20 19    Temp:  (!) 96.9 F (36.1 C) 98.6 F (37 C)   TempSrc:  Rectal Axillary   SpO2: 100% 99%  100%  Weight:      Height:        Intake/Output Summary (Last 24 hours) at 03/24/16 0917 Last data filed at 03/24/16 0700  Gross per 24 hour  Intake           1462.5 ml  Output              380 ml  Net           1082.5 ml   Filed Weights   03/20/16 2124  Weight: 41.3 kg (91 lb 0.8 oz)    Examination:  General exam: Appears comfortable Off bipap. On [place] oxygen at 4 L. Alert. Eyes open. Respiratory system: Some scattered coarse breath sounds on right, otherwise clear. No crackles. Respiratory effort normal. Cardiovascular system: S1 & S2 heard, RRR. No JVD, murmurs, rubs, gallops or  clicks. No pedal edema. Gastrointestinal system: Abdomen is nondistended, soft and nontender. No organomegaly or masses felt. Normal bowel sounds heard. Central nervous system: Alert. No focal neurological deficits. Extremities: Extremities in contractures. Skin: No rashes, lesions or ulcers Psychiatry: Unable to assess.    Data Reviewed: I have personally reviewed following labs and imaging studies  CBC:  Recent Labs Lab 03/20/16 1403 03/21/16 0326 03/22/16 0352 03/23/16 0312 03/24/16 0325  WBC 10.6* 29.6* 20.6* 15.5* 9.0  NEUTROABS 8.7* 27.5* 18.4*  --  7.5  HGB 14.9 12.6 11.0* 12.3 11.8*  HCT 43.3 37.5 32.9* 35.2* 34.4*  MCV 93.5 93.3 92.4 93.9 94.2  PLT 310 256 210 212 798   Basic Metabolic Panel:  Recent Labs Lab 03/20/16 1403 03/20/16 2139 03/21/16 0326 03/22/16 0352 03/23/16 0312 03/24/16 0325  NA 135  --  139 140 140 143  K 4.1  --  4.1 3.5 3.5 3.9  CL 98*  --  107 111 111 113*  CO2 25  --  23 22 21* 21*  GLUCOSE 191*  --  127* 81 65 57*  BUN 12  --  16 21* 29* 27*  CREATININE 0.47  --  0.55 0.40* 0.36* 0.44  CALCIUM 8.7*  --  8.5* 8.4* 8.3* 8.0*  MG  --  1.8  --   --   --   --    GFR: Estimated Creatinine Clearance: 36 mL/min (by C-G  formula based on SCr of 0.44 mg/dL). Liver Function Tests:  Recent Labs Lab 03/20/16 1403 03/21/16 0326  AST 32 32  ALT 29 30  ALKPHOS 77 68  BILITOT 0.8 0.9  PROT 6.2* 5.9*  ALBUMIN 3.5 3.3*   No results for input(s): LIPASE, AMYLASE in the last 168 hours. No results for input(s): AMMONIA in the last 168 hours. Coagulation Profile:  Recent Labs Lab 03/20/16 2137  INR 1.21   Cardiac Enzymes: No results for input(s): CKTOTAL, CKMB, CKMBINDEX, TROPONINI in the last 168 hours. BNP (last 3 results) No results for input(s): PROBNP in the last 8760 hours. HbA1C: No results for input(s): HGBA1C in the last 72 hours. CBG:  Recent Labs Lab 03/24/16 0535 03/24/16 0608  GLUCAP 57* 120*   Lipid  Profile: No results for input(s): CHOL, HDL, LDLCALC, TRIG, CHOLHDL, LDLDIRECT in the last 72 hours. Thyroid Function Tests: No results for input(s): TSH, T4TOTAL, FREET4, T3FREE, THYROIDAB in the last 72 hours. Anemia Panel: No results for input(s): VITAMINB12, FOLATE, FERRITIN, TIBC, IRON, RETICCTPCT in the last 72 hours. Sepsis Labs:  Recent Labs Lab 03/20/16 1413 03/20/16 2137 03/21/16 0751 03/22/16 0805  PROCALCITON  --  4.70  --   --   LATICACIDVEN 2.83* 3.5* 2.2* 0.9    Recent Results (from the past 240 hour(s))  Culture, blood (Routine X 2) w Reflex to ID Panel     Status: None (Preliminary result)   Collection Time: 03/20/16  2:03 PM  Result Value Ref Range Status   Specimen Description BLOOD RIGHT ANTECUBITAL  Final   Special Requests BOTTLES DRAWN AEROBIC AND ANAEROBIC 5CC  Final   Culture  Setup Time   Final    GRAM POSITIVE COCCI IN CHAINS ANAEROBIC BOTTLE ONLY CRITICAL RESULT CALLED TO, READ BACK BY AND VERIFIED WITH: M LILLISTON,PHARMD AT 1302 03/22/16 BY L BENFIELD    Culture   Final    GRAM POSITIVE COCCI IDENTIFICATION TO FOLLOW Performed at [facility]    Report Status PENDING  Incomplete  Blood Culture ID Panel (Reflexed)     Status: None   Collection Time: 03/20/16  2:03 PM  Result Value Ref Range Status   Enterococcus species NOT DETECTED NOT DETECTED Final   Listeria monocytogenes NOT DETECTED NOT DETECTED Final   Staphylococcus species NOT DETECTED NOT DETECTED Final   Staphylococcus aureus NOT DETECTED NOT DETECTED Final   Streptococcus species NOT DETECTED NOT DETECTED Final   Streptococcus agalactiae NOT DETECTED NOT DETECTED Final   Streptococcus pneumoniae NOT DETECTED NOT DETECTED Final   Streptococcus pyogenes NOT DETECTED NOT DETECTED Final   Acinetobacter baumannii NOT DETECTED NOT DETECTED Final   Enterobacteriaceae species NOT DETECTED NOT DETECTED Final   Enterobacter cloacae complex NOT DETECTED NOT DETECTED Final    Escherichia coli NOT DETECTED NOT DETECTED Final   Klebsiella oxytoca NOT DETECTED NOT DETECTED Final   Klebsiella pneumoniae NOT DETECTED NOT DETECTED Final   Proteus species NOT DETECTED NOT DETECTED Final   Serratia marcescens NOT DETECTED NOT DETECTED Final   Haemophilus influenzae NOT DETECTED NOT DETECTED Final   Neisseria meningitidis NOT DETECTED NOT DETECTED Final   Pseudomonas aeruginosa NOT DETECTED NOT DETECTED Final   Candida albicans NOT DETECTED NOT DETECTED Final   Candida glabrata NOT DETECTED NOT DETECTED Final   Candida krusei NOT DETECTED NOT DETECTED Final   Candida parapsilosis NOT DETECTED NOT DETECTED Final   Candida tropicalis NOT DETECTED NOT DETECTED Final  Culture, blood (Routine X 2) w Reflex  to ID Panel     Status: None (Preliminary result)   Collection Time: 03/20/16  2:24 PM  Result Value Ref Range Status   Specimen Description BLOOD LEFT HAND  Final   Special Requests IN PEDIATRIC BOTTLE [place]  Final   Culture   Final    NO GROWTH 3 DAYS Performed at [facility]    Report Status PENDING  Incomplete  Respiratory Panel by PCR     Status: None   Collection Time: 03/20/16  6:00 PM  Result Value Ref Range Status   Adenovirus NOT DETECTED NOT DETECTED Final   Coronavirus 229E NOT DETECTED NOT DETECTED Final   Coronavirus HKU1 NOT DETECTED NOT DETECTED Final   Coronavirus NL63 NOT DETECTED NOT DETECTED Final   Coronavirus OC43 NOT DETECTED NOT DETECTED Final   Metapneumovirus NOT DETECTED NOT DETECTED Final   Rhinovirus / Enterovirus NOT DETECTED NOT DETECTED Final   Influenza A NOT DETECTED NOT DETECTED Final   Influenza B NOT DETECTED NOT DETECTED Final   Parainfluenza Virus 1 NOT DETECTED NOT DETECTED Final   Parainfluenza Virus 2 NOT DETECTED NOT DETECTED Final   Parainfluenza Virus 3 NOT DETECTED NOT DETECTED Final   Parainfluenza Virus 4 NOT DETECTED NOT DETECTED Final   Respiratory Syncytial Virus NOT DETECTED NOT DETECTED Final    Bordetella pertussis NOT DETECTED NOT DETECTED Final   Chlamydophila pneumoniae NOT DETECTED NOT DETECTED Final   Mycoplasma pneumoniae NOT DETECTED NOT DETECTED Final    Comment: Performed at [facility]  MRSA PCR Screening     Status: None   Collection Time: 03/20/16  6:00 PM  Result Value Ref Range Status   MRSA by PCR NEGATIVE NEGATIVE Final    Comment:        The GeneXpert MRSA Assay (FDA approved for NASAL specimens only), is one component of a comprehensive MRSA colonization surveillance program. It is not intended to diagnose MRSA infection nor to guide or monitor treatment for MRSA infections.   Culture, Urine     Status: None   Collection Time: 03/20/16  9:40 PM  Result Value Ref Range Status   Specimen Description URINE, CATHETERIZED  Final   Special Requests NONE  Final   Culture NO GROWTH Performed at [facility]   Final   Report Status 03/22/2016 FINAL  Final         Radiology Studies: No results found.      Scheduled Meds: . chlorhexidine  15 mL Mouth Rinse BID  . enoxaparin (LOVENOX) injection  30 mg Subcutaneous Q24H  . ipratropium-albuterol  3 mL Nebulization TID  . mouth rinse  15 mL Mouth Rinse q12n4p  . pantoprazole (PROTONIX) IV  40 mg Intravenous Q24H  . piperacillin-tazobactam (ZOSYN)  IV  3.375 g Intravenous Q8H   Continuous Infusions: . dextrose 5 % and 0.45% NaCl       LOS: 4 days    Time spent: [place], MD [facility] Pager [phone]  If 7PM-7AM, please contact night-coverage [email or web address] Password [facility] 03/24/2016, 9:17 AM

## 2016-03-25 DIAGNOSIS — Z515 Encounter for palliative care: Secondary | ICD-10-CM

## 2016-03-25 LAB — CBC WITH DIFFERENTIAL/PLATELET
BASOS ABS: 0 10*3/uL (ref 0.0–0.1)
BASOS PCT: 0 %
Eosinophils Absolute: 0 10*3/uL (ref 0.0–0.7)
Eosinophils Relative: 0 %
HEMATOCRIT: 36.2 % (ref 36.0–46.0)
HEMOGLOBIN: 12.2 g/dL (ref 12.0–15.0)
LYMPHS PCT: 18 %
Lymphs Abs: 1.2 10*3/uL (ref 0.7–4.0)
MCH: 31.5 pg (ref 26.0–34.0)
MCHC: 33.7 g/dL (ref 30.0–36.0)
MCV: 93.5 fL (ref 78.0–100.0)
MONO ABS: 0.5 10*3/uL (ref 0.1–1.0)
Monocytes Relative: 7 %
NEUTROS ABS: 4.9 10*3/uL (ref 1.7–7.7)
NEUTROS PCT: 75 %
Platelets: 228 10*3/uL (ref 150–400)
RBC: 3.87 MIL/uL (ref 3.87–5.11)
RDW: 13.8 % (ref 11.5–15.5)
WBC: 6.6 10*3/uL (ref 4.0–10.5)

## 2016-03-25 LAB — BASIC METABOLIC PANEL
ANION GAP: 5 (ref 5–15)
BUN: 19 mg/dL (ref 6–20)
CHLORIDE: 110 mmol/L (ref 101–111)
CO2: 25 mmol/L (ref 22–32)
Calcium: 7.8 mg/dL — ABNORMAL LOW (ref 8.9–10.3)
Creatinine, Ser: 0.39 mg/dL — ABNORMAL LOW (ref 0.44–1.00)
GFR calc non Af Amer: >60 mL/min (ref >60)
GLUCOSE: 122 mg/dL — AB (ref 65–99)
POTASSIUM: 2.9 mmol/L — AB (ref 3.5–5.1)
Sodium: 140 mmol/L (ref 135–145)

## 2016-03-25 LAB — CULTURE, BLOOD (ROUTINE X 2): Culture: NO GROWTH

## 2016-03-25 LAB — LEGIONELLA PNEUMOPHILA TOTAL AB

## 2016-03-25 LAB — MAGNESIUM: Magnesium: 1.6 mg/dL — ABNORMAL LOW (ref 1.7–2.4)

## 2016-03-25 MED ORDER — SCOPOLAMINE 1 MG/3DAYS TD PT72
1.0000 | MEDICATED_PATCH | TRANSDERMAL | Status: DC
  Administered 2016-03-25 – 2016-03-28 (×2): 1.5 mg via TRANSDERMAL
  Filled 2016-03-25 (×2): qty 1

## 2016-03-25 MED ORDER — DEXTROSE-NACL 5-0.45 % IV SOLN: INTRAVENOUS | Status: DC

## 2016-03-25 MED ORDER — KCL IN DEXTROSE-NACL 40-5-0.45 MEQ/L-%-% IV SOLN
INTRAVENOUS | Status: DC
  Administered 2016-03-25: 12:00:00 via INTRAVENOUS
  Administered 2016-03-25: 1000 mL via INTRAVENOUS
  Administered 2016-03-26 – 2016-03-28 (×4): via INTRAVENOUS
  Filled 2016-03-25 (×7): qty 1000

## 2016-03-25 MED ORDER — POTASSIUM CHLORIDE 2 MEQ/ML IV SOLN
30.0000 meq | Freq: Once | INTRAVENOUS | Status: AC
  Administered 2016-03-25: 30 meq via INTRAVENOUS
  Filled 2016-03-25: qty 15

## 2016-03-25 MED ORDER — MAGNESIUM SULFATE 4 GM/100ML IV SOLN
4.0000 g | Freq: Once | INTRAVENOUS | Status: AC
  Administered 2016-03-25: 4 g via INTRAVENOUS
  Filled 2016-03-25: qty 100

## 2016-03-25 NOTE — Progress Notes (Signed)
PT Cancellation Note  Patient Details Name: [name] MRN: [id number] DOB: 1932/08/01   Cancelled Treatment:    Reason Eval/Treat Not Completed: PT screened, no needs identified, will sign off (Pt is total assist at baseline. 2 aides lift her to [facility] at home, otherwise she's bedbound. PT signing off. Pt's son aware and agrees pt doesn't need PT due to lack of rehab potential. )   Philomena Doheny 03/25/2016, 2:50 PM [phone]

## 2016-03-25 NOTE — Progress Notes (Signed)
[facility] MSW note: This is a hospice related admission. Pt had her eyes open but did not verbalize. Met with son-Brian and two private pay sitters. Pt appeared comfortable. No grimace of face. Met with son in private to discuss hospital discharge plans. Son plans to take pt home with continued [facility] and around the clock hired sitters. MSW educated son on [facility]. Son desires to take home first and if pt does not manage well at home he wants to consider [facility] as another option for care when she is eligible. Pt's other son-Rick from [place] is scheduled to arrive at the end of week to visit pt. Son expresses appreciation for all medical care given to pt. MSW offered support and education. Please call if questions.   Marilynne Halsted, MSW

## 2016-03-25 NOTE — Progress Notes (Signed)
[facility] 1223-[facility] of -[facility]IP RN Visit  This is a related, covered GIP admission from 03/20/16 to [facility] diagnosis of progressive supranuclear palsy, per Dr. Karie Georges. Patient is a DNR. Patient was evaluated by hospice RN in the home yesterday, where patient was noted to have vitals WNL and in no acute distress. Approximately an hour after the nurses's departure, patient became pale and experienced labored breathing. Patient's son activated EMS, due to the acute change in her respiratory status and to seek further evaluation. Patient was admitted for acute respiratory failure likely secondary to an aspiration pneumonia and sepsis.  Visited patient in room, with caregiver at bedside. Patient not responsive to verbal or tactile stimuli. Patient is now on 2 L [place] with O2 sats and respirations WNL.  Swallow study has been completed and NPO is still recommended. Patient's son, Aaron Edelman, and caregiver are with the patient.   Patient's son advised that he brought in patient's wheelchair from home and that he intends to get her into chair.  He insists that it helps with her contractures and that he will be better able to feed patient.   Patient is NPO at this time, but son states "I feel like they will progress her to something other than ice chips soon".   Hospice will continue to follow daily.  Please call with any hospice-related questions or concerns.  Thank you, Edyth Gunnels, RN, [facility] Liaison [phone]  [facility] liaisons are now on [place].

## 2016-03-25 NOTE — Progress Notes (Signed)
PROGRESS NOTE    Isabella George  ZOX:[id number] DOB: 1933-01-28 DOA: 03/20/2016 PCP: Hollace Kinnier, DO    Brief Narrative:  Patient is a 83 year old female with progressive supranuclear palsy with contractures was at home with hospice, debility and currently bedbound minimally verbal presented to the ED with acute respiratory distress placed on the BiPAP and being treated for healthcare associated pneumonia versus aspiration pneumonia and sepsis. Palliative care consullted.   Assessment & Plan:   Principal Problem:   Acute respiratory failure with hypoxia (HCC) Active Problems:   Sepsis ([place])   Aspiration pneumonia (HCC)   HCAP (healthcare-associated pneumonia)   Acid reflux   Essential hypertension   Hyperlipidemia   PSP (progressive supranuclear palsy) (HCC)   DNR (do not resuscitate)   Hypotension   Leukocytosis   Pressure injury of skin   Hypothermia  #1 acute respiratory failure with hypoxia likely secondary to healthcare associated pneumonia versus aspiration pneumonia Patient had presented with acute respiratory distress with increased work of breathing and was satting in the 70s on a nonrebreather on arrival to the ED. Patient placed off the BiPAP since yesterday afternoon, with improvement with respiratory status. Influenza PCR negative. RSV panel negative. Patient with a significant leukocytosis of 29.6 on 03/21/2016 which has slowly been trending back down and currently at 9.0 today with broad antibiotic coverage. Lactic acid level trending down currently at 0.9 from 2.2 from 3.5 from 2.83. Urine strep pneumococcus antigen negative. Afebrile. Discontinued IV vancomycin. Continue IV Zosyn D6/7, scheduled nebulizers. Continue suctioning. Follow.  #2 sepsis Questionable etiology. Likely secondary to healthcare associated pneumonia/aspiration pneumonia. Patient initially with a worsening white count of 29.6( 03/21/2016). WBC trending down with broader antibiotic coverage and  currently at 9.0.  Lactic acid level trending down. Patient afebrile and was hypothermic earlier on. Tachycardia improved. Some improvement with respiratory rate. Blood cultures with 1/2 gram-positive cocci in chains/alloiococcus otitis, likely contaminant however still susceptible to Zosyn. Influenza PCR negative. RSV panel negative. Urine cultures negative. Discontinued IV vancomycin. Continue IV Zosyn.  #3 hypotension Improved with hydration. Continue D51/2NS at 75 mL per hour, secondary to hypoglycemic episodes.  #4 progressive supranuclear palsy Patient in contractures, and has been steady lead declining over the past year. Patient was on hospice at home. However son states patient was placed on hospice due to her bad urinary tract infection from last hospitalization.  #5 leukocytosis Likely secondary to healthcare associated pneumonia versus aspiration pneumonia. Patient has been pancultured. Leukocytosis trending back down. One of 2 blood cultures with gram-positive cocci in chains/alloiococcus otitis, likely contaminant. Continue IV Zosyn.  #6 gastroesophageal reflux disease PPI.  #7 transient hypothermia Likely secondary to acute illness of pneumonia. Blood cultures pending with 1/2 preliminary readings of gram-positive cocci in chains. TSH 0.795. Continue empiric IV Zosyn. Follow.  #8 severe protein calorie malnutrition Secondary to poor oral intake. Patient now more alert today, patient has been seen in consultation by speech therapy were recommending nothing by mouth at this time. Palliative care following. May need to consider comfort feeds. Will place a scopolamine patch secondary to increased secretions.   #9 hypokalemia/hypomagnesemia Replete.  #10 prognosis Patient with a poor prognosis. Patient with progressive supranuclear palsy and currently with hospice at home presented with acute respiratory failure with hypoxia with concerns for aspiration pneumonia. Patient is  currently a DO NOT RESUSCITATE. It has been explained to patient's son who is a healthcare power of attorney that it is uncertain how well patient will do during this hospitalization.  Will try to treat a probable pneumonia, try to place on IV fluids for better blood pressure control and try to treat reversible things. If no significant improvement in the next few days, may need to consider transitioning to a full comfort measures. Palliative care has assessed the patient and met with the family for goals of care.      DVT prophylaxis: Lovenox Code Status: DO NOT RESUSCITATE Family Communication: Updated caregivers at bedside. Disposition Plan: Home with hospice once medically stable respiratory status back to baseline and tolerating oral intake versus comfort feeds because of antibiotics finished, versus residential hospice if no significant improvement and poor oral intake.    Consultants:   Palliative care: Dr Rowe Pavy 03/21/2016  Procedures:   Chest x-ray 03/20/2016, 03/21/2016  Antimicrobials:   IV vancomycin 03/20/2016>>>>>03/24/2016  IV cefepime 03/20/2016>>>> 03/21/2016  IV Zosyn 03/21/2016   Subjective: Patient is alert with eyes open and tracking. Patient is nonverbal.  Objective: Vitals:   03/25/16 0024 03/25/16 0425 03/25/16 0618 03/25/16 0946  BP:   (!) 121/44   Pulse:   70   Resp:   18   Temp: 97.4 F (36.3 C) 97.8 F (36.6 C) 97.7 F (36.5 C)   TempSrc: Rectal Rectal Rectal   SpO2:   97% 94%  Weight:      Height:        Intake/Output Summary (Last 24 hours) at 03/25/16 1053 Last data filed at 03/24/16 1100  Gross per 24 hour  Intake               75 ml  Output              115 ml  Net              -40 ml   Filed Weights   03/20/16 2124  Weight: 41.3 kg (91 lb 0.8 oz)    Examination:  General exam: Appears comfortable On [place] oxygen at 2.5 L. Alert. Eyes open. Respiratory system: CTAB. No crackles. Respiratory effort normal. Cardiovascular  system: S1 & S2 heard, RRR. No JVD, murmurs, rubs, gallops or clicks. No pedal edema. Gastrointestinal system: Abdomen is nondistended, soft and nontender. No organomegaly or masses felt. Normal bowel sounds heard. Central nervous system: Alert. No focal neurological deficits. Extremities: Extremities in contractures. Skin: No rashes, lesions or ulcers Psychiatry: Unable to assess.    Data Reviewed: I have personally reviewed following labs and imaging studies  CBC:  Recent Labs Lab 03/20/16 1403 03/21/16 0326 03/22/16 0352 03/23/16 0312 03/24/16 0325 03/25/16 0407  WBC 10.6* 29.6* 20.6* 15.5* 9.0 6.6  NEUTROABS 8.7* 27.5* 18.4*  --  7.5 4.9  HGB 14.9 12.6 11.0* 12.3 11.8* 12.2  HCT 43.3 37.5 32.9* 35.2* 34.4* 36.2  MCV 93.5 93.3 92.4 93.9 94.2 93.5  PLT 310 256 210 212 170 458   Basic Metabolic Panel:  Recent Labs Lab 03/20/16 2139 03/21/16 0326 03/22/16 0352 03/23/16 0312 03/24/16 0325 03/25/16 0407  NA  --  139 140 140 143 140  K  --  4.1 3.5 3.5 3.9 2.9*  CL  --  107 111 111 113* 110  CO2  --  23 22 21* 21* 25  GLUCOSE  --  127* 81 65 57* 122*  BUN  --  16 21* 29* 27* 19  CREATININE  --  0.55 0.40* 0.36* 0.44 0.39*  CALCIUM  --  8.5* 8.4* 8.3* 8.0* 7.8*  MG 1.8  --   --   --   --  1.6*   GFR: Estimated Creatinine Clearance: 36 mL/min (by C-G formula based on SCr of 0.39 mg/dL (L)). Liver Function Tests:  Recent Labs Lab 03/20/16 1403 03/21/16 0326  AST 32 32  ALT 29 30  ALKPHOS 77 68  BILITOT 0.8 0.9  PROT 6.2* 5.9*  ALBUMIN 3.5 3.3*   No results for input(s): LIPASE, AMYLASE in the last 168 hours. No results for input(s): AMMONIA in the last 168 hours. Coagulation Profile:  Recent Labs Lab 03/20/16 2137  INR 1.21   Cardiac Enzymes: No results for input(s): CKTOTAL, CKMB, CKMBINDEX, TROPONINI in the last 168 hours. BNP (last 3 results) No results for input(s): PROBNP in the last 8760 hours. HbA1C: No results for input(s): HGBA1C in the  last 72 hours. CBG:  Recent Labs Lab 03/24/16 0535 03/24/16 0608  GLUCAP 57* 120*   Lipid Profile: No results for input(s): CHOL, HDL, LDLCALC, TRIG, CHOLHDL, LDLDIRECT in the last 72 hours. Thyroid Function Tests:  Recent Labs  03/24/16 0951  TSH 0.795   Anemia Panel: No results for input(s): VITAMINB12, FOLATE, FERRITIN, TIBC, IRON, RETICCTPCT in the last 72 hours. Sepsis Labs:  Recent Labs Lab 03/20/16 1413 03/20/16 2137 03/21/16 0751 03/22/16 0805  PROCALCITON  --  4.70  --   --   LATICACIDVEN 2.83* 3.5* 2.2* 0.9    Recent Results (from the past 240 hour(s))  Culture, blood (Routine X 2) w Reflex to ID Panel     Status: Abnormal   Collection Time: 03/20/16  2:03 PM  Result Value Ref Range Status   Specimen Description BLOOD RIGHT ANTECUBITAL  Final   Special Requests BOTTLES DRAWN AEROBIC AND ANAEROBIC 5CC  Final   Culture  Setup Time   Final    GRAM POSITIVE COCCI IN CHAINS ANAEROBIC BOTTLE ONLY CRITICAL RESULT CALLED TO, READ BACK BY AND VERIFIED WITH: M LILLISTON,PHARMD AT 1302 03/22/16 BY L BENFIELD    Culture (A)  Final    ALLOIOCOCCUS OTITIS Standardized susceptibility testing for this organism is not available. Performed at [facility]    Report Status 03/24/2016 FINAL  Final  Blood Culture ID Panel (Reflexed)     Status: None   Collection Time: 03/20/16  2:03 PM  Result Value Ref Range Status   Enterococcus species NOT DETECTED NOT DETECTED Final   Listeria monocytogenes NOT DETECTED NOT DETECTED Final   Staphylococcus species NOT DETECTED NOT DETECTED Final   Staphylococcus aureus NOT DETECTED NOT DETECTED Final   Streptococcus species NOT DETECTED NOT DETECTED Final   Streptococcus agalactiae NOT DETECTED NOT DETECTED Final   Streptococcus pneumoniae NOT DETECTED NOT DETECTED Final   Streptococcus pyogenes NOT DETECTED NOT DETECTED Final   Acinetobacter baumannii NOT DETECTED NOT DETECTED Final   Enterobacteriaceae species NOT DETECTED  NOT DETECTED Final   Enterobacter cloacae complex NOT DETECTED NOT DETECTED Final   Escherichia coli NOT DETECTED NOT DETECTED Final   Klebsiella oxytoca NOT DETECTED NOT DETECTED Final   Klebsiella pneumoniae NOT DETECTED NOT DETECTED Final   Proteus species NOT DETECTED NOT DETECTED Final   Serratia marcescens NOT DETECTED NOT DETECTED Final   Haemophilus influenzae NOT DETECTED NOT DETECTED Final   Neisseria meningitidis NOT DETECTED NOT DETECTED Final   Pseudomonas aeruginosa NOT DETECTED NOT DETECTED Final   Candida albicans NOT DETECTED NOT DETECTED Final   Candida glabrata NOT DETECTED NOT DETECTED Final   Candida krusei NOT DETECTED NOT DETECTED Final   Candida parapsilosis NOT DETECTED NOT DETECTED Final   Candida tropicalis  NOT DETECTED NOT DETECTED Final  Culture, blood (Routine X 2) w Reflex to ID Panel     Status: None (Preliminary result)   Collection Time: 03/20/16  2:24 PM  Result Value Ref Range Status   Specimen Description BLOOD LEFT HAND  Final   Special Requests IN PEDIATRIC BOTTLE [place]  Final   Culture   Final    NO GROWTH 4 DAYS Performed at [facility]    Report Status PENDING  Incomplete  Respiratory Panel by PCR     Status: None   Collection Time: 03/20/16  6:00 PM  Result Value Ref Range Status   Adenovirus NOT DETECTED NOT DETECTED Final   Coronavirus 229E NOT DETECTED NOT DETECTED Final   Coronavirus HKU1 NOT DETECTED NOT DETECTED Final   Coronavirus NL63 NOT DETECTED NOT DETECTED Final   Coronavirus OC43 NOT DETECTED NOT DETECTED Final   Metapneumovirus NOT DETECTED NOT DETECTED Final   Rhinovirus / Enterovirus NOT DETECTED NOT DETECTED Final   Influenza A NOT DETECTED NOT DETECTED Final   Influenza B NOT DETECTED NOT DETECTED Final   Parainfluenza Virus 1 NOT DETECTED NOT DETECTED Final   Parainfluenza Virus 2 NOT DETECTED NOT DETECTED Final   Parainfluenza Virus 3 NOT DETECTED NOT DETECTED Final   Parainfluenza Virus 4 NOT DETECTED NOT  DETECTED Final   Respiratory Syncytial Virus NOT DETECTED NOT DETECTED Final   Bordetella pertussis NOT DETECTED NOT DETECTED Final   Chlamydophila pneumoniae NOT DETECTED NOT DETECTED Final   Mycoplasma pneumoniae NOT DETECTED NOT DETECTED Final    Comment: Performed at [facility]  MRSA PCR Screening     Status: None   Collection Time: 03/20/16  6:00 PM  Result Value Ref Range Status   MRSA by PCR NEGATIVE NEGATIVE Final    Comment:        The GeneXpert MRSA Assay (FDA approved for NASAL specimens only), is one component of a comprehensive MRSA colonization surveillance program. It is not intended to diagnose MRSA infection nor to guide or monitor treatment for MRSA infections.   Culture, Urine     Status: None   Collection Time: 03/20/16  9:40 PM  Result Value Ref Range Status   Specimen Description URINE, CATHETERIZED  Final   Special Requests NONE  Final   Culture NO GROWTH Performed at [facility]   Final   Report Status 03/22/2016 FINAL  Final         Radiology Studies: No results found.      Scheduled Meds: . chlorhexidine  15 mL Mouth Rinse BID  . enoxaparin (LOVENOX) injection  30 mg Subcutaneous Q24H  . ipratropium-albuterol  3 mL Nebulization TID  . magnesium sulfate 1 - 4 g bolus IVPB  4 g Intravenous Once  . mouth rinse  15 mL Mouth Rinse q12n4p  . pantoprazole (PROTONIX) IV  40 mg Intravenous Q24H  . piperacillin-tazobactam (ZOSYN)  IV  3.375 g Intravenous Q8H  . potassium chloride (KCL MULTIRUN) 30 mEq in 265 mL IVPB  30 mEq Intravenous Once  . scopolamine  1 patch Transdermal Q72H   Continuous Infusions: . dextrose 5 % and 0.45 % NaCl with KCl 40 mEq/L       LOS: 5 days    Time spent: [place], MD [facility] Pager [phone]  If 7PM-7AM, please contact night-coverage [email or web address] Password [facility] 03/25/2016, 10:53 AM

## 2016-03-25 NOTE — Progress Notes (Signed)
Speech Language Pathology Treatment: Dysphagia  Patient Details Name: Isabella George MRN: [id number] DOB: 10-01-1932 Today's Date: 03/25/2016 Time: [id number] SLP Time Calculation (min) (ACUTE ONLY): 45 min  Assessment / Plan / Recommendation Clinical Impression  Pt with no discernable improvement since yesterday's evaluation.  Son, Isabella George, and three caregivers from home present.  Pt was transferred to her wheelchair/repositioned for optimal participation.  Limited responsiveness despite max verbal/visual/tactile cues.  Pt with poor anticipation of spoon arriving at lips; bite reflex apparent; nonpurposeful oral movements observed, but not functional for eating/drinking.  No spontaneous swallow response observed despite great effort from her caregivers.  Discussed importance of vigilant oral care and the exacerbated aspiration risk due to progression of disease and poor functional reserve.  Pt's son had many questions about swallowing function, oral care.  He appears to be realistic about prognosis, but his goal is to get his mother back home where she is more comfortable.  SLP will f/u next am for ongoing assessment and to determine potential for return to baseline.  Isabella George agrees with plan.     HPI HPI: 83 year old female admitted 03/20/16 due to acute respiratory failure. PMH significant for PSP, GERD, esophagitis, breast CA. CXR reveals patchy RLL infiltrate c/w PNA.       SLP Plan  Continue with current plan of care     Recommendations  Diet recommendations: NPO  Caregivers may offer pt limited POs this evening if her MS improves and she shows an interest in eating.                Oral Care Recommendations: Oral care QID Follow up Recommendations: 24 hour supervision/assistance Plan: Continue with current plan of care       GO                Juan Quam Laurice 03/25/2016, 5:22 PM  Akshitha Culmer L. Tivis Ringer, [place] CCC/SLP Pager [phone]

## 2016-03-26 DIAGNOSIS — I10 Essential (primary) hypertension: Secondary | ICD-10-CM

## 2016-03-26 DIAGNOSIS — J690 Pneumonitis due to inhalation of food and vomit: Secondary | ICD-10-CM

## 2016-03-26 DIAGNOSIS — Z515 Encounter for palliative care: Secondary | ICD-10-CM

## 2016-03-26 DIAGNOSIS — T68XXXD Hypothermia, subsequent encounter: Secondary | ICD-10-CM

## 2016-03-26 DIAGNOSIS — G231 Progressive supranuclear ophthalmoplegia [Steele-Richardson-Olszewski]: Secondary | ICD-10-CM

## 2016-03-26 DIAGNOSIS — J9601 Acute respiratory failure with hypoxia: Secondary | ICD-10-CM

## 2016-03-26 LAB — BASIC METABOLIC PANEL
ANION GAP: 3 — AB (ref 5–15)
BUN: 10 mg/dL (ref 6–20)
CALCIUM: 7.6 mg/dL — AB (ref 8.9–10.3)
CO2: 26 mmol/L (ref 22–32)
Chloride: 109 mmol/L (ref 101–111)
Creatinine, Ser: <0.3 mg/dL — ABNORMAL LOW (ref 0.44–1.00)
GLUCOSE: 118 mg/dL — AB (ref 65–99)
Potassium: 3.5 mmol/L (ref 3.5–5.1)
SODIUM: 138 mmol/L (ref 135–145)

## 2016-03-26 LAB — MAGNESIUM: MAGNESIUM: 2 mg/dL (ref 1.7–2.4)

## 2016-03-26 NOTE — Progress Notes (Signed)
Pt from home with son and caregiver. Pt is active with [facility] for home hospice services and will return back home at discharge. CM will continue to follow and assist as needed. Marney Doctor RN,BSN,NCM [phone]

## 2016-03-26 NOTE — Progress Notes (Signed)
SLP Cancellation Note  Patient Details Name: Isabella George MRN: [id number] DOB: [birth year]   Cancelled treatment:       Reason Eval/Treat Not Completed: Fatigue/lethargy limiting ability to participate. Caregiver present, who reports pt was awake earlier, but has since fallen back asleep. Per caregiver, pt's son Aaron Edelman will be here later, and would "probably like to be here when we try again". SLP available via pager once pt is alert and seated in wheelchair. Will continue efforts.  Maija Biggers B. [place], [facility], [place]  Shonna Chock 03/26/2016, 9:10 AM

## 2016-03-26 NOTE — Progress Notes (Signed)
Speech Language Pathology Treatment: Dysphagia  Patient Details Name: Isabella George MRN: [id number] DOB: Feb 17, 1933 Today's Date: 03/26/2016 Time: [id number] SLP Time Calculation (min) (ACUTE ONLY): 37 min  Assessment / Plan / Recommendation Clinical Impression  Pt was seen today to determine appropriateness for po intake. Pt more alert today, seated in wheelchair. Caregivers present, providing nectar thick liquid via teaspoon. Pt exhibited delayed oral prep and propulsion and/or delayed swallow reflex, with multiple audible swallows. No change in voice quality or cough response noted after any trial, however, silent aspiration cannot be detected at bedside. Caregiver indicated pt's son Aaron Edelman wanted SLP to call. Lengthy phone conversation with son regarding pt/family wishes of continued po intake with known high aspiration risk, and no PEG placement. Oral care with suction is recommended before and after po intake. Caregivers were educated regarding importance of oral care, that pt's appropriateness for po intake will continue to be highly variable, and that pt is at very high risk of aspiration and continued episodes of PNA. They were encouraged to keep in mind that if the benefit of po intake outweighs the risk, proceed cautiously with po intake. Written information reviewed and provided. All education completed. ST to sign off.   HPI HPI: 83 year old female admitted 03/20/16 due to acute respiratory failure. PMH significant for PSP, GERD, esophagitis, breast CA. CXR reveals patchy RLL infiltrate c/w PNA.       SLP Plan  Continue with current plan of care     Recommendations  Diet recommendations: Dysphagia 1 (puree);Nectar-thick liquid Liquids provided via: Teaspoon Medication Administration: Crushed with puree Supervision: Full supervision/cueing for compensatory strategies;Trained caregiver to feed patient Compensations: Minimize environmental distractions;Slow rate;Small  sips/bites Postural Changes and/or Swallow Maneuvers: Seated upright 90 degrees;Upright 30-60 min after meal                Oral Care Recommendations: Oral care before and after PO Follow up Recommendations: 24 hour supervision/assistance Plan: Continue with current plan of care                Celia B. Qu[place] [facility], CCC-SLP [id number]    Shonna Chock 03/26/2016, 1:12 PM

## 2016-03-26 NOTE — Progress Notes (Signed)
PROGRESS NOTE  Isabella George JJO:[id number] DOB: October 14, 1932 DOA: 03/20/2016 PCP: Hollace Kinnier, DO  Brief History:  Patient is a 83 year old female with progressive supranuclear palsy with contractures was at home with hospice, debility and currently bedbound minimally verbal presented to the ED with acute respiratory distress placed on the BiPAP and being treated for healthcare associated pneumonia versus aspiration pneumonia and sepsis. Palliative care consulted and discussed with patient's son.  Assessment/Plan: acute respiratory failure with hypoxia likely secondary to healthcare associated pneumonia versus aspiration pneumonia -Patient had presented with acute respiratory distress with increased work of breathing and was satting in the 70s on a nonrebreather on arrival to the ED. -weaned off BiPAP--stable on 3 L [place] - Influenza PCR negative. RSV panel negative.  Sepsis -secondary to healthcare associated pneumonia/aspiration pneumonia. -initially with a worsening white count of 29.6( 03/21/2016).  -WBC improved  - Lactic acid level trending down.  -Patient afebrile and was hypothermic earlier on. Tachycardia improved. Some improvement with respiratory rate.  -Blood cultures with 1/2 gram-positive cocci in chains/alloiococcus otitis, likely contaminant however still susceptible to Zosyn. Influenza PCR negative. RSV panel negative. Urine cultures negative.  -Discontinued IV vancomycin. Continue IV Zosyn.   hypotension -Improved with hydration.  Continue D51/2NS at 75 mL per hour, secondary to hypoglycemic episodes.  progressive supranuclear palsy Patient in contractures, and has been steady lead declining over the past year. Patient was on hospice at home. However son states patient was placed on hospice due to her bad urinary tract infection from last hospitalization.  leukocytosis Likely secondary to healthcare associated pneumonia versus aspiration pneumonia.    -improved  gastroesophageal reflux disease PPI.  transient hypothermia -resolved  severe protein calorie malnutrition -Secondary to poor oral intake.  -patient has been seen in consultation by speech therapy were recommending nothing by mouth at this time.  -Palliative care following. May need to consider comfort feeds.   -scopolamine patch secondary to increased secretions.   hypokalemia/hypomagnesemia Repleted.  prognosis -Patient with a poor prognosis.  -Patient with progressive supranuclear palsy and currently with hospice at home presented with acute respiratory failure with hypoxia with concerns for aspiration pneumonia.  -Patient is currently a DO NOT RESUSCITATE.  -It has been explained to patient's son who is a healthcare power of attorney that it is uncertain how well patient will do during this hospitalization - treat reversible things. If no significant improvement in the next few days, may need to consider transitioning to a full comfort measures. Palliative care has assessed the patient and met with the family for goals of care.    Disposition Plan:   Home in 1-2 days  Family Communication:   Caregivers updated at bedside  Consultants:  none  Code Status: DNR  DVT Prophylaxis:  [place] Lovenox   Procedures: As Listed in Progress Note Above  Antibiotics: Zosyn 03/21/16>>>    Subjective: Patient is awake, but does not answer any questions. She is nonverbal. No events overnight. Patient rested comfortably. There is no respiratory distress, vomiting, diarrhea.  Objective: Vitals:   03/25/16 2254 03/26/16 0458 03/26/16 0951 03/26/16 0954  BP: 119/63 117/60    Pulse: 82 80    Resp: 20 16    Temp: 97.4 F (36.3 C) 97.6 F (36.4 C)    TempSrc: Axillary Axillary    SpO2: 95% 100% 97% 97%  Weight:      Height:        Intake/Output Summary (Last 24  hours) at 03/26/16 1039 Last data filed at 03/26/16 0045  Gross per 24 hour  Intake              415  ml  Output              925 ml  Net             -510 ml   Weight change:  Exam:   General:  Pt is alert,  not in acute distress  HEENT: No icterus, No thrush, No neck mass, [place]/AT  Cardiovascular: RRR, S1/S2, no rubs, no gallops  Respiratory: Bibasilar crackles. No wheezing. Good air movement.  Abdomen: Soft/+BS, non tender, non distended, no guarding  Extremities: No edema, No lymphangitis, No petechiae, No rashes, no synovitis   Data Reviewed: I have personally reviewed following labs and imaging studies Basic Metabolic Panel:  Recent Labs Lab 03/20/16 2139  03/22/16 0352 03/23/16 0312 03/24/16 0325 03/25/16 0407 03/26/16 0414  NA  --   < > 140 140 143 140 138  K  --   < > 3.5 3.5 3.9 2.9* 3.5  CL  --   < > 111 111 113* 110 109  CO2  --   < > 22 21* 21* 25 26  GLUCOSE  --   < > 81 65 57* 122* 118*  BUN  --   < > 21* 29* 27* 19 10  CREATININE  --   < > 0.40* 0.36* 0.44 0.39* <0.30*  CALCIUM  --   < > 8.4* 8.3* 8.0* 7.8* 7.6*  MG 1.8  --   --   --   --  1.6* 2.0  < > = values in this interval not displayed. Liver Function Tests:  Recent Labs Lab 03/20/16 1403 03/21/16 0326  AST 32 32  ALT 29 30  ALKPHOS 77 68  BILITOT 0.8 0.9  PROT 6.2* 5.9*  ALBUMIN 3.5 3.3*   No results for input(s): LIPASE, AMYLASE in the last 168 hours. No results for input(s): AMMONIA in the last 168 hours. Coagulation Profile:  Recent Labs Lab 03/20/16 2137  INR 1.21   CBC:  Recent Labs Lab 03/20/16 1403 03/21/16 0326 03/22/16 0352 03/23/16 0312 03/24/16 0325 03/25/16 0407  WBC 10.6* 29.6* 20.6* 15.5* 9.0 6.6  NEUTROABS 8.7* 27.5* 18.4*  --  7.5 4.9  HGB 14.9 12.6 11.0* 12.3 11.8* 12.2  HCT 43.3 37.5 32.9* 35.2* 34.4* 36.2  MCV 93.5 93.3 92.4 93.9 94.2 93.5  PLT 310 256 210 212 170 228   Cardiac Enzymes: No results for input(s): CKTOTAL, CKMB, CKMBINDEX, TROPONINI in the last 168 hours. BNP: Invalid input(s): POCBNP CBG:  Recent Labs Lab 03/24/16 0535  03/24/16 0608  GLUCAP 57* 120*   HbA1C: No results for input(s): HGBA1C in the last 72 hours. Urine analysis:    Component Value Date/Time   COLORURINE YELLOW 03/20/2016 2140   APPEARANCEUR CLOUDY (A) 03/20/2016 2140   LABSPEC 1.021 03/20/2016 2140   PHURINE 5.0 03/20/2016 2140   GLUCOSEU NEGATIVE 03/20/2016 2140   HGBUR NEGATIVE 03/20/2016 2140   BILIRUBINUR NEGATIVE 03/20/2016 2140   [place] 03/20/2016 2140   PROTEINUR 30 (A) 03/20/2016 2140   UROBILINOGEN 0.2 07/03/2011 1300   NITRITE NEGATIVE 03/20/2016 2140   LEUKOCYTESUR NEGATIVE 03/20/2016 2140   Sepsis Labs: @LABRCNTIP (procalcitonin:4,lacticidven:4) ) Recent Results (from the past 240 hour(s))  Culture, blood (Routine X 2) w Reflex to ID Panel     Status: Abnormal   Collection Time: 03/20/16  2:03 PM  Result Value Ref Range Status   Specimen Description BLOOD RIGHT ANTECUBITAL  Final   Special Requests BOTTLES DRAWN AEROBIC AND ANAEROBIC 5CC  Final   Culture  Setup Time   Final    GRAM POSITIVE COCCI IN CHAINS ANAEROBIC BOTTLE ONLY CRITICAL RESULT CALLED TO, READ BACK BY AND VERIFIED WITH: M LILLISTON,PHARMD AT 1302 03/22/16 BY L BENFIELD    Culture (A)  Final    ALLOIOCOCCUS OTITIS Standardized susceptibility testing for this organism is not available. Performed at [facility]    Report Status 03/24/2016 FINAL  Final  Blood Culture ID Panel (Reflexed)     Status: None   Collection Time: 03/20/16  2:03 PM  Result Value Ref Range Status   Enterococcus species NOT DETECTED NOT DETECTED Final   Listeria monocytogenes NOT DETECTED NOT DETECTED Final   Staphylococcus species NOT DETECTED NOT DETECTED Final   Staphylococcus aureus NOT DETECTED NOT DETECTED Final   Streptococcus species NOT DETECTED NOT DETECTED Final   Streptococcus agalactiae NOT DETECTED NOT DETECTED Final   Streptococcus pneumoniae NOT DETECTED NOT DETECTED Final   Streptococcus pyogenes NOT DETECTED NOT DETECTED Final    Acinetobacter baumannii NOT DETECTED NOT DETECTED Final   Enterobacteriaceae species NOT DETECTED NOT DETECTED Final   Enterobacter cloacae complex NOT DETECTED NOT DETECTED Final   Escherichia coli NOT DETECTED NOT DETECTED Final   Klebsiella oxytoca NOT DETECTED NOT DETECTED Final   Klebsiella pneumoniae NOT DETECTED NOT DETECTED Final   Proteus species NOT DETECTED NOT DETECTED Final   Serratia marcescens NOT DETECTED NOT DETECTED Final   Haemophilus influenzae NOT DETECTED NOT DETECTED Final   Neisseria meningitidis NOT DETECTED NOT DETECTED Final   Pseudomonas aeruginosa NOT DETECTED NOT DETECTED Final   Candida albicans NOT DETECTED NOT DETECTED Final   Candida glabrata NOT DETECTED NOT DETECTED Final   Candida krusei NOT DETECTED NOT DETECTED Final   Candida parapsilosis NOT DETECTED NOT DETECTED Final   Candida tropicalis NOT DETECTED NOT DETECTED Final  Culture, blood (Routine X 2) w Reflex to ID Panel     Status: None   Collection Time: 03/20/16  2:24 PM  Result Value Ref Range Status   Specimen Description BLOOD LEFT HAND  Final   Special Requests IN PEDIATRIC BOTTLE 1CC  Final   Culture   Final    NO GROWTH 5 DAYS Performed at [facility], [address] [place], [place] [address]    Report Status 03/25/2016 FINAL  Final  Respiratory Panel by PCR     Status: None   Collection Time: 03/20/16  6:00 PM  Result Value Ref Range Status   Adenovirus NOT DETECTED NOT DETECTED Final   Coronavirus 229E NOT DETECTED NOT DETECTED Final   Coronavirus HKU1 NOT DETECTED NOT DETECTED Final   Coronavirus NL63 NOT DETECTED NOT DETECTED Final   Coronavirus OC43 NOT DETECTED NOT DETECTED Final   Metapneumovirus NOT DETECTED NOT DETECTED Final   Rhinovirus / Enterovirus NOT DETECTED NOT DETECTED Final   Influenza A NOT DETECTED NOT DETECTED Final   Influenza B NOT DETECTED NOT DETECTED Final   Parainfluenza Virus 1 NOT DETECTED NOT DETECTED Final   Parainfluenza Virus 2 NOT  DETECTED NOT DETECTED Final   Parainfluenza Virus 3 NOT DETECTED NOT DETECTED Final   Parainfluenza Virus 4 NOT DETECTED NOT DETECTED Final   Respiratory Syncytial Virus NOT DETECTED NOT DETECTED Final   Bordetella pertussis NOT DETECTED NOT DETECTED Final   Chlamydophila pneumoniae  NOT DETECTED NOT DETECTED Final   Mycoplasma pneumoniae NOT DETECTED NOT DETECTED Final    Comment: Performed at [facility]  MRSA PCR Screening     Status: None   Collection Time: 03/20/16  6:00 PM  Result Value Ref Range Status   MRSA by PCR NEGATIVE NEGATIVE Final    Comment:        The GeneXpert MRSA Assay (FDA approved for NASAL specimens only), is one component of a comprehensive MRSA colonization surveillance program. It is not intended to diagnose MRSA infection nor to guide or monitor treatment for MRSA infections.   Culture, Urine     Status: None   Collection Time: 03/20/16  9:40 PM  Result Value Ref Range Status   Specimen Description URINE, CATHETERIZED  Final   Special Requests NONE  Final   Culture NO GROWTH Performed at [facility]   Final   Report Status 03/22/2016 FINAL  Final     Scheduled Meds: . chlorhexidine  15 mL Mouth Rinse BID  . enoxaparin (LOVENOX) injection  30 mg Subcutaneous Q24H  . ipratropium-albuterol  3 mL Nebulization TID  . mouth rinse  15 mL Mouth Rinse q12n4p  . pantoprazole (PROTONIX) IV  40 mg Intravenous Q24H  . piperacillin-tazobactam (ZOSYN)  IV  3.375 g Intravenous Q8H  . scopolamine  1 patch Transdermal Q72H   Continuous Infusions: . dextrose 5 % and 0.45 % NaCl with KCl 40 mEq/L 1,000 mL (03/25/16 2340)    Procedures/Studies: Dg Chest Port 1 View  Result Date: 03/21/2016 CLINICAL DATA:  Respiratory failure EXAM: PORTABLE CHEST 1 VIEW COMPARISON:  March 20, 2016 FINDINGS: A small portion of the left base is not visualized. In the visualized regions, there is persistent patchy opacity in the right lower lobe, stable. Lungs  elsewhere clear. Heart is mildly enlarged with pulmonary vascularity within normal limits. There is atherosclerotic calcification in the aorta. No pneumothorax. No adenopathy. There are surgical clips in the left axillary region. IMPRESSION: Note that a portion of the left base is not visualized. There is patchy infiltrate consistent with pneumonia in the right lower lobe, stable. No new opacity evident in regions visualized. Stable cardiac prominence. There is aortic atherosclerosis. Electronically Signed   By: Lowella Grip III M.D.   On: 03/21/2016 07:09   Dg Chest Port 1 View  Result Date: 03/20/2016 CLINICAL DATA:  Shortness of breath.  Unresponsive patient. EXAM: PORTABLE CHEST 1 VIEW COMPARISON:  03/08/2015 FINDINGS: The cardiac silhouette is mildly enlarged. Mediastinal contours appear intact. There is no evidence of focal airspace consolidation, pleural effusion or pneumothorax. There is left lower lobe atelectasis. Osseous structures are without acute abnormality. Postsurgical changes in the left chest wall. Soft tissues are otherwise grossly normal. IMPRESSION: Stably enlarged cardiac silhouette, mild. Left lower lobe atelectasis. Electronically Signed   By: Fidela Salisbury M.D.   On: 03/20/2016 14:09    Vivi Piccirilli, DO  [facility] Pager [phone]  If 7PM-7AM, please contact night-coverage [email or web address] Password [email or web address] 03/26/2016, 10:39 AM   LOS: 6 days

## 2016-03-26 NOTE — Progress Notes (Addendum)
[facility] 1223-[facility] [place]-GIP RN Visit  0900  This is a related, covered GIP admission from 03/20/16 to [facility] diagnosis of progressive supranuclear palsy, per Dr. Karie Georges. Patient is a DNR. Patient was evaluated by hospice RN in the home yesterday, where patient was noted to have vitals WNL and in no acute distress. Approximately an hour after the nurses's departure, patient became pale and experienced labored breathing. Patient's son activated EMS, due to the acute change in her respiratory status and to seek further evaluation. Patient was admitted for acute respiratory failure likely secondary to an aspiration pneumonia and sepsis.  Visited patient in room, with caregiver at bedside.  Patient was peaceful looking and in NAD. Patient not responsive to verbal or tactile stimuli. Patient is now on 2 L [place] with O2 sats and respirations WNL. Swallow study has been completed and NPO is still recommended.  Per caregiver, they tried to get patient up into the wheelchair yesterday to optimize participation.  She did not do well.   Caregiver states that she cannot eat/drink and the same was confirmed with medical notes from speech/language.   Per RN notes, patient foley was d/c'd yesterday and she has had large incontinent void since.  Hospice will continue to follow daily.  Please call with any hospice-related questions or concerns.  Thank you, Edyth Gunnels, RN, [facility] Liaison [phone]  [facility] liaisons are now on [place].

## 2016-03-26 NOTE — Progress Notes (Signed)
Nursing Note: Foley discontinued on previous shift.No void in 8 hrs.A: Bladder scanned by Geraldine,NT for 425 cc.R: Pt in and out cathed per policy for 0000000 cc of clear yellow urine.Pt tolerated well.wbb

## 2016-03-26 NOTE — Progress Notes (Signed)
Daily Progress Note   Patient Name: Isabella George       Date: 03/26/2016 DOB: [birth year]  Age: 83 y.o. MRN#: [id number] Attending Physician: Orson Eva, MD Primary Care Physician: Hollace Kinnier, DO Admit Date: 03/20/2016  Reason for Consultation/Follow-up: Establishing GOC 2/2 to overall failure to thrive 2/2 supranuclear palsy   Subjective: - met at bedside with son Aaron Edelman for continued discussion regarding current medical situation and goals of care for his mother Cleola Perryman.  I met Aaron Edelman and his caregivers one year ago for similar conversation  -conversation was dominated by son's verbalization of his overall plan of care for his mother.  He has 24/7 caregivers at the beside, currently they are repositioning patient and performing PROM exercises.  Request to dc foley  -patient is non verbal, bed bound and dependant for all ADLs.  Now with very limited po intake  Length of Stay: 6  Current Medications: Scheduled Meds:  . chlorhexidine  15 mL Mouth Rinse BID  . enoxaparin (LOVENOX) injection  30 mg Subcutaneous Q24H  . ipratropium-albuterol  3 mL Nebulization TID  . mouth rinse  15 mL Mouth Rinse q12n4p  . pantoprazole (PROTONIX) IV  40 mg Intravenous Q24H  . piperacillin-tazobactam (ZOSYN)  IV  3.375 g Intravenous Q8H  . scopolamine  1 patch Transdermal Q72H    Continuous Infusions: . dextrose 5 % and 0.45 % NaCl with KCl 40 mEq/L 75 mL/hr at 03/26/16 1230    PRN Meds: acetaminophen, albuterol, guaiFENesin-dextromethorphan, HYDROcodone-acetaminophen, ipratropium, lip balm, morphine injection, ondansetron (ZOFRAN) IV, sorbitol  Physical Exam  Constitutional: She appears cachectic. She appears ill.  Cardiovascular: Normal rate, regular rhythm and normal heart sounds.     Pulmonary/Chest: She has decreased breath sounds in the right lower field and the left lower field.  Musculoskeletal:  generalized muscle atrophy and contractures            Vital Signs: BP 129/75 (BP Location: Right Leg)   Pulse 90   Temp 97.7 F (36.5 C) (Axillary)   Resp 16   Ht 5' 8.9" (1.75 m)   Wt 41.3 kg (91 lb 0.8 oz)   SpO2 97%   BMI 13.49 kg/m  SpO2: SpO2: 97 % O2 Device: O2 Device: Nasal Cannula O2 Flow Rate: O2 Flow Rate (L/min): 2 L/min  Intake/output summary:  Intake/Output Summary (Last 24 hours) at 03/26/16 1608 Last data filed at 03/26/16 0945  Gross per 24 hour  Intake              100 ml  Output              600 ml  Net             -500 ml   LBM: Last BM Date: 03/25/16 Baseline Weight: Weight: 41.3 kg (91 lb 0.8 oz) Most recent weight: Weight: 41.3 kg (91 lb 0.8 oz)       Palliative Assessment/Data: 20% at best    Flowsheet Rows   Flowsheet Row Most Recent Value  Intake Tab  Referral Department  Hospitalist  Unit at Time of Referral  ICU  Palliative Care Primary Diagnosis  Neurology  Date Notified  03/21/16  Palliative Care Type  Return patient Palliative Care  Reason for referral  Clarify Goals of Care, Counsel Regarding Hospice  Date of Admission  03/20/16  Date first seen by Palliative Care  03/21/16  # of days Palliative referral response time  0 Day(s)  # of days IP prior to Palliative referral  1  Clinical Assessment  Palliative Performance Scale Score  10%  Psychosocial & Spiritual Assessment  Palliative Care Outcomes      Patient Active Problem List   Diagnosis Date Noted  . Pressure injury of skin 03/23/2016  . Hypothermia   . HCAP (healthcare-associated pneumonia) 03/21/2016  . Leukocytosis 03/21/2016  . Acute respiratory failure with hypoxia ([place]) 03/20/2016  . Aspiration pneumonia ([place]) 03/20/2016  . Hypotension 03/20/2016  . Sepsis ([place]) 03/20/2016  . Respiratory distress   . Palliative care encounter 03/09/2015  .  DNR (do not resuscitate) 03/09/2015  . Dysphagia   . Acute encephalopathy   . UTI (lower urinary tract infection) 03/07/2015  . PSP (progressive supranuclear palsy) ([place]) 03/07/2015  . Parkinsonism ([place]) 03/07/2015  . Hyperlipidemia   . Apgar appearance score 1, pink body, bluish hands or feet 05/09/2014  . Cough 04/05/2014  . Dysphagia, pharyngoesophageal phase 10/25/2013  . Contracture of finger joint 07/25/2013  . Muscle spasticity 05/20/2013  . Mild cognitive impairment with memory loss 12/23/2012  . Encounter for long-term (current) use of other medications 12/22/2012  . Rhinitis, non-allergic   . Osteoarthritis of both knees   . Restless leg syndrome 08/04/2012  . Sleep disturbance, unspecified 06/23/2012  . Depressive disorder, not elsewhere classified 06/23/2012  . Essential hypertension   . Hip fracture ([place])   . Fracture of left hip ([place]) 07/03/2011  . Osteoporosis 07/03/2011  . Breast cancer ([place]) 07/03/2011  . Cancer ([place])   . Acid reflux   . Abnormality of gait 06/24/2007    Palliative Care Assessment & Plan   Patient Profile:  -83 year old female with progressive supranuclear palsy lives at home with  24/hr caregivers and hospice services.   Presented to the ED with acute respiratory distress placed on the BiPAP and now being treated for healthcare associated pneumonia versus aspiration pneumonia and sepsis.  -continued physical, functional decline over the past several years  -son focused on  treating treatable conditions, he has been providing meticulous nursing  care at home with 24/hr CNAs   Assessment:  -continued physical and functional decline 2/2 natural trajectory of supranuclear palsy  - significant dysphasia.  Surprisingly her albumin on 03-22-15 was 3.3.   I worry that with progression of disease patient will not be able to support herself nutritional for  very long.   - at this time PEG is not desired or  recommended  Recommendations/Plan:  Son request speech reassess swallow  I discussed with son that his caregivers are the very best at knowing and caring for this patient.  Patient will have the best opportunity for continued intake with these caregivers, with comfort feeds and  understanding the risks of aspiration       Code Status:    Code Status Orders        Start     Ordered   03/20/16 2138  Do not attempt resuscitation (DNR)  Continuous    Question Answer Comment  In the event of cardiac or respiratory ARREST Do not call a "code blue"   In the event of cardiac or respiratory ARREST Do not perform Intubation, CPR, defibrillation or ACLS   In the event of cardiac or respiratory ARREST Use medication by any route, position, wound care, and other measures to relive pain and suffering. May use oxygen, suction and manual treatment of airway obstruction as needed for comfort.      03/20/16 2137    Code Status History    Date Active Date Inactive Code Status Order ID Comments User Context   03/07/2015  5:14 PM 03/10/2015  9:06 PM DNR [id number]  Modena Jansky, MD Inpatient    Advance Directive Documentation   Flowsheet Row Most Recent Value  Type of Advance Directive  Out of facility DNR (pink MOST or yellow form)  Pre-existing out of facility DNR order (yellow form or pink MOST form)  Yellow form placed in chart (order not valid for inpatient use)  "MOST" Form in Place?  No data       Prognosis:  * less than six months  Discharge Planning:  Home with current caregivers and hospice services   Thank you for allowing the Palliative Medicine Team to assist in the care of this patient.   Time In: 1600 Time Out: 1635 Total Time 35 min Prolonged Time Billed  no      Greater than 50%  of this time was spent counseling and coordinating care related to the above assessment and plan.  Wadie Lessen, NP  Please contact Palliative Medicine Team phone at [phone] for  questions and concerns.

## 2016-03-26 NOTE — Progress Notes (Signed)
Nursing Note: Large incont. Void,Pt cleaned and turned.wbb

## 2016-03-27 DIAGNOSIS — Z66 Do not resuscitate: Secondary | ICD-10-CM

## 2016-03-27 DIAGNOSIS — J9600 Acute respiratory failure, unspecified whether with hypoxia or hypercapnia: Secondary | ICD-10-CM

## 2016-03-27 LAB — BASIC METABOLIC PANEL
Anion gap: 6 (ref 5–15)
BUN: 5 mg/dL — AB (ref 6–20)
CALCIUM: 8.2 mg/dL — AB (ref 8.9–10.3)
CO2: 25 mmol/L (ref 22–32)
CREATININE: 0.31 mg/dL — AB (ref 0.44–1.00)
Chloride: 107 mmol/L (ref 101–111)
GFR calc non Af Amer: >60 mL/min (ref >60)
GLUCOSE: 98 mg/dL (ref 65–99)
Potassium: 4.1 mmol/L (ref 3.5–5.1)
Sodium: 138 mmol/L (ref 135–145)

## 2016-03-27 MED ORDER — SCOPOLAMINE 1 MG/3DAYS TD PT72: 1.0000 | MEDICATED_PATCH | TRANSDERMAL | 12 refills | Status: AC

## 2016-03-27 NOTE — Progress Notes (Addendum)
[facility]2-[facility] [place]-G[facility] RN Visit  This is a related, covered GIP admission from 03/20/16 to [facility] diagnosis of progressive supranuclear palsy, per Dr. Karie Georges. Patient is a DNR. Patient was evaluated by hospice RN in the home yesterday, where patient was noted to have vitals WNL and in no acute distress. Approximately an hour after the nurses's departure, patient became pale and experienced labored breathing. Patient's son activated EMS, due to the acute change in her respiratory status and to seek further evaluation. Patient was admitted for acute respiratory failure likely secondary to an aspiration pneumonia and sepsis.  Visited patient in room, with caregiver at bedside and son, Aaron Edelman at bedside.  Patient appears to be resting comfortably.  She is currently on [place] with O2 and respirations WNL.  Per MD, patient is to receive one more day of IV antibiotics, then goal is for discharge tomorrow.  Spoke to [place], who denies any needs in the home prior to discharge.  He stated that speech therapy is working with the patient on introducing food/broth safely prior to discharge.  Plan is to visit with patient/caregivers in the morning to see if any needs arise before going home.  Patient will be transported via [facility].  Please call with any hospice-related questions or concerns.  Thank you, Freddi Starr RN, BSN [facility] Liaison [phone]  [facility] liaisons now live on [place].

## 2016-03-27 NOTE — Plan of Care (Addendum)
  Will continue to monitor pt

## 2016-03-27 NOTE — Discharge Summary (Signed)
Physician Discharge Summary  Isabella George BLT:[id number] DOB: [birth year] DOA: 03/20/2016  PCP: Hollace Kinnier, DO  Admit date: 03/20/2016 Discharge date: 03/28/16  Admitted From: Home Disposition:  Home  Recommendations for Outpatient Follow-up:  1. Follow up with PCP in 1-2 weeks 2. Please obtain BMP/CBC in one week   Home Health: home hospice services Equipment/Devices: 2 L nasal cannula  Discharge Condition: Stable  CODE STATUS:DNR Diet recommendation: comfort feeding with dysphagia 1 diet with nectar thickened liquids   Brief/Interim Summary: Patient is a 83 year old female with progressive supranuclear palsy with contractures was at home with hospice, debility and currently bedbound minimally verbal presented to the ED with acute respiratory distress placed on the BiPAP and being treated for healthcare associated pneumonia versus aspiration pneumonia and sepsis. Palliative care consulted and discussed with patient's son. Ultimately, the son was agreeable to comfort feeding type approach  Discharge Diagnoses:  acute respiratory failure with hypoxia likely secondary tohealthcare associated pneumonia versus aspiration pneumonia -Patient had presented with acute respiratory distress with increased work of breathing and was satting in the 70s on a nonrebreather on arrival to the ED. -weaned off BiPAP--stable on 2 L [place] -The patient had oxygen desaturation to 84% on room air -The patient will need to go home on supplemental oxygen--2L -Influenza PCR negative. RSV panel negative. -Patient was evaluated by speech therapy on numerous occasions -After extensive discussion with the patient's son, it was agreed upon to focus on comfort feedings only as the patient will continue to have high risk of repeated aspiration instructions were provided for dysphagia 1 diet with nectar thickened liquids with oral care prior to any oral intake.  Sepsis -secondary to healthcare associated  pneumonia/aspiration pneumonia. -initially with a worsening white count of 29.6( 03/21/2016)--->6.6  -WBC improved  -Lactic acid level trending down.  -Patient afebrile and was hypothermic earlier on. Tachycardia improved. Some improvement with respiratory rate.  -Blood cultures with 1/2 gram-positive cocci in chains/alloiococcus otitis,likely contaminant however still susceptible to Zosyn. -Urine cultures negative.  -DiscontinuedIV vancomycin.  -Continue IV Zosyn--finished 7 days during the hospitalization  hypotension -Improved with hydration.  -ContinueD51/2NS at 75 mL per hour, secondary to hypoglycemic episodes.  progressive supranuclear palsy Patient in contractures, and has been steady lead declining over the past year. Patient was on hospice at home. However son states patient was placed on hospice due to her bad urinary tract infection from last hospitalization.  leukocytosis Likelysecondary to healthcare associated pneumonia versus aspiration pneumonia.  -improved  gastroesophageal reflux disease PPI.  transient hypothermia -resolved  severe protein calorie malnutrition -Secondary to poor oral intake.  -patient has been seen in consultation by speech therapy were recommending nothing by mouth at this time.  -Palliative care following. May need to consider comfort feeds.  -scopolamine patch secondary to increased secretions.   hypokalemia/hypomagnesemia Repleted.  prognosis -Patient with a poor prognosis.  -Patient with progressive supranuclear palsy and currently with hospice at home presented with acute respiratory failure with hypoxia with concerns for aspiration pneumonia.  -Patient is currently a DO NOT RESUSCITATE.  -It has been explained to patient's son who is a healthcare power of attorney  -treat reversible things. If no significant improvement in the next few days, may need to consider transitioning to a full comfort measures. Palliative  care has assessed the patient and met with the family for goals of care.     Discharge Instructions   Allergies as of 03/27/2016      Reactions   Bee Venom Anaphylaxis  Adhesive [tape] Rash      Medication List    STOP taking these medications   baclofen 10 MG tablet Commonly known as:  LIORESAL   donepezil 5 MG tablet Commonly known as:  ARICEPT     TAKE these medications   acetaminophen 160 MG/5ML liquid Commonly known as:  TYLENOL Take 500 mg by mouth every 4 (four) hours as needed for pain.   aspirin 81 MG chewable tablet Chew 81 mg by mouth daily.   B COMPLEX-VITAMIN B12 PO Take 5 mLs by mouth daily.   bifidobacterium infantis capsule Take 1 capsule by mouth daily.   EPINEPHrine 0.3 mg/0.3 mL Soaj injection Commonly known as:  EPI-PEN Inject 0.3 mLs (0.3 mg total) into the muscle once. What changed:  when to take this  reasons to take this   HYDROcodone-acetaminophen 7.5-325 mg/15 ml solution Commonly known as:  HYCET Take 5 mLs by mouth 4 (four) times daily as needed for moderate pain.   influenza vac recom quadrivalent 0.5 ML injection Commonly known as:  FLUBLOK Inject 0.5 mLs into the muscle once.   ipratropium-albuterol 0.5-2.5 (3) MG/3ML Soln Commonly known as:  DUONEB Take 3 mLs by nebulization every 6 (six) hours as needed (congestion, wheezing, coughing). Dx acute bronchitis with bronchospasm   multivitamin Liqd Take 5 mLs by mouth 3 (three) times a week. No specific days of week   scopolamine 1 MG/3DAYS Commonly known as:  TRANSDERM-SCOP Place 1 patch (1.5 mg total) onto the skin every 3 (three) days. Start taking on:  03/28/2016   sorbitol 70 % solution Take 15 mLs by mouth daily as needed (for constipation).   THERAFLU COLD/COUGH DAYTIME PO Take 5 mLs by mouth every 6 (six) hours as needed (cold symptoms).   Vitamin C Powd Take 1,200 mg by mouth daily.   Vitamin D3 Liqd Take 1 drop by mouth daily. Reported on 03/16/2015              Durable Medical Equipment        Start     Ordered   03/27/16 1732  For home use only DME oxygen  Once    Question Answer Comment  Mode or (Route) Nasal cannula   Liters per Minute 2   Oxygen delivery system Gas      03/27/16 1731      Allergies  Allergen Reactions  . Bee Venom Anaphylaxis  . Adhesive [Tape] Rash    Consultations:  Palliative care   Procedures/Studies: Dg Chest Port 1 View  Result Date: 03/21/2016 CLINICAL DATA:  Respiratory failure EXAM: PORTABLE CHEST 1 VIEW COMPARISON:  March 20, 2016 FINDINGS: A small portion of the left base is not visualized. In the visualized regions, there is persistent patchy opacity in the right lower lobe, stable. Lungs elsewhere clear. Heart is mildly enlarged with pulmonary vascularity within normal limits. There is atherosclerotic calcification in the aorta. No pneumothorax. No adenopathy. There are surgical clips in the left axillary region. IMPRESSION: Note that a portion of the left base is not visualized. There is patchy infiltrate consistent with pneumonia in the right lower lobe, stable. No new opacity evident in regions visualized. Stable cardiac prominence. There is aortic atherosclerosis. Electronically Signed   By: Lowella Grip III M.D.   On: 03/21/2016 07:09   Dg Chest Port 1 View  Result Date: 03/20/2016 CLINICAL DATA:  Shortness of breath.  Unresponsive patient. EXAM: PORTABLE CHEST 1 VIEW COMPARISON:  03/08/2015 FINDINGS: The cardiac silhouette is mildly enlarged.  Mediastinal contours appear intact. There is no evidence of focal airspace consolidation, pleural effusion or pneumothorax. There is left lower lobe atelectasis. Osseous structures are without acute abnormality. Postsurgical changes in the left chest wall. Soft tissues are otherwise grossly normal. IMPRESSION: Stably enlarged cardiac silhouette, mild. Left lower lobe atelectasis. Electronically Signed   By: Fidela Salisbury M.D.   On:  03/20/2016 14:09        Discharge Exam: Vitals:   03/27/16 0505 03/27/16 1400  BP: 129/70 132/70  Pulse: 74 85  Resp: 16 17  Temp: 97.4 F (36.3 C) 97.7 F (36.5 C)   Vitals:   03/26/16 0954 03/26/16 1450 03/27/16 0505 03/27/16 1400  BP:  129/75 129/70 132/70  Pulse:  90 74 85  Resp:  16 16 17   Temp:  97.7 F (36.5 C) 97.4 F (36.3 C) 97.7 F (36.5 C)  TempSrc:  Axillary Oral Oral  SpO2: 97%   (!) 83%  Weight:      Height:        General: Pt is alert, awake, not in acute distress Cardiovascular: RRR, S1/S2 +, no rubs, no gallops Respiratory: CTA bilaterally, no wheezing, no rhonchi Abdominal: Soft, NT, ND, bowel sounds + Extremities: no edema, no cyanosis   The results of significant diagnostics from this hospitalization (including imaging, microbiology, ancillary and laboratory) are listed below for reference.    Significant Diagnostic Studies: Dg Chest Port 1 View  Result Date: 03/21/2016 CLINICAL DATA:  Respiratory failure EXAM: PORTABLE CHEST 1 VIEW COMPARISON:  March 20, 2016 FINDINGS: A small portion of the left base is not visualized. In the visualized regions, there is persistent patchy opacity in the right lower lobe, stable. Lungs elsewhere clear. Heart is mildly enlarged with pulmonary vascularity within normal limits. There is atherosclerotic calcification in the aorta. No pneumothorax. No adenopathy. There are surgical clips in the left axillary region. IMPRESSION: Note that a portion of the left base is not visualized. There is patchy infiltrate consistent with pneumonia in the right lower lobe, stable. No new opacity evident in regions visualized. Stable cardiac prominence. There is aortic atherosclerosis. Electronically Signed   By: Lowella Grip III M.D.   On: 03/21/2016 07:09   Dg Chest Port 1 View  Result Date: 03/20/2016 CLINICAL DATA:  Shortness of breath.  Unresponsive patient. EXAM: PORTABLE CHEST 1 VIEW COMPARISON:  03/08/2015 FINDINGS:  The cardiac silhouette is mildly enlarged. Mediastinal contours appear intact. There is no evidence of focal airspace consolidation, pleural effusion or pneumothorax. There is left lower lobe atelectasis. Osseous structures are without acute abnormality. Postsurgical changes in the left chest wall. Soft tissues are otherwise grossly normal. IMPRESSION: Stably enlarged cardiac silhouette, mild. Left lower lobe atelectasis. Electronically Signed   By: Fidela Salisbury M.D.   On: 03/20/2016 14:09     Microbiology: Recent Results (from the past 240 hour(s))  Culture, blood (Routine X 2) w Reflex to ID Panel     Status: Abnormal   Collection Time: 03/20/16  2:03 PM  Result Value Ref Range Status   Specimen Description BLOOD RIGHT ANTECUBITAL  Final   Special Requests BOTTLES DRAWN AEROBIC AND ANAEROBIC 5CC  Final   Culture  Setup Time   Final    GRAM POSITIVE COCCI IN CHAINS ANAEROBIC BOTTLE ONLY CRITICAL RESULT CALLED TO, READ BACK BY AND VERIFIED WITH: M LILLISTON,PHARMD AT 1302 03/22/16 BY L BENFIELD    Culture (A)  Final    ALLOIOCOCCUS OTITIS Standardized susceptibility testing for this organism is  not available. Performed at [facility]    Report Status 03/24/2016 FINAL  Final  Blood Culture ID Panel (Reflexed)     Status: None   Collection Time: 03/20/16  2:03 PM  Result Value Ref Range Status   Enterococcus species NOT DETECTED NOT DETECTED Final   Listeria monocytogenes NOT DETECTED NOT DETECTED Final   Staphylococcus species NOT DETECTED NOT DETECTED Final   Staphylococcus aureus NOT DETECTED NOT DETECTED Final   Streptococcus species NOT DETECTED NOT DETECTED Final   Streptococcus agalactiae NOT DETECTED NOT DETECTED Final   Streptococcus pneumoniae NOT DETECTED NOT DETECTED Final   Streptococcus pyogenes NOT DETECTED NOT DETECTED Final   Acinetobacter baumannii NOT DETECTED NOT DETECTED Final   Enterobacteriaceae species NOT DETECTED NOT DETECTED Final    Enterobacter cloacae complex NOT DETECTED NOT DETECTED Final   Escherichia coli NOT DETECTED NOT DETECTED Final   Klebsiella oxytoca NOT DETECTED NOT DETECTED Final   Klebsiella pneumoniae NOT DETECTED NOT DETECTED Final   Proteus species NOT DETECTED NOT DETECTED Final   Serratia marcescens NOT DETECTED NOT DETECTED Final   Haemophilus influenzae NOT DETECTED NOT DETECTED Final   Neisseria meningitidis NOT DETECTED NOT DETECTED Final   Pseudomonas aeruginosa NOT DETECTED NOT DETECTED Final   Candida albicans NOT DETECTED NOT DETECTED Final   Candida glabrata NOT DETECTED NOT DETECTED Final   Candida krusei NOT DETECTED NOT DETECTED Final   Candida parapsilosis NOT DETECTED NOT DETECTED Final   Candida tropicalis NOT DETECTED NOT DETECTED Final  Culture, blood (Routine X 2) w Reflex to ID Panel     Status: None   Collection Time: 03/20/16  2:24 PM  Result Value Ref Range Status   Specimen Description BLOOD LEFT HAND  Final   Special Requests IN PEDIATRIC BOTTLE 1CC  Final   Culture   Final    NO GROWTH 5 DAYS Performed at [facility], [address] [place], [place] [address]    Report Status 03/25/2016 FINAL  Final  Respiratory Panel by PCR     Status: None   Collection Time: 03/20/16  6:00 PM  Result Value Ref Range Status   Adenovirus NOT DETECTED NOT DETECTED Final   Coronavirus 229E NOT DETECTED NOT DETECTED Final   Coronavirus HKU1 NOT DETECTED NOT DETECTED Final   Coronavirus NL63 NOT DETECTED NOT DETECTED Final   Coronavirus OC43 NOT DETECTED NOT DETECTED Final   Metapneumovirus NOT DETECTED NOT DETECTED Final   Rhinovirus / Enterovirus NOT DETECTED NOT DETECTED Final   Influenza A NOT DETECTED NOT DETECTED Final   Influenza B NOT DETECTED NOT DETECTED Final   Parainfluenza Virus 1 NOT DETECTED NOT DETECTED Final   Parainfluenza Virus 2 NOT DETECTED NOT DETECTED Final   Parainfluenza Virus 3 NOT DETECTED NOT DETECTED Final   Parainfluenza Virus 4 NOT DETECTED  NOT DETECTED Final   Respiratory Syncytial Virus NOT DETECTED NOT DETECTED Final   Bordetella pertussis NOT DETECTED NOT DETECTED Final   Chlamydophila pneumoniae NOT DETECTED NOT DETECTED Final   Mycoplasma pneumoniae NOT DETECTED NOT DETECTED Final    Comment: Performed at [facility]  MRSA PCR Screening     Status: None   Collection Time: 03/20/16  6:00 PM  Result Value Ref Range Status   MRSA by PCR NEGATIVE NEGATIVE Final    Comment:        The GeneXpert MRSA Assay (FDA approved for NASAL specimens only), is one component of a comprehensive MRSA colonization surveillance program.  It is not intended to diagnose MRSA infection nor to guide or monitor treatment for MRSA infections.   Culture, Urine     Status: None   Collection Time: 03/20/16  9:40 PM  Result Value Ref Range Status   Specimen Description URINE, CATHETERIZED  Final   Special Requests NONE  Final   Culture NO GROWTH Performed at [facility]   Final   Report Status 03/22/2016 FINAL  Final     Labs: Basic Metabolic Panel:  Recent Labs Lab 03/20/16 2139  03/23/16 0312 03/24/16 0325 03/25/16 0407 03/26/16 0414 03/27/16 0400  NA  --   < > 140 143 140 138 138  K  --   < > 3.5 3.9 2.9* 3.5 4.1  CL  --   < > 111 113* 110 109 107  CO2  --   < > 21* 21* 25 26 25   GLUCOSE  --   < > 65 57* 122* 118* 98  BUN  --   < > 29* 27* 19 10 5*  CREATININE  --   < > 0.36* 0.44 0.39* <0.30* 0.31*  CALCIUM  --   < > 8.3* 8.0* 7.8* 7.6* 8.2*  MG 1.8  --   --   --  1.6* 2.0  --   < > = values in this interval not displayed. Liver Function Tests:  Recent Labs Lab 03/21/16 0326  AST 32  ALT 30  ALKPHOS 68  BILITOT 0.9  PROT 5.9*  ALBUMIN 3.3*   No results for input(s): LIPASE, AMYLASE in the last 168 hours. No results for input(s): AMMONIA in the last 168 hours. CBC:  Recent Labs Lab 03/21/16 0326 03/22/16 0352 03/23/16 0312 03/24/16 0325 03/25/16 0407  WBC 29.6* 20.6* 15.5* 9.0 6.6    NEUTROABS 27.5* 18.4*  --  7.5 4.9  HGB 12.6 11.0* 12.3 11.8* 12.2  HCT 37.5 32.9* 35.2* 34.4* 36.2  MCV 93.3 92.4 93.9 94.2 93.5  PLT 256 210 212 170 228   Cardiac Enzymes: No results for input(s): CKTOTAL, CKMB, CKMBINDEX, TROPONINI in the last 168 hours. BNP: Invalid input(s): POCBNP CBG:  Recent Labs Lab 03/24/16 0535 03/24/16 0608  GLUCAP 57* 120*    Time coordinating discharge:  Greater than 30 minutes  Signed:  Kafi Dotter, DO [facility] Pager: [phone] 03/27/2016, 5:46 PM

## 2016-03-27 NOTE — Progress Notes (Signed)
Pharmacy Antibiotic Note  Isabella George is a 83 y.o. female admitted on 03/20/2016 with sepsis likely secondary to HCAP vs aspiration pneumonia.  Pharmacy has been consulted for Zosyn dosing. Vancomycin discontinued after 3 days of treatment.    Today is day #8 of total antibiotics.   Plan: Zosyn 3.375g IV q8h (4 hour infusion).  Recommend stopping today   Height: 5' 8.9" (175 cm) Weight: 91 lb 0.8 oz (41.3 kg) IBW/kg (Calculated) : 65.97  Temp (24hrs), Avg:97.6 F (36.4 C), Min:97.4 F (36.3 C), Max:97.7 F (36.5 C)   Recent Labs Lab 03/20/16 1413 03/20/16 2137 03/21/16 0326 03/21/16 0751 03/22/16 0352 03/22/16 0805 03/23/16 0312 03/24/16 0325 03/25/16 0407 03/26/16 0414 03/27/16 0400  WBC  --   --  29.6*  --  20.6*  --  15.5* 9.0 6.6  --   --   CREATININE  --   --  0.55  --  0.40*  --  0.36* 0.44 0.39* <0.30* 0.31*  LATICACIDVEN 2.83* 3.5*  --  2.2*  --  0.9  --   --   --   --   --     Estimated Creatinine Clearance: 36 mL/min (by C-G formula based on SCr of 0.31 mg/dL (L)).    Allergies  Allergen Reactions  . Bee Venom Anaphylaxis  . Adhesive [Tape] Rash    Antimicrobials this admission:  1/12 Vanc >> 1/15 1/11 Cefepime >> 1/12 1/12 Zosyn >>  Dose adjustments this admission:   Microbiology results:  1/11 BCx x2: +GPC chains (Alloiococcus otitis) 1/2, BCID no detection 1/11 UCx: ngf 1/11 Sputum: sent  1/11 Influenza panel: negative 1/11 MRSA PCR: negative 1/11 Strep UA: negative  Thank you for allowing pharmacy to be a part of this patient's care.  Peggyann Juba, PharmD, BCPS Pager: [phone] 03/27/2016 7:54 AM

## 2016-03-27 NOTE — Progress Notes (Signed)
PROGRESS NOTE  COLANDRA OHANIAN LHT:[id number] DOB: 19-Jul-1932 DOA: 03/20/2016 PCP: Hollace Kinnier, DO  Brief History:  Patient is a 83 year old female with progressive supranuclear palsy with contractures was at home with hospice, debility and currently bedbound minimally verbal presented to the ED with acute respiratory distress placed on the BiPAP and being treated for healthcare associated pneumonia versus aspiration pneumonia and sepsis. Palliative care consulted and discussed with patient's son. Ultimately, the son was agreeable to comfort feeding type approach.  Assessment/Plan: acute respiratory failure with hypoxia likely secondary tohealthcare associated pneumonia versus aspiration pneumonia -Patient had presented with acute respiratory distress with increased work of breathing and was satting in the 70s on a nonrebreather on arrival to the ED. -weaned off BiPAP--stable on 2 L [place] -The patient had oxygen desaturation to 84% on room air -The patient will need to go home on supplemental oxygen - Influenza PCR negative. RSV panel negative. -Patient was evaluated by speech therapy on numerous occasions -After extensive discussion with the patient's son, it was agreed upon to focus on comfort feedings only as the patient will continue to have high risk of repeated aspiration instructions were provided for dysphagia 1 diet with nectar thickened liquids with oral care prior to any oral intake.  Sepsis -secondary to healthcare associated pneumonia/aspiration pneumonia. -initially with a worsening white count of 29.6( 03/21/2016).  -WBC improved  -Lactic acid level trending down.  -Patient afebrile and was hypothermic earlier on. Tachycardia improved. Some improvement with respiratory rate.  -Blood cultures with 1/2 gram-positive cocci in chains/alloiococcus otitis,likely contaminant however still susceptible to Zosyn. -Urine cultures negative.  -DiscontinuedIV vancomycin.    -Continue IV Zosyn--finished 7 days  hypotension -Improved with hydration.  -ContinueD51/2NS at 75 mL per hour, secondary to hypoglycemic episodes.  progressive supranuclear palsy Patient in contractures, and has been steady lead declining over the past year. Patient was on hospice at home. However son states patient was placed on hospice due to her bad urinary tract infection from last hospitalization.  leukocytosis Likelysecondary to healthcare associated pneumonia versus aspiration pneumonia.  -improved  gastroesophageal reflux disease PPI.  transient hypothermia -resolved  severe protein calorie malnutrition -Secondary to poor oral intake.  -patient has been seen in consultation by speech therapy were recommending nothing by mouth at this time.  -Palliative care following. May need to consider comfort feeds.   -scopolamine patch secondary to increased secretions.   hypokalemia/hypomagnesemia Repleted.  prognosis -Patient with a poor prognosis.  -Patient with progressive supranuclear palsy and currently with hospice at home presented with acute respiratory failure with hypoxia with concerns for aspiration pneumonia.  -Patient is currently a DO NOT RESUSCITATE.  -It has been explained to patient's son who is a healthcare power of attorney that it is uncertain how well patient will do during this hospitalization - treat reversible things. If no significant improvement in the next few days, may need to consider transitioning to a full comfort measures. Palliative care has assessed the patient and met with the family for goals of care.    Disposition Plan:   Home 03/27/16 Family Communication:   Caregivers updated at bedside  Consultants:  none  Code Status: DNR  DVT Prophylaxis:  [place] Lovenox   Procedures: As Listed in Progress Note Above  Antibiotics: Zosyn 03/21/16>>>    Subjective: Patient opens eyes to voice command. She does not speak. No  reports of respiratory distress, uncontrolled pain, vomiting, diarrhea.  Objective: Vitals:  03/26/16 0951 03/26/16 0954 03/26/16 1450 03/27/16 0505  BP:   129/75 129/70  Pulse:   90 74  Resp:   16 16  Temp:   97.7 F (36.5 C) 97.4 F (36.3 C)  TempSrc:   Axillary Oral  SpO2: 97% 97%    Weight:      Height:        Intake/Output Summary (Last 24 hours) at 03/27/16 1555 Last data filed at 03/27/16 0600  Gross per 24 hour  Intake             3185 ml  Output                0 ml  Net             3185 ml   Weight change:  Exam:   General:  Pt is alert, follows commands appropriately, not in acute distress  HEENT: No icterus, No thrush, No neck mass, [place]/AT  Cardiovascular: RRR, S1/S2, no rubs, no gallops  Respiratory: Bibasilar rales. Good air movement. No wheezing.  Abdomen: Soft/+BS, non tender, non distended, no guarding  Extremities: No edema, No lymphangitis, No petechiae, No rashes, no synovitis   Data Reviewed: I have personally reviewed following labs and imaging studies Basic Metabolic Panel:  Recent Labs Lab 03/20/16 2139  03/23/16 0312 03/24/16 0325 03/25/16 0407 03/26/16 0414 03/27/16 0400  NA  --   < > 140 143 140 138 138  K  --   < > 3.5 3.9 2.9* 3.5 4.1  CL  --   < > 111 113* 110 109 107  CO2  --   < > 21* 21* 25 26 25   GLUCOSE  --   < > 65 57* 122* 118* 98  BUN  --   < > 29* 27* 19 10 5*  CREATININE  --   < > 0.36* 0.44 0.39* <0.30* 0.31*  CALCIUM  --   < > 8.3* 8.0* 7.8* 7.6* 8.2*  MG 1.8  --   --   --  1.6* 2.0  --   < > = values in this interval not displayed. Liver Function Tests:  Recent Labs Lab 03/21/16 0326  AST 32  ALT 30  ALKPHOS 68  BILITOT 0.9  PROT 5.9*  ALBUMIN 3.3*   No results for input(s): LIPASE, AMYLASE in the last 168 hours. No results for input(s): AMMONIA in the last 168 hours. Coagulation Profile:  Recent Labs Lab 03/20/16 2137  INR 1.21   CBC:  Recent Labs Lab 03/21/16 0326 03/22/16 0352  03/23/16 0312 03/24/16 0325 03/25/16 0407  WBC 29.6* 20.6* 15.5* 9.0 6.6  NEUTROABS 27.5* 18.4*  --  7.5 4.9  HGB 12.6 11.0* 12.3 11.8* 12.2  HCT 37.5 32.9* 35.2* 34.4* 36.2  MCV 93.3 92.4 93.9 94.2 93.5  PLT 256 210 212 170 228   Cardiac Enzymes: No results for input(s): CKTOTAL, CKMB, CKMBINDEX, TROPONINI in the last 168 hours. BNP: Invalid input(s): POCBNP CBG:  Recent Labs Lab 03/24/16 0535 03/24/16 0608  GLUCAP 57* 120*   HbA1C: No results for input(s): HGBA1C in the last 72 hours. Urine analysis:    Component Value Date/Time   COLORURINE YELLOW 03/20/2016 2140   APPEARANCEUR CLOUDY (A) 03/20/2016 2140   LABSPEC 1.021 03/20/2016 2140   PHURINE 5.0 03/20/2016 2140   GLUCOSEU NEGATIVE 03/20/2016 2140   HGBUR NEGATIVE 03/20/2016 2140   BILIRUBINUR NEGATIVE 03/20/2016 2140   [place] 03/20/2016 2140   PROTEINUR 30 (A) 03/20/2016  2140   UROBILINOGEN 0.2 07/03/2011 1300   NITRITE NEGATIVE 03/20/2016 2140   LEUKOCYTESUR NEGATIVE 03/20/2016 2140   Sepsis Labs: @LABRCNTIP (procalcitonin:4,lacticidven:4) ) Recent Results (from the past 240 hour(s))  Culture, blood (Routine X 2) w Reflex to ID Panel     Status: Abnormal   Collection Time: 03/20/16  2:03 PM  Result Value Ref Range Status   Specimen Description BLOOD RIGHT ANTECUBITAL  Final   Special Requests BOTTLES DRAWN AEROBIC AND ANAEROBIC 5CC  Final   Culture  Setup Time   Final    GRAM POSITIVE COCCI IN CHAINS ANAEROBIC BOTTLE ONLY CRITICAL RESULT CALLED TO, READ BACK BY AND VERIFIED WITH: M LILLISTON,PHARMD AT 1302 03/22/16 BY L BENFIELD    Culture (A)  Final    ALLOIOCOCCUS OTITIS Standardized susceptibility testing for this organism is not available. Performed at [facility]    Report Status 03/24/2016 FINAL  Final  Blood Culture ID Panel (Reflexed)     Status: None   Collection Time: 03/20/16  2:03 PM  Result Value Ref Range Status   Enterococcus species NOT DETECTED NOT DETECTED  Final   Listeria monocytogenes NOT DETECTED NOT DETECTED Final   Staphylococcus species NOT DETECTED NOT DETECTED Final   Staphylococcus aureus NOT DETECTED NOT DETECTED Final   Streptococcus species NOT DETECTED NOT DETECTED Final   Streptococcus agalactiae NOT DETECTED NOT DETECTED Final   Streptococcus pneumoniae NOT DETECTED NOT DETECTED Final   Streptococcus pyogenes NOT DETECTED NOT DETECTED Final   Acinetobacter baumannii NOT DETECTED NOT DETECTED Final   Enterobacteriaceae species NOT DETECTED NOT DETECTED Final   Enterobacter cloacae complex NOT DETECTED NOT DETECTED Final   Escherichia coli NOT DETECTED NOT DETECTED Final   Klebsiella oxytoca NOT DETECTED NOT DETECTED Final   Klebsiella pneumoniae NOT DETECTED NOT DETECTED Final   Proteus species NOT DETECTED NOT DETECTED Final   Serratia marcescens NOT DETECTED NOT DETECTED Final   Haemophilus influenzae NOT DETECTED NOT DETECTED Final   Neisseria meningitidis NOT DETECTED NOT DETECTED Final   Pseudomonas aeruginosa NOT DETECTED NOT DETECTED Final   Candida albicans NOT DETECTED NOT DETECTED Final   Candida glabrata NOT DETECTED NOT DETECTED Final   Candida krusei NOT DETECTED NOT DETECTED Final   Candida parapsilosis NOT DETECTED NOT DETECTED Final   Candida tropicalis NOT DETECTED NOT DETECTED Final  Culture, blood (Routine X 2) w Reflex to ID Panel     Status: None   Collection Time: 03/20/16  2:24 PM  Result Value Ref Range Status   Specimen Description BLOOD LEFT HAND  Final   Special Requests IN PEDIATRIC BOTTLE 1CC  Final   Culture   Final    NO GROWTH 5 DAYS Performed at [facility], [address] [place], [place] [address]    Report Status 03/25/2016 FINAL  Final  Respiratory Panel by PCR     Status: None   Collection Time: 03/20/16  6:00 PM  Result Value Ref Range Status   Adenovirus NOT DETECTED NOT DETECTED Final   Coronavirus 229E NOT DETECTED NOT DETECTED Final   Coronavirus HKU1 NOT DETECTED  NOT DETECTED Final   Coronavirus NL63 NOT DETECTED NOT DETECTED Final   Coronavirus OC43 NOT DETECTED NOT DETECTED Final   Metapneumovirus NOT DETECTED NOT DETECTED Final   Rhinovirus / Enterovirus NOT DETECTED NOT DETECTED Final   Influenza A NOT DETECTED NOT DETECTED Final   Influenza B NOT DETECTED NOT DETECTED Final   Parainfluenza Virus 1 NOT DETECTED NOT  DETECTED Final   Parainfluenza Virus 2 NOT DETECTED NOT DETECTED Final   Parainfluenza Virus 3 NOT DETECTED NOT DETECTED Final   Parainfluenza Virus 4 NOT DETECTED NOT DETECTED Final   Respiratory Syncytial Virus NOT DETECTED NOT DETECTED Final   Bordetella pertussis NOT DETECTED NOT DETECTED Final   Chlamydophila pneumoniae NOT DETECTED NOT DETECTED Final   Mycoplasma pneumoniae NOT DETECTED NOT DETECTED Final    Comment: Performed at [facility]  MRSA PCR Screening     Status: None   Collection Time: 03/20/16  6:00 PM  Result Value Ref Range Status   MRSA by PCR NEGATIVE NEGATIVE Final    Comment:        The GeneXpert MRSA Assay (FDA approved for NASAL specimens only), is one component of a comprehensive MRSA colonization surveillance program. It is not intended to diagnose MRSA infection nor to guide or monitor treatment for MRSA infections.   Culture, Urine     Status: None   Collection Time: 03/20/16  9:40 PM  Result Value Ref Range Status   Specimen Description URINE, CATHETERIZED  Final   Special Requests NONE  Final   Culture NO GROWTH Performed at [facility]   Final   Report Status 03/22/2016 FINAL  Final     Scheduled Meds: . chlorhexidine  15 mL Mouth Rinse BID  . enoxaparin (LOVENOX) injection  30 mg Subcutaneous Q24H  . ipratropium-albuterol  3 mL Nebulization TID  . mouth rinse  15 mL Mouth Rinse q12n4p  . pantoprazole (PROTONIX) IV  40 mg Intravenous Q24H  . piperacillin-tazobactam (ZOSYN)  IV  3.375 g Intravenous Q8H  . scopolamine  1 patch Transdermal Q72H   Continuous  Infusions: . dextrose 5 % and 0.45 % NaCl with KCl 40 mEq/L 75 mL/hr at 03/27/16 1335    Procedures/Studies: Dg Chest Port 1 View  Result Date: 03/21/2016 CLINICAL DATA:  Respiratory failure EXAM: PORTABLE CHEST 1 VIEW COMPARISON:  March 20, 2016 FINDINGS: A small portion of the left base is not visualized. In the visualized regions, there is persistent patchy opacity in the right lower lobe, stable. Lungs elsewhere clear. Heart is mildly enlarged with pulmonary vascularity within normal limits. There is atherosclerotic calcification in the aorta. No pneumothorax. No adenopathy. There are surgical clips in the left axillary region. IMPRESSION: Note that a portion of the left base is not visualized. There is patchy infiltrate consistent with pneumonia in the right lower lobe, stable. No new opacity evident in regions visualized. Stable cardiac prominence. There is aortic atherosclerosis. Electronically Signed   By: Lowella Grip III M.D.   On: 03/21/2016 07:09   Dg Chest Port 1 View  Result Date: 03/20/2016 CLINICAL DATA:  Shortness of breath.  Unresponsive patient. EXAM: PORTABLE CHEST 1 VIEW COMPARISON:  03/08/2015 FINDINGS: The cardiac silhouette is mildly enlarged. Mediastinal contours appear intact. There is no evidence of focal airspace consolidation, pleural effusion or pneumothorax. There is left lower lobe atelectasis. Osseous structures are without acute abnormality. Postsurgical changes in the left chest wall. Soft tissues are otherwise grossly normal. IMPRESSION: Stably enlarged cardiac silhouette, mild. Left lower lobe atelectasis. Electronically Signed   By: Fidela Salisbury M.D.   On: 03/20/2016 14:09    Jarita Raval, DO  [facility] Pager [phone]  If 7PM-7AM, please contact night-coverage [email or web address] Password [email or web address] 03/27/2016, 3:55 PM   LOS: 7 days

## 2016-03-28 ENCOUNTER — Telehealth: Payer: Self-pay | Admitting: *Deleted

## 2016-03-28 NOTE — Progress Notes (Signed)
This CM was alerted that ambulance transport would be needed for discharge. Transport would be via [facility] due to pt being [facility] pt. This CM filled out Medical Necessity form and printed it along with demographic sheet. Forms left on front of chart with [facility] number for RN to call once son reports to RN that home 02 has been delivered. No other CM needs communicated. Marney Doctor RN,BSN,NCM [phone]

## 2016-03-28 NOTE — Telephone Encounter (Signed)
Son, Aaron Edelman called and stated that he needs an updated letter stating that his mother cannot handle her own affairs. Stated that the letter had to state patient's full name which is Isabella George and her DOB has to be typed in the letter, cannot be hand written. Please Advise.

## 2016-03-28 NOTE — Progress Notes (Signed)
Son  at bedside with caregivers. Discharge instructions reviewed. Caregiver is concerned about how many times per day patient is to be fed and diet type. Nurse spoke with Dr. Carles Collet who has had a conversation with son (Brian)previously about diet and meals. Family and caregiver made aware that patient should be NPO but son Aaron Edelman) has chosen to give comfort feeds. Son at bedside understands with no further questions.

## 2016-03-28 NOTE — Progress Notes (Signed)
[facility] MSW note: This is hospice related admission. Pt is a DNR. Hired sitters-Deida and Margreta Journey present with pt. Son -Aaron Edelman was not present. Pt was sitting up in her personal wheelchair. Pt slept during visit. Sitters reported that pt is talking more than she has since aspiration episode. Sitters reported that pt ate 3/4 cup of cream of chicken soup and few sips of cranberry juice today. Pt is now on oxygen 24 hours. Son is at home waiting for oxygen to be delivered. Pt will need non-emergency EMS to be transported home today. Discussed case with hired sitters. MSW discussed case with Adline Peals, staff RN as primary RN was at lunch. Pt scheduled to return home today with continued [facility] services.   Marilynne Halsted, MSW [phone]

## 2016-03-28 NOTE — Progress Notes (Signed)
Son Aaron Edelman) called to state that the O2 equipment has been delivered to the home. [facility] has ben notified for pickup.

## 2016-03-28 NOTE — Progress Notes (Signed)
Nursing Note: Oral care provided.wbb

## 2016-03-28 NOTE — Progress Notes (Signed)
Nursing Note: Report given.Pt assisted  to stretcher.Pt taken by EMS.Son at the bedside.wbb

## 2016-03-28 NOTE — Progress Notes (Signed)
[facility] RN Visit @ 10 AM  This is a related, covered GIP admission from 03/20/16 to [facility] diagnosis of progressive supranuclear palsy, per Dr. Karie Georges. Patient is a DNR. Patient was evaluated by hospice RN in the home yesterday, where patient was noted to have vitals WNL and in no acute distress. Approximately an hour after the nurses's departure, patient became pale and experienced labored breathing. Patient's son activated EMS, due to the acute change in her respiratory status and to seek further evaluation. Patient was admitted for acute respiratory failure likely secondary to an aspiration pneumonia and sepsis.  Visited patient in room, with caregiver at bedside. Patient appears to be resting comfortably.  She is currently on 2L  with O2 and respirations WNL. Per discussion with RN, patient has continued to use 2L and O2 ordered for delivery to the home, prior to discharge.  Patient will be transported via [facility].  Spoke to [place], patient's son who denied any questions or concerns.  He plans to wait at the home for the delivery of the oxygen.  Please call with any hospice-related questions or concerns.  Thank you, Freddi Starr RN, BSN [facility] Liaison [phone]  [facility] liaisons now live on [place].

## 2016-03-29 ENCOUNTER — Encounter: Payer: Self-pay | Admitting: Internal Medicine

## 2016-03-29 NOTE — Telephone Encounter (Signed)
Typed letter.  Please print out and I will sign it Monday for Aaron Edelman to give to the requesting entity.  Thx.

## 2016-03-31 ENCOUNTER — Telehealth: Payer: Self-pay

## 2016-03-31 NOTE — Telephone Encounter (Signed)
Letter printed and placed in Dr. Cyndi Lennert folder to review and sign.

## 2016-03-31 NOTE — Telephone Encounter (Signed)
Transition Care Management Follow-Up Telephone Call   Date discharged and where: [facility]; 03/28/16  How have you been since you were released from the hospital? Spoke to pt's son, Aaron Edelman, who stated that the report he received from the pt's CNA today was very concerning. CNA stated that pt has had diarrhea all day long, which has had a smell that she has not smelled before (despite being pt's CNA for years) and she believes it is coming out of the pt's vagina, not rectum.    Items Reviewed:   Meds:   Allergies:  Dietary Changes Reviewed:  Functional Questionnaire:  Independent-I Dependent-D  ADLs:   Dressing- D     Eating- Comfort feeds    Maintaining continence- D   Transferring-D   Transportation-D   Meal Prep- D   Managing Meds- D  Confirmed importance and Date/Time of follow-up visits scheduled: Per Dr. Mariea Clonts, pt's son to contact Hospice with new concerns and to keep [place] posted.    Confirmed with patient if condition worsens to call PCP or go to the Emergency Dept. Patient was given office number and encouraged to call back with questions or concerns: Darreld Mclean

## 2016-04-01 ENCOUNTER — Telehealth: Payer: Self-pay | Admitting: *Deleted

## 2016-04-01 NOTE — Telephone Encounter (Signed)
[name] with [facility] called and stated son is saying she is having stool coming out of her vagina. Nurse does not feel like it is a fistula. She did a vaginal exam. The nurse did not think it was coming out of vagina. The son and caregiver called them today and stated that it is definitely coming from the vagina and wants to take her to the ER. Hospice recommendation  To the son was to keep her clean and comfortable. The nurse would like your advise and wants to know if you will see her and do a vaginal exam or what is your options for her? Please Advise.

## 2016-04-02 NOTE — Telephone Encounter (Signed)
I am happy to see her and check for a fistula.  I agree with [name] that it is in Isabella George's best interest not to go to the ED for this.  Even if she does have a fistula, she is not a candidate for a surgical intervention for this.

## 2016-04-03 ENCOUNTER — Ambulatory Visit (INDEPENDENT_AMBULATORY_CARE_PROVIDER_SITE_OTHER): Payer: Medicare Other | Admitting: Nurse Practitioner

## 2016-04-03 VITALS — HR 62 | Temp 98.4°F

## 2016-04-03 DIAGNOSIS — A419 Sepsis, unspecified organism: Secondary | ICD-10-CM

## 2016-04-03 DIAGNOSIS — A09 Infectious gastroenteritis and colitis, unspecified: Secondary | ICD-10-CM | POA: Diagnosis not present

## 2016-04-03 DIAGNOSIS — N760 Acute vaginitis: Secondary | ICD-10-CM

## 2016-04-03 DIAGNOSIS — R197 Diarrhea, unspecified: Secondary | ICD-10-CM

## 2016-04-03 DIAGNOSIS — G231 Progressive supranuclear ophthalmoplegia [Steele-Richardson-Olszewski]: Secondary | ICD-10-CM | POA: Diagnosis not present

## 2016-04-03 DIAGNOSIS — B9689 Other specified bacterial agents as the cause of diseases classified elsewhere: Secondary | ICD-10-CM | POA: Diagnosis not present

## 2016-04-03 MED ORDER — METRONIDAZOLE 500 MG PO TABS: 500.0000 mg | ORAL_TABLET | Freq: Three times a day (TID) | ORAL | 0 refills | Status: DC

## 2016-04-03 NOTE — Telephone Encounter (Signed)
Spoke with Aaron Edelman, per Dr.Reed no major intervention will take place for a fistula and patient should be seen to make this determination. Appointment confirmed for 3:45 pm arrive at 3:30 for check in

## 2016-04-03 NOTE — Telephone Encounter (Signed)
I left message on voicemail for [name] with Dr.Reed's recommendations.   I called patient's son and offered 3:45 appointment today with Janett Billow, appointment scheduled.  Aaron Edelman would like Dr.Reed to clarify if this will be a beneficial appointment or not and if a fistula can be determined by a visual exam  Please advise

## 2016-04-03 NOTE — Telephone Encounter (Signed)
Chrae will you please take care of since I'm out of office. Thanks.

## 2016-04-03 NOTE — Progress Notes (Signed)
Careteam: Patient Care Team: Gayland Curry, DO as PCP - General (Geriatric Medicine) Lindwood Coke, MD as Consulting Physician (Dermatology) Bennetta Laos, MD (Inactive) as Consulting Physician (Obstetrics and Gynecology) Clent Jacks, MD as Consulting Physician (Ophthalmology) Lennon Alstrom, MD as Consulting Physician (Neurology) Melina Schools, MD as Consulting Physician (Orthopedic Surgery) [facility] Gaynelle Arabian, MD as Consulting Physician (Orthopedic Surgery)  Advanced Directive information    Allergies  Allergen Reactions  . Bee Venom Anaphylaxis  . Adhesive [Tape] Rash    Chief Complaint  Patient presents with  . Acute Visit    check for fisstula     HPI: Patient is a 83 y.o. female seen in the office today to follow up hospitalization and vaginal discharge with diarrhea.  Pt in hospital for 8 days due to aspiration Pnemonia. Has had diarrhea 1 week and 2 days.  Caregiver reports there is stool coming from her vaginal area. Flushed vaginal area out 4 days ago but still having stool and they feel like it was coming from vagina, caregiver reports she saw discharge from vagina while cleaning.  Also having sticking mucous from vaginal area.  No fevers.  Reports abdominal discomfort Caregivers report she is eating  Review of Systems:  Review of Systems  Unable to perform ROS: Dementia    Past Medical History:  Diagnosis Date  . Abnormality of gait   . Acid reflux   . Acute posthemorrhagic anemia 06/2011  . Allergic rhinitis due to pollen   . Anemia, unspecified   . Aneurysm of unspecified site 07/08/2011  . Cancer [facility])    Breast Cancer  . Chest pain, unspecified 02/10/2011  . Closed fracture of intertrochanteric section of femur ([place]) 07/07/2011  . Closed fracture of rib(s), unspecified 12/27/2010  . Closed fracture of two ribs 04/14/2011  . Contracture of finger joint 07/25/2013   Left 2,3,4 fingers  . Contusion of toe 05/26/2011    . Debility, unspecified 07/25/2011  . Depressive disorder, not elsewhere classified 2012  . Diaphragmatic hernia without mention of obstruction or gangrene 04/14/2011  . Disturbance of skin sensation 05/26/2011  . Dizziness and giddiness 06/23/2011  . Esophagitis, unspecified 04/14/2011  . Hip fracture ([place])   . Hyperlipidemia   . Loss of weight 04/14/2011  . Malignant neoplasm of breast (female), unspecified site 73   s/p left mastectomy  . Memory loss   . Osteoarthritis of both knees   . Osteoarthrosis, unspecified whether generalized or localized, unspecified site 12/17/2011  . Osteoporosis, senile   . Personal history of fall 06/23/2011  . Restless leg syndrome 08/04/2012  . Rhinitis, non-allergic   . Scoliosis (and kyphoscoliosis), idiopathic   . Sleep disturbance, unspecified   . Ulcer of esophagus 02/25/2011  . Unspecified essential hypertension   . Unspecified hereditary and idiopathic peripheral neuropathy 02/05/2011  . Unspecified urinary incontinence 12/27/2010  . Unspecified venous (peripheral) insufficiency 07/08/2011   Past Surgical History:  Procedure Laterality Date  . Anal fissuer    . ANAL FISSURE REPAIR    . APPENDECTOMY    . BREAST SURGERY  1986   Left mastectomy  . CATARACT EXTRACTION W/ INTRAOCULAR LENS IMPLANT  2000   right  . COLONOSCOPY W/ BIOPSIES  02/21/2011   polyp-hyperplastic polyp, no adenomatous change or malignancy Dr. Cristina Gong  . ESOPHAGOGASTRODUODENOSCOPY ENDOSCOPY  [id number]   Dr. Cristina Gong with biopsy gastroesophageal junction mucosa with focal ulceration no intestinal metaplasia, dysphasia, or malignancy. Does have a hiatal hernia with esophagitis  .  FEMUR IM NAIL  07/03/2011   Procedure: INTRAMEDULLARY (IM) NAIL FEMORAL;  Surgeon: Melina Schools, MD;  Location: [facility] ORS;  Service: Orthopedics;  Laterality: Left;  Synthes troch nail, jackson bed, c-arm  . HAND SURGERY    . HIP FRACTURE SURGERY    . ORIF ACETABULAR FRACTURE  07/03/2011   left hip Dr.  Rolena Infante  . TONGUE SURGERY    . TONGUE SURGERY  2000   lump on back of tongue Dr. Owens Shark  . TONSILLECTOMY    . VARICOSE VEIN SURGERY     Ligation of vein   Social History:   reports that she has never smoked. She has never used smokeless tobacco. She reports that she drinks alcohol. She reports that she does not use drugs.  Family History  Problem Relation Age of Onset  . Heart disease Mother   . Heart disease Brother   . Heart attack Brother   . Cancer Father     Liver cancer  . COPD Brother   . Deep vein thrombosis Brother   . Varicose Veins Brother   . Hyperlipidemia Son   . Diabetes Paternal Aunt     Medications: Patient's Medications  New Prescriptions   No medications on file  Previous Medications   ACETAMINOPHEN (TYLENOL) 160 MG/5ML LIQUID    Take 500 mg by mouth every 4 (four) hours as needed for pain.   ASCORBIC ACID (VITAMIN C) POWD    Take 1,200 mg by mouth daily.   ASPIRIN 81 MG CHEWABLE TABLET    Chew 81 mg by mouth daily.   B COMPLEX-FOLIC ACID (B COMPLEX-VITAMIN B12 PO)    Take 5 mLs by mouth daily.   BIFIDOBACTERIUM INFANTIS (ALIGN) CAPSULE    Take 1 capsule by mouth daily.   CHOLECALCIFEROL (VITAMIN D3) LIQD    Take 1 drop by mouth daily. Reported on 03/16/2015   EPINEPHRINE 0.3 MG/0.3 ML IJ SOAJ INJECTION    Inject 0.3 mLs (0.3 mg total) into the muscle once.   HYDROCODONE-ACETAMINOPHEN (HYCET) 7.5-325 MG/15 ML SOLUTION    Take 5 mLs by mouth 4 (four) times daily as needed for moderate pain.   IPRATROPIUM-ALBUTEROL (DUONEB) 0.5-2.5 (3) MG/3ML SOLN    Take 3 mLs by nebulization every 6 (six) hours as needed (congestion, wheezing, coughing). Dx acute bronchitis with bronchospasm   MULTIPLE VITAMIN (MULTIVITAMIN) LIQD    Take 5 mLs by mouth 3 (three) times a week. No specific days of week   PHENYLEPHRINE-DM (THERAFLU COLD/COUGH DAYTIME PO)    Take 5 mLs by mouth every 6 (six) hours as needed (cold symptoms).   SCOPOLAMINE (TRANSDERM-SCOP) 1 MG/3DAYS    Place 1 patch  (1.5 mg total) onto the skin every 3 (three) days.   SORBITOL 70 % SOLUTION    Take 15 mLs by mouth daily as needed (for constipation).  Modified Medications   No medications on file  Discontinued Medications   INFLUENZA VAC RECOM QUADRIVALENT (FLUBLOK) 0.5 ML INJECTION    Inject 0.5 mLs into the muscle once.     Physical Exam:  Vitals:   04/03/16 1559  Pulse: 62  Temp: 98.4 F (36.9 C)  TempSrc: Axillary   There is no height or weight on file to calculate BMI.  Physical Exam  Constitutional: No distress.  Frail thin female  Cardiovascular: Normal rate, regular rhythm, normal heart sounds and intact distal pulses.   Pulmonary/Chest: Effort normal and breath sounds normal.  Abdominal: Soft. Bowel sounds are normal.  Genitourinary: Vaginal discharge (  green, yellow thick discharge ) found.  Genitourinary Comments: Foul smelling, mucous stools  Musculoskeletal:  Contractures of extremities  Neurological: She is alert.  Skin: Skin is warm and dry.   Labs reviewed: Basic Metabolic Panel:  Recent Labs  03/20/16 2139  03/24/16 0951 03/25/16 0407 03/26/16 0414 03/27/16 0400  NA  --   < >  --  140 138 138  K  --   < >  --  2.9* 3.5 4.1  CL  --   < >  --  110 109 107  CO2  --   < >  --  25 26 25   GLUCOSE  --   < >  --  122* 118* 98  BUN  --   < >  --  19 10 5*  CREATININE  --   < >  --  0.39* <0.30* 0.31*  CALCIUM  --   < >  --  7.8* 7.6* 8.2*  MG 1.8  --   --  1.6* 2.0  --   TSH  --   --  0.795  --   --   --   < > = values in this interval not displayed. Liver Function Tests:  Recent Labs  03/20/16 1403 03/21/16 0326  AST 32 32  ALT 29 30  ALKPHOS 77 68  BILITOT 0.8 0.9  PROT 6.2* 5.9*  ALBUMIN 3.5 3.3*   No results for input(s): LIPASE, AMYLASE in the last 8760 hours. No results for input(s): AMMONIA in the last 8760 hours. CBC:  Recent Labs  03/22/16 0352 03/23/16 0312 03/24/16 0325 03/25/16 0407  WBC 20.6* 15.5* 9.0 6.6  NEUTROABS 18.4*  --  7.5  4.9  HGB 11.0* 12.3 11.8* 12.2  HCT 32.9* 35.2* 34.4* 36.2  MCV 92.4 93.9 94.2 93.5  PLT 210 212 170 228   Lipid Panel: No results for input(s): CHOL, HDL, LDLCALC, TRIG, CHOLHDL, LDLDIRECT in the last 8760 hours. TSH:  Recent Labs  03/24/16 0951  TSH 0.795   A1C: No results found for: HGBA1C   Assessment/Plan 1. BV (bacterial vaginosis) - metroNIDAZOLE (FLAGYL) 500 MG tablet; Take 1 tablet (500 mg total) by mouth 3 (three) times daily.  Dispense: 30 tablet; Refill: 0  2. Diarrhea of presumed infectious origin -will test for c diff due to recent antibiotic use during hospitalization.  -unable to obtain BMP or CBC today - metroNIDAZOLE (FLAGYL) 500 MG tablet; Take 1 tablet (500 mg total) by mouth 3 (three) times daily.  Dispense: 30 tablet; Refill: 0 - C. difficile, PCR  3. PSP (progressive supranuclear palsy) ([place]) With contractures, and has been steady lead declining over the past year. Patient remains on hospice at home  4. Sepsis, due to unspecified organism [facility]) Due to aspiration pneumonia. Completed IV zosyn during hospitalization. WBC were trending down in hospital.  Unable to obtain follow up CBC today in office -pt currently under hospice care  Jessica K. Harle Battiest  [facility] [phone] 8 am - 5 pm) [phone] (after hours)

## 2016-04-04 ENCOUNTER — Telehealth: Payer: Self-pay

## 2016-04-04 LAB — CLOSTRIDIUM DIFFICILE BY PCR: Toxigenic C. Difficile by PCR: DETECTED — CR

## 2016-04-04 NOTE — Telephone Encounter (Signed)
I called patient's son, Aaron Edelman, to let him know that patient's stool sample came back positive for C diff. After reviewing the lab result and office visit note from yesterday, Dr. Eulas Post determined that patient was already start on Flagyl for bacterial vaginosis.   Dr. Eulas Post stated that the Flagyl could be used to treat both infections.   Aaron Edelman verbalized understanding and stated that he would call the office if there were any concerns.

## 2016-04-08 ENCOUNTER — Telehealth: Payer: Self-pay | Admitting: *Deleted

## 2016-04-08 NOTE — Telephone Encounter (Signed)
[name] with Hospice notified and agreed.

## 2016-04-08 NOTE — Telephone Encounter (Signed)
[name] with Hospice called and stated that the son stated that we were unable to draw patient's labs at the appointment and stated that we wanted Hospice to draw. She stated that she has not received an order but fears if we could not get the draw they probably wouldn't be able to either. Please Advise.

## 2016-04-08 NOTE — Telephone Encounter (Signed)
Okay to attempt to get CBC and BMP, however pt is hospice so if they are unable to get the lab I would not want to put her through a lot more of sticks for this.

## 2016-04-14 ENCOUNTER — Other Ambulatory Visit: Payer: Self-pay

## 2016-04-14 ENCOUNTER — Telehealth: Payer: Self-pay | Admitting: *Deleted

## 2016-04-14 DIAGNOSIS — N760 Acute vaginitis: Principal | ICD-10-CM

## 2016-04-14 DIAGNOSIS — R197 Diarrhea, unspecified: Secondary | ICD-10-CM

## 2016-04-14 DIAGNOSIS — B9689 Other specified bacterial agents as the cause of diseases classified elsewhere: Secondary | ICD-10-CM

## 2016-04-14 MED ORDER — METRONIDAZOLE 500 MG PO TABS: 500.0000 mg | ORAL_TABLET | Freq: Three times a day (TID) | ORAL | 0 refills | Status: AC

## 2016-04-14 NOTE — Telephone Encounter (Signed)
Patient son, Isabella George called and stated that patient was seen by Janett Billow and she had prescribed Metronidazole. Son stated that patient missed 2 pills because they were destroyed because they were trying to find a food that they would taste ok in. Son wants two tablets called into pharmacy so patient can get her 30 days worth in. Son feels that it is important that she takes the full 30 days worth and would like 2 tablets sent to [facility]. Please Advise.   Janett Billow approved the replacement of the 2 tablets that were lost. Prescription for 2 tablets was sent to pharmacy. Patient's son was notified.

## 2016-04-14 NOTE — Telephone Encounter (Signed)
Patient son, Aaron Edelman called and stated that patient was seen by Janett Billow and she had prescribed Metronidazole. Son stated that patient missed 2 pills because they were destroyed because they were trying to find a food that they would taste ok in. Son wants two tablets called into pharmacy so patient can get her 30 days worth in. Son feels that it is important that she takes the full 30 days worth and would like 2 tablets sent to [facility]. Please Advise.

## 2016-04-14 NOTE — Telephone Encounter (Signed)
Two replacement tablets were sent to the pharmacy and patient's son was notified.

## 2016-04-14 NOTE — Telephone Encounter (Signed)
This is ok

## 2016-04-15 ENCOUNTER — Emergency Department (HOSPITAL_COMMUNITY)
Admission: EM | Admit: 2016-04-15 | Discharge: 2016-04-16 | Disposition: A | Discharge status: Home / Self Care | Attending: Emergency Medicine | Admitting: Emergency Medicine

## 2016-04-15 ENCOUNTER — Encounter (HOSPITAL_COMMUNITY): Payer: Self-pay

## 2016-04-15 ENCOUNTER — Emergency Department (HOSPITAL_COMMUNITY)

## 2016-04-15 DIAGNOSIS — J690 Pneumonitis due to inhalation of food and vomit: Secondary | ICD-10-CM

## 2016-04-15 DIAGNOSIS — Z7982 Long term (current) use of aspirin: Secondary | ICD-10-CM | POA: Diagnosis not present

## 2016-04-15 DIAGNOSIS — I10 Essential (primary) hypertension: Secondary | ICD-10-CM | POA: Diagnosis not present

## 2016-04-15 DIAGNOSIS — Z79899 Other long term (current) drug therapy: Secondary | ICD-10-CM | POA: Insufficient documentation

## 2016-04-15 DIAGNOSIS — Z853 Personal history of malignant neoplasm of breast: Secondary | ICD-10-CM | POA: Insufficient documentation

## 2016-04-15 DIAGNOSIS — G20 Parkinson's disease: Secondary | ICD-10-CM | POA: Diagnosis not present

## 2016-04-15 DIAGNOSIS — J181 Lobar pneumonia, unspecified organism: Secondary | ICD-10-CM | POA: Diagnosis not present

## 2016-04-15 DIAGNOSIS — R509 Fever, unspecified: Secondary | ICD-10-CM | POA: Diagnosis present

## 2016-04-15 LAB — COMPREHENSIVE METABOLIC PANEL
ALBUMIN: 2.7 g/dL — AB (ref 3.5–5.0)
ALK PHOS: 64 U/L (ref 38–126)
ALT: 34 U/L (ref 14–54)
ANION GAP: 6 (ref 5–15)
AST: 42 U/L — ABNORMAL HIGH (ref 15–41)
BUN: 19 mg/dL (ref 6–20)
CALCIUM: 8.4 mg/dL — AB (ref 8.9–10.3)
CHLORIDE: 104 mmol/L (ref 101–111)
CO2: 28 mmol/L (ref 22–32)
Creatinine, Ser: 0.37 mg/dL — ABNORMAL LOW (ref 0.44–1.00)
GFR calc Af Amer: >60 mL/min (ref >60)
GFR calc non Af Amer: >60 mL/min (ref >60)
GLUCOSE: 93 mg/dL (ref 65–99)
POTASSIUM: 3.5 mmol/L (ref 3.5–5.1)
SODIUM: 138 mmol/L (ref 135–145)
Total Bilirubin: 0.5 mg/dL (ref 0.3–1.2)
Total Protein: 5.2 g/dL — ABNORMAL LOW (ref 6.5–8.1)

## 2016-04-15 LAB — CBC WITH DIFFERENTIAL/PLATELET
Basophils Absolute: 0 10*3/uL (ref 0.0–0.1)
Basophils Relative: 0 %
Eosinophils Absolute: 0 10*3/uL (ref 0.0–0.7)
Eosinophils Relative: 0 %
HCT: 34.4 % — ABNORMAL LOW (ref 36.0–46.0)
Hemoglobin: 12.1 g/dL (ref 12.0–15.0)
LYMPHS ABS: 1.1 10*3/uL (ref 0.7–4.0)
LYMPHS PCT: 6 %
MCH: 32.2 pg (ref 26.0–34.0)
MCHC: 35.2 g/dL (ref 30.0–36.0)
MCV: 91.5 fL (ref 78.0–100.0)
MONOS PCT: 6 %
Monocytes Absolute: 1.1 10*3/uL — ABNORMAL HIGH (ref 0.1–1.0)
NEUTROS ABS: 18.5 10*3/uL — AB (ref 1.7–7.7)
Neutrophils Relative %: 88 %
Platelets: 190 10*3/uL (ref 150–400)
RBC: 3.76 MIL/uL — ABNORMAL LOW (ref 3.87–5.11)
RDW: 13.9 % (ref 11.5–15.5)
WBC: 20.8 10*3/uL — ABNORMAL HIGH (ref 4.0–10.5)

## 2016-04-15 LAB — I-STAT CG4 LACTIC ACID, ED: Lactic Acid, Venous: 0.63 mmol/L (ref 0.5–1.9)

## 2016-04-15 MED ORDER — CEFUROXIME AXETIL 250 MG/5ML PO SUSR: 500.0000 mg | Freq: Two times a day (BID) | ORAL | 1 refills | Status: AC

## 2016-04-15 MED ORDER — SODIUM CHLORIDE 0.9 % IV BOLUS (SEPSIS)
1000.0000 mL | Freq: Once | INTRAVENOUS | Status: AC
  Administered 2016-04-15: 1000 mL via INTRAVENOUS

## 2016-04-15 MED ORDER — LEVOFLOXACIN 25 MG/ML PO SOLN: 500.0000 mg | Freq: Once | ORAL | Status: DC

## 2016-04-15 MED ORDER — CEFUROXIME AXETIL 250 MG/5ML PO SUSR
500.0000 mg | Freq: Once | ORAL | Status: AC
  Administered 2016-04-15: 500 mg via ORAL
  Filled 2016-04-15: qty 10

## 2016-04-15 NOTE — Discharge Instructions (Signed)
As discussed, it is important that you follow-up with your hospice care provider tomorrow via telephone.

## 2016-04-15 NOTE — ED Notes (Signed)
PT DISCHARGED. INSTRUCTIONS AND PRESCRIPTION GIVEN. PT IN NO APPARENT DISTRESS OR PAIN. AWAITING [facility]AR FOR TRANSPORT. THE OPPORTUNITY TO ASK QUESTIONS WAS PROVIDED.

## 2016-04-15 NOTE — ED Triage Notes (Addendum)
PT RECEIVED FROM HOME VIA EMS FOR A FEVER. PER EMS, THE CAREGIVERS AHD A TEMP OF 102 AT 1000 THIS AM. THEY STS SHE HAD NO FEVER YESTERDAY OR LAST NIGHT. PT CURRENTLY BEING TREATED FOR C. DIFF, IN WHICH HER LAST PILL IS TO BE TAKEN TODAY. PT WAS DX'D ON 1/26.  PT IS A DNR.

## 2016-04-15 NOTE — ED Notes (Signed)
PER THE CAREGIVERS, THE PT WAS BROUGHT HERE DUE TO THEM NOTICING STOOL COMING FROM HER VAGINA AND FOR HER TO BE CHECKED FOR THE FLU. DR. Vanita Panda MADE AWARE.

## 2016-04-15 NOTE — ED Provider Notes (Addendum)
[facility] DEPT Provider Note   CSN: [id number] Arrival date & time: 04/15/16  1157     History   Chief Complaint Chief Complaint  Patient presents with  . Fever    HPI Isabella George is a 83 y.o. female.  HPI  Patient presents from home with 3 caregivers who provide history of present illness. Patient is nonverbal, has baseline minimal interactivity, history of supra nuclear palsy. Patient has recent evaluation, with diagnosis of C. difficile colitis, and is on her last day of Flagyl therapy. Caregivers report that today the patient had a temperature. They deny vomiting, substantial cough, notes that they continue to feed the patient liquids. Have some concern of possible vaginal discharge, dark. Specifically voiced concern of possible stool passing through the vaginal fold.  Level V caveat secondary to nonverbal status, mental status  Caregivers note patient is enrolled in hospice care, has not spoken with hospice providers today.    Past Medical History:  Diagnosis Date  . Abnormality of gait   . Acid reflux   . Acute posthemorrhagic anemia 06/2011  . Allergic rhinitis due to pollen   . Anemia, unspecified   . Aneurysm of unspecified site 07/08/2011  . Cancer [facility])    Breast Cancer  . Chest pain, unspecified 02/10/2011  . Closed fracture of intertrochanteric section of femur ([place]) 07/07/2011  . Closed fracture of rib(s), unspecified 12/27/2010  . Closed fracture of two ribs 04/14/2011  . Contracture of finger joint 07/25/2013   Left 2,3,4 fingers  . Contusion of toe 05/26/2011  . Debility, unspecified 07/25/2011  . Depressive disorder, not elsewhere classified 2012  . Diaphragmatic hernia without mention of obstruction or gangrene 04/14/2011  . Disturbance of skin sensation 05/26/2011  . Dizziness and giddiness 06/23/2011  . Esophagitis, unspecified 04/14/2011  . Hip fracture ([place])   . Hyperlipidemia   . Loss of weight 04/14/2011  . Malignant neoplasm of breast  (female), unspecified site 31   s/p left mastectomy  . Memory loss   . Osteoarthritis of both knees   . Osteoarthrosis, unspecified whether generalized or localized, unspecified site 12/17/2011  . Osteoporosis, senile   . Personal history of fall 06/23/2011  . Restless leg syndrome 08/04/2012  . Rhinitis, non-allergic   . Scoliosis (and kyphoscoliosis), idiopathic   . Sleep disturbance, unspecified   . Ulcer of esophagus 02/25/2011  . Unspecified essential hypertension   . Unspecified hereditary and idiopathic peripheral neuropathy 02/05/2011  . Unspecified urinary incontinence 12/27/2010  . Unspecified venous (peripheral) insufficiency 07/08/2011    Patient Active Problem List   Diagnosis Date Noted  . Palliative care by specialist   . Pressure injury of skin 03/23/2016  . Hypothermia   . HCAP (healthcare-associated pneumonia) 03/21/2016  . Leukocytosis 03/21/2016  . Acute respiratory failure with hypoxia ([place]) 03/20/2016  . Aspiration pneumonia ([place]) 03/20/2016  . Hypotension 03/20/2016  . Sepsis ([place]) 03/20/2016  . Respiratory distress   . Palliative care encounter 03/09/2015  . DNR (do not resuscitate) 03/09/2015  . Dysphagia   . Acute encephalopathy   . UTI (lower urinary tract infection) 03/07/2015  . PSP (progressive supranuclear palsy) ([place]) 03/07/2015  . Parkinsonism ([place]) 03/07/2015  . Hyperlipidemia   . Apgar appearance score 1, pink body, bluish hands or feet 05/09/2014  . Cough 04/05/2014  . Dysphagia, pharyngoesophageal phase 10/25/2013  . Contracture of finger joint 07/25/2013  . Muscle spasticity 05/20/2013  . Mild cognitive impairment with memory loss 12/23/2012  . Encounter for long-term (current) use  of other medications 12/22/2012  . Rhinitis, non-allergic   . Osteoarthritis of both knees   . Restless leg syndrome 08/04/2012  . Sleep disturbance, unspecified 06/23/2012  . Depressive disorder, not elsewhere classified 06/23/2012  . Essential  hypertension   . Hip fracture ([place])   . Fracture of left hip ([place]) 07/03/2011  . Osteoporosis 07/03/2011  . Breast cancer ([place]) 07/03/2011  . Cancer ([place])   . Acid reflux   . Abnormality of gait 06/24/2007    Past Surgical History:  Procedure Laterality Date  . Anal fissuer    . ANAL FISSURE REPAIR    . APPENDECTOMY    . BREAST SURGERY  1986   Left mastectomy  . CATARACT EXTRACTION W/ INTRAOCULAR LENS IMPLANT  2000   right  . COLONOSCOPY W/ BIOPSIES  02/21/2011   polyp-hyperplastic polyp, no adenomatous change or malignancy Dr. Cristina Gong  . ESOPHAGOGASTRODUODENOSCOPY ENDOSCOPY  [id number]   Dr. Cristina Gong with biopsy gastroesophageal junction mucosa with focal ulceration no intestinal metaplasia, dysphasia, or malignancy. Does have a hiatal hernia with esophagitis  . FEMUR IM NAIL  07/03/2011   Procedure: INTRAMEDULLARY (IM) NAIL FEMORAL;  Surgeon: Melina Schools, MD;  Location: [facility] ORS;  Service: Orthopedics;  Laterality: Left;  Synthes troch nail, jackson bed, c-arm  . HAND SURGERY    . HIP FRACTURE SURGERY    . ORIF ACETABULAR FRACTURE  07/03/2011   left hip Dr. Rolena Infante  . TONGUE SURGERY    . TONGUE SURGERY  2000   lump on back of tongue Dr. Owens Shark  . TONSILLECTOMY    . VARICOSE VEIN SURGERY     Ligation of vein    OB History    Gravida Para Term Preterm AB Living   3 2 2   1 2    SAB TAB Ectopic Multiple Live Births                   Home Medications    Prior to Admission medications   Medication Sig Start Date End Date Taking? Authorizing Provider  acetaminophen (TYLENOL) 160 MG/5ML liquid Take 500 mg by mouth every 4 (four) hours as needed for pain.   Yes Historical Provider, MD  Ascorbic Acid (VITAMIN C) POWD Take 1,200 mg by mouth daily.   Yes Historical Provider, MD  aspirin 81 MG chewable tablet Chew 81 mg by mouth daily.   Yes Historical Provider, MD  B Complex-Folic Acid (B COMPLEX-VITAMIN B12 PO) Take 5 mLs by mouth daily.   Yes Historical Provider, MD    bifidobacterium infantis (ALIGN) capsule Take 1 capsule by mouth 2 (two) times daily.    Yes Historical Provider, MD  Cholecalciferol (VITAMIN D3) LIQD Take 1 drop by mouth daily. Reported on 03/16/2015   Yes Historical Provider, MD  HYDROcodone-acetaminophen (HYCET) 7.5-325 mg/15 ml solution Take 5 mLs by mouth 4 (four) times daily as needed for moderate pain. 01/17/16 01/16/17 Yes Dorie Rank, MD  ipratropium-albuterol (DUONEB) 0.5-2.5 (3) MG/3ML SOLN Take 3 mLs by nebulization every 6 (six) hours as needed (congestion, wheezing, coughing). Dx acute bronchitis with bronchospasm 01/31/15  Yes Tiffany L Reed, DO  metroNIDAZOLE (FLAGYL) 500 MG tablet Take 1 tablet (500 mg total) by mouth 3 (three) times daily. 04/14/16  Yes Lauree Chandler, NP  Multiple Vitamin (MULTIVITAMIN) LIQD Take 5 mLs by mouth 3 (three) times a week. No specific days of week   Yes Historical Provider, MD  scopolamine (TRANSDERM-SCOP) 1 MG/3DAYS Place 1 patch (1.5 mg total) onto the skin  every 3 (three) days. 03/28/16  Yes Orson Eva, MD  sorbitol 70 % solution Take 15 mLs by mouth daily as needed (for constipation).   Yes Historical Provider, MD  EPINEPHrine 0.3 mg/0.3 mL IJ SOAJ injection Inject 0.3 mLs (0.3 mg total) into the muscle once. Patient taking differently: Inject 0.3 mg into the muscle once as needed (allergies).  10/11/14   Gayland Curry, DO    Family History Family History  Problem Relation Age of Onset  . Heart disease Mother   . Heart disease Brother   . Heart attack Brother   . Cancer Father     Liver cancer  . COPD Brother   . Deep vein thrombosis Brother   . Varicose Veins Brother   . Hyperlipidemia Son   . Diabetes Paternal Aunt     Social History Social History  Substance Use Topics  . Smoking status: Never Smoker  . Smokeless tobacco: Never Used  . Alcohol use 0.0 oz/week     Comment: rare     Allergies   Bee venom and Adhesive [tape]   Review of Systems Review of Systems  Unable to  perform ROS: Patient nonverbal     Physical Exam Updated Vital Signs BP 110/69 (BP Location: Right Arm)   Pulse 85   Temp 99.3 F (37.4 C) (Rectal)   Resp 20   Ht 5\' 3"  (1.6 m)   Wt 91 lb (41.3 kg)   SpO2 100%   BMI 16.12 kg/m   Physical Exam  Constitutional: She appears listless. She has a sickly appearance.  Listless, noninteractive, nonverbal  HENT:  Head: Normocephalic and atraumatic.  Eyes: Right eye exhibits no discharge. Left eye exhibits no discharge.  Neck: No tracheal deviation present.  Cardiovascular: Normal rate and regular rhythm.   Pulmonary/Chest: No stridor.  Diminished breath sounds throughout, minimal respiratory effort  Abdominal:  Patient does not respond to abdominal exam, the abdomen is soft, with no distention  Genitourinary:  Genitourinary Comments: No evidence of stool, no discharge in the vaginal vault  Musculoskeletal:  Left arm in a previously applied splint, no evidence for new deformity, the patient has contractures in all her extremity is, making complete exam difficult.   Neurological: She appears listless. She displays atrophy. She exhibits abnormal muscle tone. She displays no seizure activity.  Patient does not cooperate, communicate, no interaction during the exam.  Psychiatric: Cognition and memory are impaired.     ED Treatments / Results  Labs (all labs ordered are listed, but only abnormal results are displayed) Labs Reviewed  COMPREHENSIVE METABOLIC PANEL - Abnormal; Notable for the following:       Result Value   Creatinine, Ser 0.37 (*)    Calcium 8.4 (*)    Total Protein 5.2 (*)    Albumin 2.7 (*)    AST 42 (*)    All other components within normal limits  CBC WITH DIFFERENTIAL/PLATELET - Abnormal; Notable for the following:    WBC 20.8 (*)    RBC 3.76 (*)    HCT 34.4 (*)    Neutro Abs 18.5 (*)    Monocytes Absolute 1.1 (*)    All other components within normal limits  I-STAT CG4 LACTIC ACID, ED     Radiology Dg Chest 2 View  Result Date: 04/15/2016 CLINICAL DATA:  Shortness of Breath EXAM: CHEST  2 VIEW COMPARISON:  March 21, 2016 FINDINGS: There is airspace consolidation in both lower lobes, more on the left than on the  right with small left pleural effusion. Lungs elsewhere are clear. Heart is upper normal in size with pulmonary vascularity within normal limits. No adenopathy. There is postoperative change in the left axillary region. Patient has had previous mastectomy on the left. Bones are osteoporotic. No blastic or lytic bone lesions. There is atherosclerotic calcification aorta. IMPRESSION: Airspace consolidation in both lower lobes, more on the left than on the right with small left pleural effusion. Stable cardiac silhouette. There is aortic atherosclerosis. Electronically Signed   By: Lowella Grip III M.D.   On: 04/15/2016 14:01    Procedures Procedures (including critical care time)    Initial Impression / Assessment and Plan / ED Course  I have reviewed the triage vital signs and the nursing notes.  Pertinent labs & imaging results that were available during my care of the patient were reviewed by me and considered in my medical decision making (see chart for details).   After the initial evaluation I discussed the initial results with the caregivers. As reviewed the patient's name, discussed her presentation with hospice, and palliative care. Initial results notable for leukocytosis. X-ray contrast pneumonia.  Patient's recent discharge summary indicated that additional feeding attempts for this patient would likely result in aspiration pneumonia, and that is a consideration today. We discussed this at length, and had initial discussions of goals of care.  Patient's son is not present, but hospice has contacted him.  5:38 PM I had a very lengthy conversation with the son about the patient, her presentation, goals of care. With consideration of maintaining  quality of life, as it is, patient will be discharged home with continued oral feeds, and oral antibiotics. Family will contact hospice tomorrow for update.   Final Clinical Impressions(s) / ED Diagnoses  Pneumonia Listlessness  This elderly female presents from home with multiple caregivers due to concern of fever. Patient has multiple medical issues, his baseline nonverbal, listless, has progressive neurologic degenerative condition. Patient is enrolled in hospice, is DO NOT RESUSCITATE. With effort to balance goals of care, potential for overcoming this infectious illness, patient will receive oral antibiotics, be discharged home with close hospice follow-up.   Carmin Muskrat, MD 04/15/16 1745  Family requests IVF prior to d/c  I discussed ABX w pharmacy   Carmin Muskrat, MD 04/15/16 [phone]

## 2016-04-15 NOTE — ED Notes (Signed)
BLOOD CULTURE #1 DRAWN.

## 2016-04-15 NOTE — ED Notes (Signed)
Bed: WA09 Expected date:  Expected time:  Means of arrival:  Comments: EMS-[facility]TT

## 2016-04-16 NOTE — ED Notes (Signed)
[facility] here to transport pt back to pt's home/residence.

## 2016-04-18 ENCOUNTER — Telehealth: Payer: Self-pay

## 2016-04-18 NOTE — Telephone Encounter (Signed)
Transition Care Management Follow-Up Telephone Call   Date discharged and where: [facility]; 04/16/16  How have you been since you were released from the hospital? Spoke to pt's son, Aaron Edelman, who states his mother is doing much better than previously. He was told she was having another bout of aspirated pneumonia with fever. He states she is still having the vaginal discharge and pt's caretakers are still saying she is having poop come out of her vagina. Plans on getting her to a GYN once she is cleared from this recent pneumonia. Hospice remains close at hand, along with pt's caretakers, around the clock.   Any patient concerns? N/A  Items Reviewed:   Meds:   Allergies:  Dietary Changes Reviewed:  Functional Questionnaire:  Independent-I Dependent-D  ADLs:   Dressing- D   Eating-D   Maintaining continence-D   Transferring-D   Transportation-D   Meal Prep-D   Managing Meds- D  Confirmed importance and Date/Time of follow-up visits scheduled: None scheduled at this time as per Hospice is monitoring patient daily.    Confirmed with patient if condition worsens to call PCP or go to the Emergency Dept. Patient was given office number and encouraged to call back with questions or concerns: Darreld Mclean

## 2016-05-08 DEATH — deceased

## 2017-01-02 ENCOUNTER — Telehealth: Payer: Self-pay | Admitting: Neurology

## 2017-01-05 NOTE — Telephone Encounter (Signed)
error 

## 2017-02-04 ENCOUNTER — Encounter: Payer: Self-pay | Admitting: *Deleted

## 2017-02-18 NOTE — Progress Notes (Signed)
Done

## 2017-12-07 ENCOUNTER — Encounter: Payer: Self-pay | Admitting: Internal Medicine

## 2018-01-26 IMAGING — CR DG ELBOW 2V*L*
3 series · 3 of 3 positions shown · non-contrast
Comparison: None.

CLINICAL DATA: Left humerus and elbow pain. Pt's son reports that
pt has therapy to help with contracted extremities - today there was
an audible pop when moving left arm. Best images sent. Tech held
patient during imaging. Unable to extend arm for AP view.

EXAM:
LEFT ELBOW - 2 VIEW

[x elbow lat left (1 of 3)]
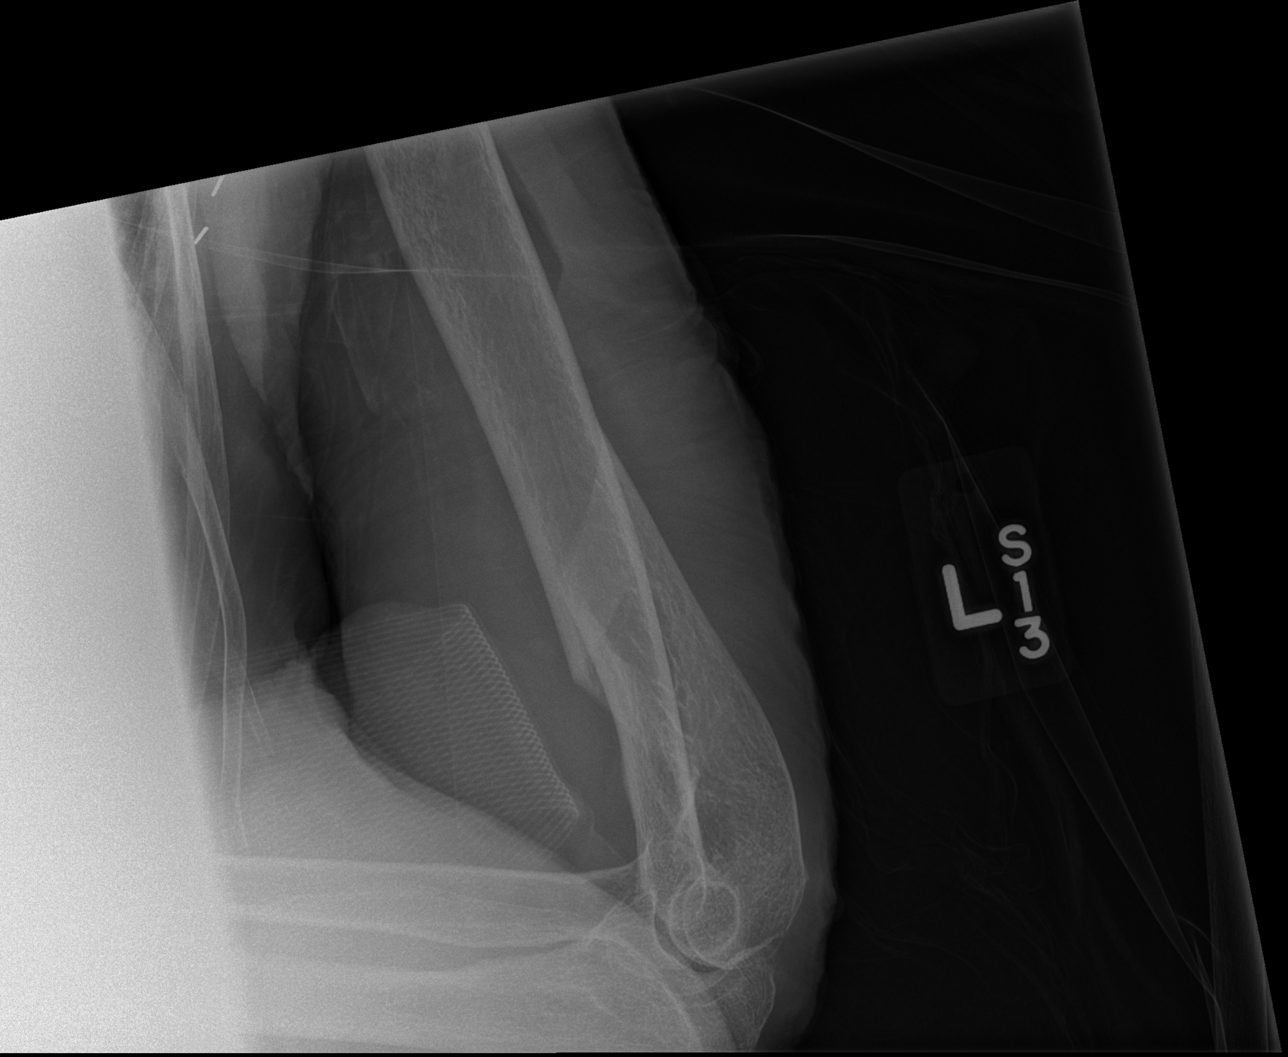

[x elbow lat left (2 of 3)]
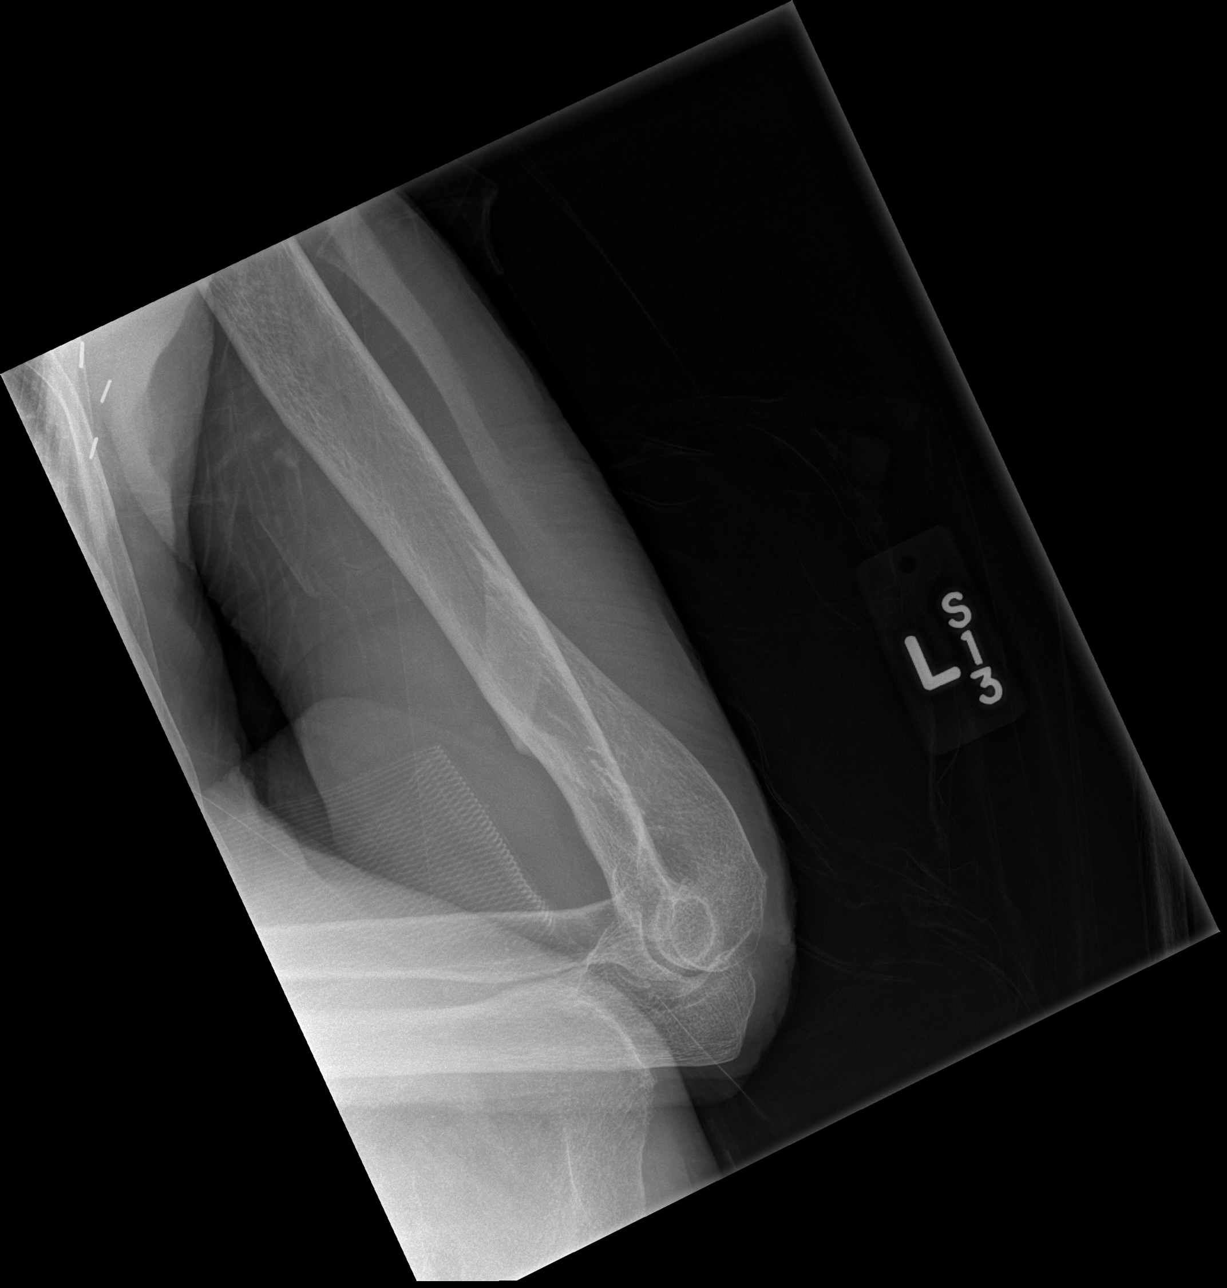

[x elbow lat left (3 of 3)]
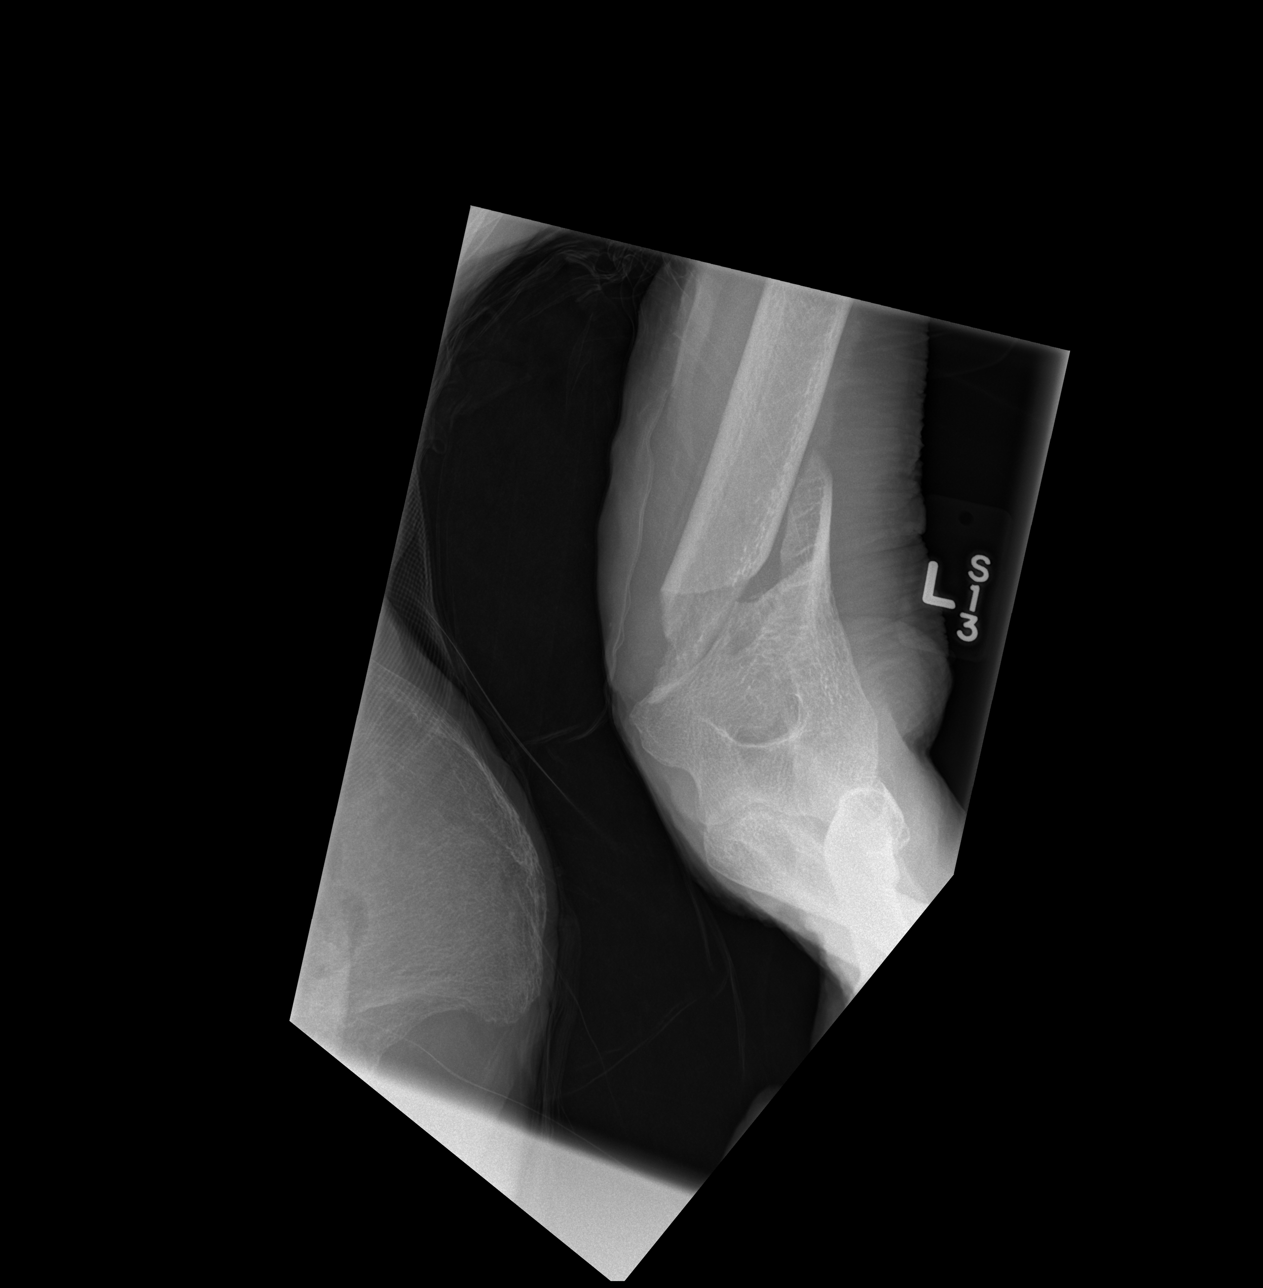

[3 of 3 positions shown; findings below may reference images not displayed]

FINDINGS: RIGHT supracondylar fracture is obliquely oriented through the
distal humeral metaphysis. There is lateral displacement of the
distal fracture fragment and override.
IMPRESSION: Oblique supracondylar fracture with lateral displacement and
override.

## 2018-01-26 IMAGING — CR DG HUMERUS 2V *L*
2 series · 2 of 2 positions shown · non-contrast
Comparison: None.

CLINICAL DATA: Heard a pop moving left arm today

EXAM:
LEFT HUMERUS - 2+ VIEW

[x humerus ap left]
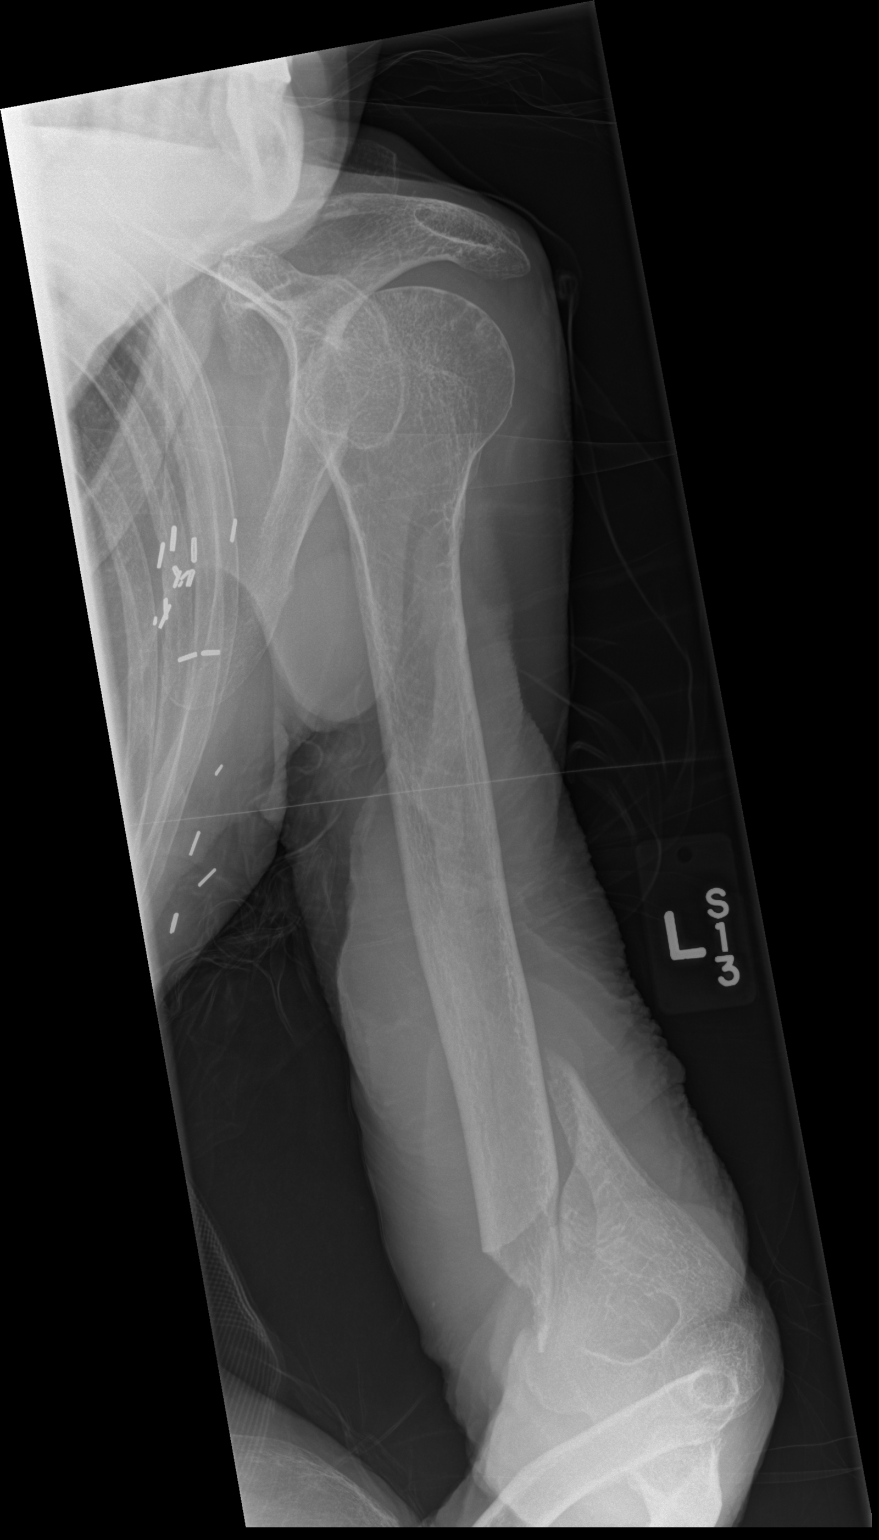

[x humerus lat left]
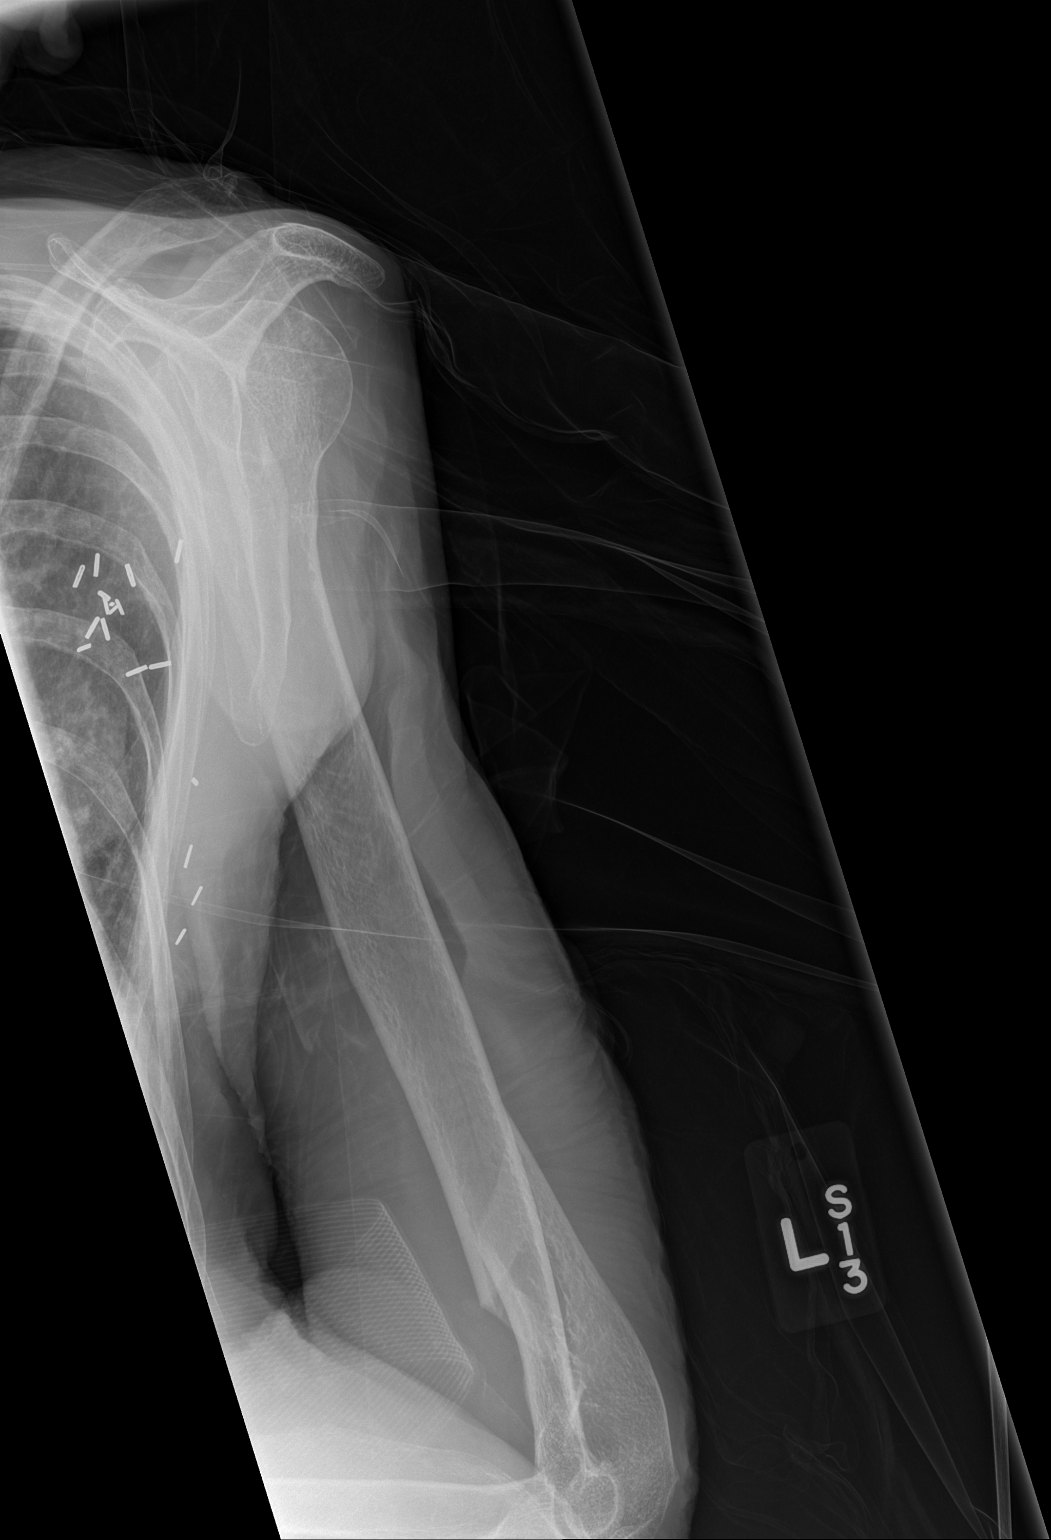

[2 of 2 positions shown; findings below may reference images not displayed]

FINDINGS: Two views of the left kidney measures submitted. There is spiral
displaced fracture in distal shaft of the left humerus.
IMPRESSION: Spiral displaced fracture distal shaft of left humerus.

## 2018-06-09 DEATH — deceased
